# Patient Record
Sex: Male | Born: 1965 | Race: White | Hispanic: No | Marital: Married | Smoking: Current every day smoker
Health system: Southern US, Community
[De-identification: ages and names within clinical notes are randomized; demographics above are authoritative.]

## PROBLEM LIST (undated history)

## (undated) DIAGNOSIS — K0889 Other specified disorders of teeth and supporting structures: Secondary | ICD-10-CM

## (undated) DIAGNOSIS — IMO0001 Reserved for inherently not codable concepts without codable children: Secondary | ICD-10-CM

## (undated) DIAGNOSIS — R519 Headache, unspecified: Secondary | ICD-10-CM

## (undated) DIAGNOSIS — R51 Headache: Secondary | ICD-10-CM

## (undated) DIAGNOSIS — C801 Malignant (primary) neoplasm, unspecified: Secondary | ICD-10-CM

---

## 1999-09-16 HISTORY — PX: OTHER SURGICAL HISTORY: SHX169

## 2012-10-14 ENCOUNTER — Ambulatory Visit: Payer: Self-pay | Admitting: Psychiatry

## 2012-10-16 ENCOUNTER — Ambulatory Visit: Payer: Self-pay | Admitting: Psychiatry

## 2012-10-26 LAB — DRUG SCREEN, URINE
Barbiturates, Ur Screen: NEGATIVE (ref ?–200)
Benzodiazepine, Ur Scrn: NEGATIVE (ref ?–200)
Cocaine Metabolite,Ur ~~LOC~~: NEGATIVE (ref ?–300)
MDMA (Ecstasy)Ur Screen: NEGATIVE (ref ?–500)
Methadone, Ur Screen: NEGATIVE (ref ?–300)
Phencyclidine (PCP) Ur S: NEGATIVE (ref ?–25)

## 2012-11-13 ENCOUNTER — Ambulatory Visit: Payer: Self-pay | Admitting: Psychiatry

## 2012-12-03 LAB — RAPID URINE DRUG SCREEN, HOSP PERFORMED
Amphetamines, Ur Screen: NEGATIVE (ref ?–1000)
Barbiturates, Ur Screen: NEGATIVE (ref ?–200)
Benzodiazepine, Ur Scrn: NEGATIVE (ref ?–200)
Cannabinoid 50 Ng, Ur ~~LOC~~: NEGATIVE (ref ?–50)
Opiate, Ur Screen: NEGATIVE (ref ?–300)

## 2015-04-06 DIAGNOSIS — R109 Unspecified abdominal pain: Secondary | ICD-10-CM | POA: Insufficient documentation

## 2015-04-06 LAB — URINALYSIS COMPLETE WITH MICROSCOPIC (ARMC ONLY)
BACTERIA UA: NONE SEEN
Bilirubin Urine: NEGATIVE
GLUCOSE, UA: NEGATIVE mg/dL
Hgb urine dipstick: NEGATIVE
Ketones, ur: NEGATIVE mg/dL
LEUKOCYTES UA: NEGATIVE
Nitrite: NEGATIVE
PROTEIN: 30 mg/dL — AB
SPECIFIC GRAVITY, URINE: 1.027 (ref 1.005–1.030)
Squamous Epithelial / LPF: NONE SEEN
pH: 6 (ref 5.0–8.0)

## 2015-04-06 LAB — COMPREHENSIVE METABOLIC PANEL
ALBUMIN: 4 g/dL (ref 3.5–5.0)
ALT: 11 U/L — ABNORMAL LOW (ref 17–63)
AST: 15 U/L (ref 15–41)
Alkaline Phosphatase: 134 U/L — ABNORMAL HIGH (ref 38–126)
Anion gap: 9 (ref 5–15)
BUN: 17 mg/dL (ref 6–20)
CHLORIDE: 95 mmol/L — AB (ref 101–111)
CO2: 28 mmol/L (ref 22–32)
CREATININE: 1.23 mg/dL (ref 0.61–1.24)
Calcium: 8.8 mg/dL — ABNORMAL LOW (ref 8.9–10.3)
GFR calc non Af Amer: >60 mL/min (ref >60)
Glucose, Bld: 114 mg/dL — ABNORMAL HIGH (ref 65–99)
POTASSIUM: 4.4 mmol/L (ref 3.5–5.1)
Sodium: 132 mmol/L — ABNORMAL LOW (ref 135–145)
Total Bilirubin: 0.9 mg/dL (ref 0.3–1.2)
Total Protein: 7.8 g/dL (ref 6.5–8.1)

## 2015-04-06 LAB — CBC
HCT: 51.4 % (ref 40.0–52.0)
Hemoglobin: 16.9 g/dL (ref 13.0–18.0)
MCH: 27.6 pg (ref 26.0–34.0)
MCHC: 32.8 g/dL (ref 32.0–36.0)
MCV: 84.1 fL (ref 80.0–100.0)
Platelets: 333 10*3/uL (ref 150–440)
RBC: 6.11 MIL/uL — AB (ref 4.40–5.90)
RDW: 14.8 % — ABNORMAL HIGH (ref 11.5–14.5)
WBC: 13.3 10*3/uL — ABNORMAL HIGH (ref 3.8–10.6)

## 2015-04-06 LAB — LIPASE, BLOOD: Lipase: <10 U/L — ABNORMAL LOW (ref 22–51)

## 2015-04-06 MED ORDER — OXYCODONE-ACETAMINOPHEN 5-325 MG PO TABS
1.0000 | ORAL_TABLET | Freq: Once | ORAL | Status: AC
  Administered 2015-04-06: 1 via ORAL
  Filled 2015-04-06: qty 1

## 2015-04-06 MED ORDER — ONDANSETRON 4 MG PO TBDP
4.0000 mg | ORAL_TABLET | Freq: Once | ORAL | Status: AC | PRN
  Administered 2015-04-06: 4 mg via ORAL
  Filled 2015-04-06: qty 1

## 2015-04-06 NOTE — ED Notes (Signed)
Pt to ED c/o upper abd pain all week. Pt describes pain as burning in sensation.

## 2015-04-07 ENCOUNTER — Emergency Department
Admission: EM | Admit: 2015-04-07 | Discharge: 2015-04-07 | Discharge status: Against Medical Advice | Payer: Self-pay | Attending: Emergency Medicine | Admitting: Emergency Medicine

## 2015-04-09 ENCOUNTER — Telehealth: Payer: Self-pay | Admitting: Emergency Medicine

## 2015-04-09 NOTE — ED Notes (Signed)
Says he is feeling better today after taking laxative and having good bowel movement.  He does not have pcp.  i encouraged him to try to get pcp --he says he has insurance.  i told him to call insurance to see which doctors he can go to.  He agrees.

## 2016-02-06 ENCOUNTER — Emergency Department
Admission: EM | Admit: 2016-02-06 | Discharge: 2016-02-06 | Disposition: A | Discharge status: Home / Self Care | Payer: Self-pay | Attending: Emergency Medicine | Admitting: Emergency Medicine

## 2016-02-06 ENCOUNTER — Encounter: Payer: Self-pay | Admitting: Emergency Medicine

## 2016-02-06 DIAGNOSIS — R1030 Lower abdominal pain, unspecified: Secondary | ICD-10-CM | POA: Insufficient documentation

## 2016-02-06 DIAGNOSIS — Z5321 Procedure and treatment not carried out due to patient leaving prior to being seen by health care provider: Secondary | ICD-10-CM | POA: Insufficient documentation

## 2016-02-06 DIAGNOSIS — F17210 Nicotine dependence, cigarettes, uncomplicated: Secondary | ICD-10-CM | POA: Insufficient documentation

## 2016-02-06 LAB — COMPREHENSIVE METABOLIC PANEL
ALBUMIN: 3.1 g/dL — AB (ref 3.5–5.0)
ALK PHOS: 93 U/L (ref 38–126)
ALT: 14 U/L — AB (ref 17–63)
AST: 17 U/L (ref 15–41)
Anion gap: 7 (ref 5–15)
BUN: 11 mg/dL (ref 6–20)
CALCIUM: 8.7 mg/dL — AB (ref 8.9–10.3)
CO2: 27 mmol/L (ref 22–32)
CREATININE: 1.1 mg/dL (ref 0.61–1.24)
Chloride: 103 mmol/L (ref 101–111)
GFR calc non Af Amer: >60 mL/min (ref >60)
GLUCOSE: 103 mg/dL — AB (ref 65–99)
Potassium: 4.5 mmol/L (ref 3.5–5.1)
Sodium: 137 mmol/L (ref 135–145)
Total Bilirubin: 0.1 mg/dL — ABNORMAL LOW (ref 0.3–1.2)
Total Protein: 6.8 g/dL (ref 6.5–8.1)

## 2016-02-06 LAB — URINALYSIS COMPLETE WITH MICROSCOPIC (ARMC ONLY)
Bacteria, UA: NONE SEEN
Bilirubin Urine: NEGATIVE
Glucose, UA: NEGATIVE mg/dL
Hgb urine dipstick: NEGATIVE
Ketones, ur: NEGATIVE mg/dL
Leukocytes, UA: NEGATIVE
Nitrite: NEGATIVE
PH: 5 (ref 5.0–8.0)
PROTEIN: NEGATIVE mg/dL
RBC / HPF: NONE SEEN RBC/hpf (ref 0–5)
Specific Gravity, Urine: 1.017 (ref 1.005–1.030)

## 2016-02-06 LAB — CBC
HCT: 39.1 % — ABNORMAL LOW (ref 40.0–52.0)
Hemoglobin: 13.1 g/dL (ref 13.0–18.0)
MCH: 27 pg (ref 26.0–34.0)
MCHC: 33.5 g/dL (ref 32.0–36.0)
MCV: 80.5 fL (ref 80.0–100.0)
PLATELETS: 455 10*3/uL — AB (ref 150–440)
RBC: 4.86 MIL/uL (ref 4.40–5.90)
RDW: 14.9 % — ABNORMAL HIGH (ref 11.5–14.5)
WBC: 9.7 10*3/uL (ref 3.8–10.6)

## 2016-02-06 LAB — LIPASE, BLOOD: Lipase: 16 U/L (ref 11–51)

## 2016-02-06 NOTE — ED Notes (Signed)
Patient ambulatory to triage with steady gait, without difficulty or distress noted; pt reports lower abd pain x month with no accomp symptoms; last BM this am

## 2016-02-18 ENCOUNTER — Inpatient Hospital Stay
Admission: EM | Admit: 2016-02-18 | Discharge: 2016-02-20 | DRG: 375 | Disposition: A | Discharge status: Home / Self Care | Payer: Commercial Managed Care - PPO | Attending: Internal Medicine | Admitting: Internal Medicine

## 2016-02-18 ENCOUNTER — Emergency Department: Payer: Commercial Managed Care - PPO

## 2016-02-18 ENCOUNTER — Encounter: Payer: Self-pay | Admitting: Emergency Medicine

## 2016-02-18 DIAGNOSIS — F419 Anxiety disorder, unspecified: Secondary | ICD-10-CM | POA: Diagnosis present

## 2016-02-18 DIAGNOSIS — C801 Malignant (primary) neoplasm, unspecified: Secondary | ICD-10-CM

## 2016-02-18 DIAGNOSIS — R140 Abdominal distension (gaseous): Secondary | ICD-10-CM | POA: Diagnosis present

## 2016-02-18 DIAGNOSIS — Z8249 Family history of ischemic heart disease and other diseases of the circulatory system: Secondary | ICD-10-CM

## 2016-02-18 DIAGNOSIS — Z800 Family history of malignant neoplasm of digestive organs: Secondary | ICD-10-CM

## 2016-02-18 DIAGNOSIS — R451 Restlessness and agitation: Secondary | ICD-10-CM | POA: Diagnosis present

## 2016-02-18 DIAGNOSIS — F17210 Nicotine dependence, cigarettes, uncomplicated: Secondary | ICD-10-CM | POA: Diagnosis present

## 2016-02-18 DIAGNOSIS — Z807 Family history of other malignant neoplasms of lymphoid, hematopoietic and related tissues: Secondary | ICD-10-CM

## 2016-02-18 DIAGNOSIS — R1900 Intra-abdominal and pelvic swelling, mass and lump, unspecified site: Secondary | ICD-10-CM | POA: Diagnosis not present

## 2016-02-18 DIAGNOSIS — R188 Other ascites: Secondary | ICD-10-CM | POA: Diagnosis present

## 2016-02-18 DIAGNOSIS — C786 Secondary malignant neoplasm of retroperitoneum and peritoneum: Secondary | ICD-10-CM | POA: Diagnosis present

## 2016-02-18 DIAGNOSIS — K5900 Constipation, unspecified: Secondary | ICD-10-CM | POA: Diagnosis present

## 2016-02-18 DIAGNOSIS — R109 Unspecified abdominal pain: Secondary | ICD-10-CM | POA: Diagnosis not present

## 2016-02-18 LAB — COMPREHENSIVE METABOLIC PANEL
ALBUMIN: 2.8 g/dL — AB (ref 3.5–5.0)
ALT: 18 U/L (ref 17–63)
ANION GAP: 9 (ref 5–15)
AST: 16 U/L (ref 15–41)
Alkaline Phosphatase: 117 U/L (ref 38–126)
BUN: 11 mg/dL (ref 6–20)
CHLORIDE: 99 mmol/L — AB (ref 101–111)
CO2: 26 mmol/L (ref 22–32)
Calcium: 8.3 mg/dL — ABNORMAL LOW (ref 8.9–10.3)
Creatinine, Ser: 1.14 mg/dL (ref 0.61–1.24)
GFR calc non Af Amer: >60 mL/min (ref >60)
Glucose, Bld: 101 mg/dL — ABNORMAL HIGH (ref 65–99)
POTASSIUM: 4.5 mmol/L (ref 3.5–5.1)
Sodium: 134 mmol/L — ABNORMAL LOW (ref 135–145)
Total Bilirubin: 0.7 mg/dL (ref 0.3–1.2)
Total Protein: 6.5 g/dL (ref 6.5–8.1)

## 2016-02-18 LAB — CBC
HCT: 39.4 % — ABNORMAL LOW (ref 40.0–52.0)
HEMOGLOBIN: 13.1 g/dL (ref 13.0–18.0)
MCH: 26.5 pg (ref 26.0–34.0)
MCHC: 33.2 g/dL (ref 32.0–36.0)
MCV: 79.7 fL — ABNORMAL LOW (ref 80.0–100.0)
PLATELETS: 453 10*3/uL — AB (ref 150–440)
RBC: 4.94 MIL/uL (ref 4.40–5.90)
RDW: 14.9 % — AB (ref 11.5–14.5)
WBC: 8.7 10*3/uL (ref 3.8–10.6)

## 2016-02-18 LAB — PROTIME-INR
INR: 1.35
PROTHROMBIN TIME: 16.8 s — AB (ref 11.4–15.0)

## 2016-02-18 LAB — LIPASE, BLOOD: Lipase: 14 U/L (ref 11–51)

## 2016-02-18 LAB — URINALYSIS COMPLETE WITH MICROSCOPIC (ARMC ONLY)
Bacteria, UA: NONE SEEN
Bilirubin Urine: NEGATIVE
GLUCOSE, UA: NEGATIVE mg/dL
HGB URINE DIPSTICK: NEGATIVE
KETONES UR: NEGATIVE mg/dL
LEUKOCYTES UA: NEGATIVE
NITRITE: NEGATIVE
Protein, ur: NEGATIVE mg/dL
SPECIFIC GRAVITY, URINE: 1.046 — AB (ref 1.005–1.030)
Squamous Epithelial / LPF: NONE SEEN
pH: 5 (ref 5.0–8.0)

## 2016-02-18 LAB — APTT: APTT: 34 s (ref 24–36)

## 2016-02-18 MED ORDER — DIATRIZOATE MEGLUMINE & SODIUM 66-10 % PO SOLN
15.0000 mL | Freq: Once | ORAL | Status: AC
  Administered 2016-02-18: 15 mL via ORAL

## 2016-02-18 MED ORDER — HEPARIN SODIUM (PORCINE) 5000 UNIT/ML IJ SOLN
5000.0000 [IU] | Freq: Three times a day (TID) | INTRAMUSCULAR | Status: DC
  Administered 2016-02-18 – 2016-02-20 (×5): 5000 [IU] via SUBCUTANEOUS
  Filled 2016-02-18 (×5): qty 1

## 2016-02-18 MED ORDER — NICOTINE 21 MG/24HR TD PT24
21.0000 mg | MEDICATED_PATCH | Freq: Every day | TRANSDERMAL | Status: DC
  Administered 2016-02-18 – 2016-02-20 (×3): 21 mg via TRANSDERMAL
  Filled 2016-02-18 (×3): qty 1

## 2016-02-18 MED ORDER — ACETAMINOPHEN 325 MG PO TABS: 650.0000 mg | ORAL_TABLET | Freq: Four times a day (QID) | ORAL | Status: DC | PRN

## 2016-02-18 MED ORDER — OXYCODONE HCL 5 MG PO TABS
5.0000 mg | ORAL_TABLET | ORAL | Status: DC | PRN
  Administered 2016-02-18 – 2016-02-19 (×4): 5 mg via ORAL
  Filled 2016-02-18 (×4): qty 1

## 2016-02-18 MED ORDER — ONDANSETRON HCL 4 MG/2ML IJ SOLN
4.0000 mg | Freq: Four times a day (QID) | INTRAMUSCULAR | Status: DC | PRN
  Administered 2016-02-18: 4 mg via INTRAVENOUS
  Filled 2016-02-18: qty 2

## 2016-02-18 MED ORDER — IOPAMIDOL (ISOVUE-300) INJECTION 61%
100.0000 mL | Freq: Once | INTRAVENOUS | Status: AC | PRN
  Administered 2016-02-18: 100 mL via INTRAVENOUS

## 2016-02-18 MED ORDER — ACETAMINOPHEN 650 MG RE SUPP: 650.0000 mg | Freq: Four times a day (QID) | RECTAL | Status: DC | PRN

## 2016-02-18 MED ORDER — MORPHINE SULFATE (PF) 2 MG/ML IV SOLN
2.0000 mg | INTRAVENOUS | Status: DC | PRN
  Administered 2016-02-18 (×2): 2 mg via INTRAVENOUS
  Filled 2016-02-18 (×2): qty 1

## 2016-02-18 MED ORDER — ONDANSETRON HCL 4 MG/2ML IJ SOLN
4.0000 mg | Freq: Once | INTRAMUSCULAR | Status: AC
  Administered 2016-02-18: 4 mg via INTRAVENOUS
  Filled 2016-02-18: qty 2

## 2016-02-18 MED ORDER — MORPHINE SULFATE (PF) 4 MG/ML IV SOLN
4.0000 mg | Freq: Once | INTRAVENOUS | Status: AC
  Administered 2016-02-18: 4 mg via INTRAVENOUS
  Filled 2016-02-18: qty 1

## 2016-02-18 MED ORDER — ONDANSETRON HCL 4 MG PO TABS: 4.0000 mg | ORAL_TABLET | Freq: Four times a day (QID) | ORAL | Status: DC | PRN

## 2016-02-18 NOTE — ED Provider Notes (Signed)
[facility] Emergency Department Provider Note  ____________________________________________    I have reviewed the triage vital signs and the nursing notes.   HISTORY  Chief Complaint Abdominal Pain    HPI Andrew Ibarra is a 50 y.o. male who presents with abdominal pain for approximately last 2 months. He notes worsening distention of his abdomen feeling of tightness. He denies nausea or vomiting. He is having stools although does note problems with constipation which is unusual for him. Denies fevers or chills. No recent travel. He reports the pain is moderate to severe but better if he is resting.     History reviewed. No pertinent past medical history.  There are no active problems to display for this patient.   History reviewed. No pertinent past surgical history.  No current outpatient prescriptions on file.  Allergies Review of patient's allergies indicates no known allergies.  No family history on file.  Social History Social History  Substance Use Topics  . Smoking status: Current Every Day Smoker -- 0.50 packs/day    Types: Cigarettes  . Smokeless tobacco: None  . Alcohol Use: No    Review of Systems  Constitutional: Negative for fever. Eyes: Negative for redness ENT: Negative for sore throat Cardiovascular: Negative for chest pain Respiratory: Negative for shortness of breath. Gastrointestinal: As above Genitourinary: Negative for dysuria. Musculoskeletal: Negative for back pain. Skin: Negative for rash. Neurological: Negative for focal weakness Psychiatric: Positive for anxiety  ____________________________________________   PHYSICAL EXAM:  VITAL SIGNS: ED Triage Vitals  Enc Vitals Group     BP 02/18/16 1053 135/93 mmHg     Pulse Rate 02/18/16 1053 88     Resp 02/18/16 1053 22     Temp 02/18/16 1053 98.1 F (36.7 C)     Temp Source 02/18/16 1053 Oral     SpO2 02/18/16 1053 98 %     Weight 02/18/16 1053 260  lb (117.935 kg)     Height 02/18/16 1053 5\' 9"  (1.753 m)     Head Cir --      Peak Flow --      Pain Score 02/18/16 1054 7     Pain Loc --      Pain Edu? --      Excl. in [place]? --      Constitutional: Alert and oriented. Well appearing and in no distress.  Eyes: Conjunctivae are normal. No erythema or injection ENT   Head: Normocephalic and atraumatic.   Mouth/Throat: Mucous membranes are moist. Cardiovascular: Normal rate, regular rhythm. Normal and symmetric distal pulses are present in the upper extremities. No murmurs or rubs  Respiratory: Normal respiratory effort without tachypnea nor retractions. Breath sounds are clear and equal bilaterally.  Gastrointestinal: Distended abdomen, positive fluid wave, diffusely tender, non-peritoneal.. There is no CVA tenderness. Genitourinary: deferred Musculoskeletal: Nontender with normal range of motion in all extremities. No lower extremity tenderness nor edema. Neurologic:  Normal speech and language. No gross focal neurologic deficits are appreciated. Skin:  Skin is warm, dry and intact. No rash noted. Psychiatric: Mood and affect are normal. Patient exhibits appropriate insight and judgment.  ____________________________________________    LABS (pertinent positives/negatives)  Labs Reviewed  CBC - Abnormal; Notable for the following:    HCT 39.4 (*)    MCV 79.7 (*)    RDW 14.9 (*)    Platelets 453 (*)    All other components within normal limits  COMPREHENSIVE METABOLIC PANEL - Abnormal; Notable for the following:  Sodium 134 (*)    Chloride 99 (*)    Glucose, Bld 101 (*)    Calcium 8.3 (*)    Albumin 2.8 (*)    All other components within normal limits  LIPASE, BLOOD  URINALYSIS COMPLETEWITH MICROSCOPIC ([facility] ONLY)    ____________________________________________   EKG  ED ECG REPORT I, Lavonia Drafts, the attending physician, personally viewed and interpreted this ECG.  Date: 02/18/2016 EKG Time: 11:43  AM Rate: 92 Rhythm: normal sinus rhythm QRS Axis: normal Intervals: Slightly prolonged QTC ST/T Wave abnormalities: normal Conduction Disturbances: none    ____________________________________________    RADIOLOGY  Called by radiologist, concern for peritoneal carcinomatosis on CT scan  ____________________________________________   PROCEDURES  Procedure(s) performed: none  Critical Care performed: none  ____________________________________________   INITIAL IMPRESSION / ASSESSMENT AND PLAN / ED COURSE  Pertinent labs & imaging results that were available during my care of the patient were reviewed by me and considered in my medical decision making (see chart for details).  Patient with recent abdomen, diffusely tender. X-rays are unremarkable. We will give morphine and Zofran IV as he continues to have discomfort. We will obtain CT abdomen and pelvis  ----------------------------------------- 2:02 PM on 02/18/2016 -----------------------------------------  Discussed CT findings with patient and his family, they are aware this is very likely cancer. Discussed with Dr. Grayland Ormond of oncology who will see the patient the hospital.  ____________________________________________   FINAL CLINICAL IMPRESSION(S) / ED DIAGNOSES  Final diagnoses:  Abdominal bloating  Peritoneal carcinomatosis ([place])  Ascites          Lavonia Drafts, MD 02/18/16 1404

## 2016-02-18 NOTE — ED Notes (Signed)
Pt presents with lower abd pain x2 mths.

## 2016-02-18 NOTE — H&P (Signed)
[place] [facility] [place] NAME: Andrew Ibarra    MR#:  [id number]  DATE OF BIRTH:  [birth year]  DATE OF ADMISSION:  02/18/2016  PRIMARY CARE PHYSICIAN: Nathaneil Canary, PA-C   REQUESTING/REFERRING PHYSICIAN: Dr. Lavonia Drafts  CHIEF COMPLAINT:   Chief Complaint  Patient presents with  . Abdominal Pain    HISTORY OF PRESENT ILLNESS:  Andrew Ibarra  is a 50 y.o. male with no significant past medical history comes to emergency room secondary to worsening abdominal distention and also abdominal pain. Abdominal distention has been going on for almost a month now. Gradually increasing in size. He feels like gas is trapped in his lower quadrants. Has had irregular bowel movements for almost 2 years now. Mostly constipated. Denies any rectal bleeding or dark stools or blood in the stools. No nausea or vomiting. Worsening tightness in his belly and pain made him come to the emergency room today. Labs are within normal limits. CT of the abdomen showing large volume ascites with extensive peritoneal and omental nodularity concerning for metastases and thickening in ascending colon.   PAST MEDICAL HISTORY:  History reviewed. No pertinent past medical history.  PAST SURGICAL HISTORY:   Past Surgical History  Procedure Laterality Date  . Right foot surgery      SOCIAL HISTORY:   Social History  Substance Use Topics  . Smoking status: Current Every Day Smoker -- 0.50 packs/day    Types: Cigarettes  . Smokeless tobacco: Not on file     Comment: has more than 30 pack years history  . Alcohol Use: No    FAMILY HISTORY:   Family History  Problem Relation Age of Onset  . Pancreatic cancer Maternal Grandfather   . Lymphoma Maternal Grandmother   . CAD Father     DRUG ALLERGIES:  No Known Allergies  REVIEW OF SYSTEMS:   Review of Systems  Constitutional: Positive for malaise/fatigue. Negative for fever, chills and weight loss.   HENT: Negative for ear discharge, ear pain, hearing loss and nosebleeds.   Eyes: Negative for blurred vision, double vision and photophobia.  Respiratory: Positive for shortness of breath. Negative for cough, hemoptysis and wheezing.   Cardiovascular: Negative for chest pain, palpitations, orthopnea and leg swelling.  Gastrointestinal: Positive for abdominal pain and constipation. Negative for heartburn, nausea, vomiting, diarrhea and melena.  Genitourinary: Negative for dysuria, urgency, frequency and hematuria.  Musculoskeletal: Negative for myalgias and neck pain.  Skin: Negative for rash.  Neurological: Negative for dizziness, tingling, sensory change, speech change, focal weakness and headaches.  Endo/Heme/Allergies: Does not bruise/bleed easily.  Psychiatric/Behavioral: Negative for depression.    MEDICATIONS AT HOME:   Prior to Admission medications   Not on File      VITAL SIGNS:  Blood pressure 154/86, pulse 89, temperature 98.1 F (36.7 C), temperature source Oral, resp. rate 13, height 5\' 9"  (1.753 m), weight 117.935 kg (260 lb), SpO2 98 %.  PHYSICAL EXAMINATION:   Physical Exam  GENERAL:  50 y.o.-year-old obese patient lying in the bed with no acute distress.  EYES: Pupils equal, round, reactive to light and accommodation. No scleral icterus. Extraocular muscles intact.  HEENT: Head atraumatic, normocephalic. Oropharynx and nasopharynx clear.  NECK:  Supple, no jugular venous distention. No thyroid enlargement, no tenderness.  LUNGS: Normal breath sounds bilaterally, no wheezing, rales,rhonchi or crepitation. No use of accessory muscles of respiration. Decreased bibasilar breath sounds. CARDIOVASCULAR: S1, S2 normal. No murmurs, rubs, or gallops.  ABDOMEN: Very distended abdomen, unable to palpate any organs, tight with discomfort on palpation. Bowel sounds present.  EXTREMITIES: No pedal edema, cyanosis, or clubbing.  NEUROLOGIC: Cranial nerves II through XII are  intact. Muscle strength 5/5 in all extremities. Sensation intact. Gait not checked.  PSYCHIATRIC: The patient is alert and oriented x 3.  SKIN: No obvious rash, lesion, or ulcer.   LABORATORY PANEL:   CBC  Recent Labs Lab 02/18/16 1100  WBC 8.7  HGB 13.1  HCT 39.4*  PLT 453*   ------------------------------------------------------------------------------------------------------------------  Chemistries   Recent Labs Lab 02/18/16 1100  NA 134*  K 4.5  CL 99*  CO2 26  GLUCOSE 101*  BUN 11  CREATININE 1.14  CALCIUM 8.3*  AST 16  ALT 18  ALKPHOS 117  BILITOT 0.7   ------------------------------------------------------------------------------------------------------------------  Cardiac Enzymes No results for input(s): TROPONINI in the last 168 hours. ------------------------------------------------------------------------------------------------------------------  RADIOLOGY:  Ct Abdomen Pelvis W Contrast  02/18/2016  CLINICAL DATA:  Shortness of breath, abdominal distention for 1 month. History of ventral hernia repair EXAM: CT ABDOMEN AND PELVIS WITH CONTRAST TECHNIQUE: Multidetector CT imaging of the abdomen and pelvis was performed using the standard protocol following bolus administration of intravenous contrast. CONTRAST:  138mL ISOVUE-300 IOPAMIDOL (ISOVUE-300) INJECTION 61% COMPARISON:  Plain films 02/18/2016 FINDINGS: Lower chest: Linear atelectasis in the lung bases. Heart is normal size. No effusions. Hepatobiliary: No focal hepatic abnormality. Gallbladder unremarkable. Pancreas: No focal abnormality or ductal dilatation. Spleen: No focal abnormality.  Normal size. Adrenals/Urinary Tract: No adrenal abnormality. No focal renal abnormality. No stones or hydronephrosis. Urinary bladder is unremarkable. Stomach/Bowel: There is a questionable area of abnormal wall thickening within the ascending colon, best seen on axial image 48 and coronal image 119. Given the  peritoneal findings described below, cannot exclude colonic malignancy. Vascular/Lymphatic: Aorta and iliac vessels are normal caliber. Shotty retroperitoneal lymph nodes. Reproductive: No visible focal abnormality. Other: Large volume ascites in the abdomen and pelvis. There is extensive peritoneal and omental nodularity throughout the abdomen and pelvis. 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 Musculoskeletal: No acute bony abnormality or focal bone lesion. IMPRESSION: Large volume ascites with extensive peritoneal and omental nodularity concerning for peritoneal carcinomatosis. There is an area of apparent abnormal wall thickening within the ascending colon which could reflect colon cancer and source of peritoneal disease. These results were called by telephone at the time of interpretation on 02/18/2016 at 1:46 pm to Dr. Lavonia Drafts , who verbally acknowledged these results. Electronically Signed   By: Rolm Baptise M.D.   On: 02/18/2016 13:47   Dg Abd 2 Views  02/18/2016  CLINICAL DATA:  Lower abdominal pain for 1 month. Abdominal distention increased pain over the past 3 days. EXAM: ABDOMEN - 2 VIEW COMPARISON:  None. FINDINGS: The bowel gas pattern is normal. There is no evidence of free air. No radio-opaque calculi or other significant radiographic abnormality is seen. IMPRESSION: No acute abnormality. Electronically Signed   By: Inge Rise M.D.   On: 02/18/2016 10:49    EKG:   Orders placed or performed during the hospital encounter of 02/18/16  . EKG 12-Lead  . EKG 12-Lead  . ED EKG  . ED EKG    IMPRESSION AND PLAN:   Andrew Ibarra  is a 50 y.o. male with no significant past medical history comes to emergency room secondary to worsening abdominal distention and also abdominal pain.  #1 Abdominal distention- large volume ascites- could be malignant ascites - peritoneal carcinomatosis and ascending colon  thickening- no previous colonoscopy, no family h/o colon cancer - US  guided paracentesis requested- gram stain, cultures and Cytology - CEA and CA 19-9 ordered - GI and oncology consults - pain and nausea meds  #2 Tobacco use disorder- nicotine patch  #3 DVT Prophylaxis- on SQ heparin, if doesn't need any procedures- change to lovenox   All the records are reviewed and case discussed with ED provider. Management plans discussed with the patient, family and they are in agreement.  CODE STATUS: Full Code  TOTAL TIME TAKING CARE OF THIS PATIENT: 50 minutes.    Makalya Nave M.D on 02/18/2016 at 3:06 PM  Between 7am to 6pm - Pager - [phone]  After 6pm go to [email or web address] - password [email or web address] [facility]  Office  [phone]  CC: Primary care physician; Nathaneil Canary, PA-C

## 2016-02-19 ENCOUNTER — Inpatient Hospital Stay: Payer: Commercial Managed Care - PPO

## 2016-02-19 DIAGNOSIS — Z79899 Other long term (current) drug therapy: Secondary | ICD-10-CM

## 2016-02-19 DIAGNOSIS — Z800 Family history of malignant neoplasm of digestive organs: Secondary | ICD-10-CM

## 2016-02-19 DIAGNOSIS — R109 Unspecified abdominal pain: Secondary | ICD-10-CM

## 2016-02-19 DIAGNOSIS — Z87891 Personal history of nicotine dependence: Secondary | ICD-10-CM

## 2016-02-19 DIAGNOSIS — Z807 Family history of other malignant neoplasms of lymphoid, hematopoietic and related tissues: Secondary | ICD-10-CM

## 2016-02-19 DIAGNOSIS — R419 Unspecified symptoms and signs involving cognitive functions and awareness: Secondary | ICD-10-CM

## 2016-02-19 DIAGNOSIS — R140 Abdominal distension (gaseous): Secondary | ICD-10-CM

## 2016-02-19 DIAGNOSIS — R188 Other ascites: Secondary | ICD-10-CM

## 2016-02-19 DIAGNOSIS — R1900 Intra-abdominal and pelvic swelling, mass and lump, unspecified site: Secondary | ICD-10-CM

## 2016-02-19 LAB — BASIC METABOLIC PANEL
ANION GAP: 7 (ref 5–15)
BUN: 10 mg/dL (ref 6–20)
CHLORIDE: 101 mmol/L (ref 101–111)
CO2: 24 mmol/L (ref 22–32)
CREATININE: 0.95 mg/dL (ref 0.61–1.24)
Calcium: 8 mg/dL — ABNORMAL LOW (ref 8.9–10.3)
GFR calc non Af Amer: >60 mL/min (ref >60)
Glucose, Bld: 100 mg/dL — ABNORMAL HIGH (ref 65–99)
POTASSIUM: 4.3 mmol/L (ref 3.5–5.1)
SODIUM: 132 mmol/L — AB (ref 135–145)

## 2016-02-19 LAB — AMYLASE, BODY FLUID: AMYLASE FL: 8 U/L

## 2016-02-19 LAB — PROTEIN, BODY FLUID: TOTAL PROTEIN, FLUID: 4 g/dL

## 2016-02-19 LAB — CEA: CEA: 3.1 ng/mL (ref 0.0–4.7)

## 2016-02-19 LAB — BODY FLUID CELL COUNT WITH DIFFERENTIAL
Lymphs, Fluid: 35 %
MONOCYTE-MACROPHAGE-SEROUS FLUID: 59 %
Neutrophil Count, Fluid: 6 %
WBC FLUID: 979 uL

## 2016-02-19 LAB — CBC
HCT: 34.9 % — ABNORMAL LOW (ref 40.0–52.0)
HEMOGLOBIN: 11.8 g/dL — AB (ref 13.0–18.0)
MCH: 27.2 pg (ref 26.0–34.0)
MCHC: 33.7 g/dL (ref 32.0–36.0)
MCV: 80.9 fL (ref 80.0–100.0)
PLATELETS: 430 10*3/uL (ref 150–440)
RBC: 4.32 MIL/uL — AB (ref 4.40–5.90)
RDW: 14.5 % (ref 11.5–14.5)
WBC: 7.3 10*3/uL (ref 3.8–10.6)

## 2016-02-19 LAB — LACTATE DEHYDROGENASE, PLEURAL OR PERITONEAL FLUID: LD, Fluid: 178 U/L — ABNORMAL HIGH (ref 3–23)

## 2016-02-19 LAB — GLUCOSE, PERITONEAL FLUID: Glucose, Peritoneal Fluid: 95 mg/dL

## 2016-02-19 LAB — CA 19-9 (SERIAL): CA 19-9: 13 U/mL (ref 0–35)

## 2016-02-19 MED ORDER — ALBUMIN HUMAN 25 % IV SOLN
12.5000 g | Freq: Once | INTRAVENOUS | Status: AC | PRN
  Administered 2016-02-19: 12.5 g via INTRAVENOUS
  Filled 2016-02-19: qty 50

## 2016-02-19 MED ORDER — LORAZEPAM 0.5 MG PO TABS
0.5000 mg | ORAL_TABLET | ORAL | Status: AC
Start: 2016-02-19 — End: 2016-02-19
  Administered 2016-02-19: 0.5 mg via ORAL
  Filled 2016-02-19: qty 1

## 2016-02-19 MED ORDER — OXYCODONE HCL 5 MG PO TABS
10.0000 mg | ORAL_TABLET | Freq: Once | ORAL | Status: AC
  Administered 2016-02-19: 10 mg via ORAL
  Filled 2016-02-19: qty 2

## 2016-02-19 MED ORDER — LORAZEPAM 0.5 MG PO TABS
0.5000 mg | ORAL_TABLET | Freq: Once | ORAL | Status: AC
  Administered 2016-02-19: 0.5 mg via ORAL
  Filled 2016-02-19: qty 1

## 2016-02-19 NOTE — Progress Notes (Signed)
[place] [facility] [place] NAME: Andrew Ibarra    MR#:  [id number]  DATE OF BIRTH:  19-Oct-1965  SUBJECTIVE:  CHIEF COMPLAINT:   Chief Complaint  Patient presents with  . Abdominal Pain   -Very restless and anxious about his paracentesis. -Family worried about the diagnosis. Scheduled for paracentesis today. Complaints of abdominal pain  REVIEW OF SYSTEMS:  Review of Systems  Constitutional: Negative for fever and chills.  HENT: Negative for ear discharge, ear pain and tinnitus.   Respiratory: Negative for cough, shortness of breath and wheezing.   Cardiovascular: Negative for chest pain and palpitations.  Gastrointestinal: Positive for abdominal pain. Negative for nausea, vomiting, diarrhea and constipation.  Genitourinary: Negative for dysuria, urgency and frequency.  Musculoskeletal: Negative for myalgias.  Neurological: Negative for dizziness, speech change, focal weakness, seizures and headaches.  Psychiatric/Behavioral: Negative for depression. The patient is nervous/anxious.     DRUG ALLERGIES:  No Known Allergies  VITALS:  Blood pressure 156/89, pulse 93, temperature 97.7 F (36.5 C), temperature source Oral, resp. rate 22, height 5\' 9"  (1.753 m), weight 117.935 kg (260 lb), SpO2 94 %.  PHYSICAL EXAMINATION:  Physical Exam  GENERAL: 50 y.o.-year-old obese patient lying in the bed with no acute distress.  EYES: Pupils equal, round, reactive to light and accommodation. No scleral icterus. Extraocular muscles intact.  HEENT: Head atraumatic, normocephalic. Oropharynx and nasopharynx clear.  NECK: Supple, no jugular venous distention. No thyroid enlargement, no tenderness.  LUNGS: Normal breath sounds bilaterally, no wheezing, rales,rhonchi or crepitation. No use of accessory muscles of respiration. Decreased bibasilar breath sounds. CARDIOVASCULAR: S1, S2 normal. No murmurs, rubs, or gallops.  ABDOMEN: Very distended  abdomen, unable to palpate any organs, tight with discomfort on palpation. Bowel sounds present.  EXTREMITIES: No pedal edema, cyanosis, or clubbing.  NEUROLOGIC: Cranial nerves II through XII are intact. Muscle strength 5/5 in all extremities. Sensation intact. Gait not checked.  PSYCHIATRIC: The patient is alert and oriented x 3.  SKIN: No obvious rash, lesion, or ulcer.    LABORATORY PANEL:   CBC  Recent Labs Lab 02/19/16 0455  WBC 7.3  HGB 11.8*  HCT 34.9*  PLT 430   ------------------------------------------------------------------------------------------------------------------  Chemistries   Recent Labs Lab 02/18/16 1100 02/19/16 0455  NA 134* 132*  K 4.5 4.3  CL 99* 101  CO2 26 24  GLUCOSE 101* 100*  BUN 11 10  CREATININE 1.14 0.95  CALCIUM 8.3* 8.0*  AST 16  --   ALT 18  --   ALKPHOS 117  --   BILITOT 0.7  --    ------------------------------------------------------------------------------------------------------------------  Cardiac Enzymes No results for input(s): TROPONINI in the last 168 hours. ------------------------------------------------------------------------------------------------------------------  RADIOLOGY:  Ct Abdomen Pelvis W Contrast  02/18/2016  CLINICAL DATA:  Shortness of breath, abdominal distention for 1 month. History of ventral hernia repair EXAM: CT ABDOMEN AND PELVIS WITH CONTRAST TECHNIQUE: Multidetector CT imaging of the abdomen and pelvis was performed using the standard protocol following bolus administration of intravenous contrast. CONTRAST:  159mL ISOVUE-300 IOPAMIDOL (ISOVUE-300) INJECTION 61% COMPARISON:  Plain films 02/18/2016 FINDINGS: Lower chest: Linear atelectasis in the lung bases. Heart is normal size. No effusions. Hepatobiliary: No focal hepatic abnormality. Gallbladder unremarkable. Pancreas: No focal abnormality or ductal dilatation. Spleen: No focal abnormality.  Normal size. Adrenals/Urinary Tract: No  adrenal abnormality. No focal renal abnormality. No stones or hydronephrosis. Urinary bladder is unremarkable. Stomach/Bowel: There is a questionable area of abnormal wall thickening within the  ascending colon, best seen on axial image 48 and coronal image 119. Given the peritoneal findings described below, cannot exclude colonic malignancy. Vascular/Lymphatic: Aorta and iliac vessels are normal caliber. Shotty retroperitoneal lymph nodes. Reproductive: No visible focal abnormality. Other: Large volume ascites in the abdomen and pelvis. There is extensive peritoneal and omental nodularity throughout the abdomen and pelvis. 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 Musculoskeletal: No acute bony abnormality or focal bone lesion. IMPRESSION: Large volume ascites with extensive peritoneal and omental nodularity concerning for peritoneal carcinomatosis. There is an area of apparent abnormal wall thickening within the ascending colon which could reflect colon cancer and source of peritoneal disease. These results were called by telephone at the time of interpretation on 02/18/2016 at 1:46 pm to Dr. Lavonia Drafts , who verbally acknowledged these results. Electronically Signed   By: Rolm Baptise M.D.   On: 02/18/2016 13:47   Dg Abd 2 Views  02/18/2016  CLINICAL DATA:  Lower abdominal pain for 1 month. Abdominal distention increased pain over the past 3 days. EXAM: ABDOMEN - 2 VIEW COMPARISON:  None. FINDINGS: The bowel gas pattern is normal. There is no evidence of free air. No radio-opaque calculi or other significant radiographic abnormality is seen. IMPRESSION: No acute abnormality. Electronically Signed   By: Inge Rise M.D.   On: 02/18/2016 10:49    EKG:   Orders placed or performed during the hospital encounter of 02/18/16  . EKG 12-Lead  . EKG 12-Lead  . ED EKG  . ED EKG    ASSESSMENT AND PLAN:   Halvor Rilley is a 50 y.o. male with no significant past medical history comes to  emergency room secondary to worsening abdominal distention and also abdominal pain.  #1 Abdominal distention- large volume ascites- could be malignant ascites - peritoneal carcinomatosis and ascending colon thickening- no previous colonoscopy, no family h/o colon cancer - US guided paracentesis requested- gram stain, cultures and Cytology - CEA and CA 19-9 ordered - GI and oncology consults - pain and nausea meds  #2 Tobacco use disorder- nicotine patch  #3 DVT Prophylaxis- on SQ heparin, if doesn't need any more procedures- change to lovenox  #4 anxiety-one dose of Ativan now   Family present at bedside and updated.  All the records are reviewed and case discussed with Care Management/Social Workerr. Management plans discussed with the patient, family and they are in agreement.  CODE STATUS: Full Code  TOTAL TIME TAKING CARE OF THIS PATIENT: 38 minutes.   POSSIBLE D/C IN 1-2 DAYS, DEPENDING ON CLINICAL CONDITION.   Gladstone Lighter M.D on 02/19/2016 at 11:31 AM  Between 7am to 6pm - Pager - [phone]  After 6pm go to [email or web address] - password [email or web address] [facility]  Office  [phone]  CC: Primary care physician; Nathaneil Canary, PA-C

## 2016-02-19 NOTE — Plan of Care (Signed)
Problem: Education: Goal: Knowledge of [facility] Education information/materials will improve Outcome: Progressing Pt oriented to room and equipment, call bell within reach. Family at bedside, npo past mn for paracentesis in AM. GI and Onc consult in am.  Problem: Pain Managment: Goal: General experience of comfort will improve Outcome: Not Progressing Pt with c/o pain 6-8/10 to lower abd, prn pain meds given per md order.   Problem: Activity: Goal: Risk for activity intolerance will decrease Outcome: Progressing Pt ambulated to bathroom with steady gait, low risk for falls.   Problem: Nutrition: Goal: Adequate nutrition will be maintained Outcome: Progressing Pt npo past mn for procedure in am.  Problem: Bowel/Gastric: Goal: Will not experience complications related to bowel motility Outcome: Not Progressing Pt with abd distention, onc and GI to consult in am. Pt denies N/V, see chart for last BM.

## 2016-02-19 NOTE — Plan of Care (Signed)
Problem: Pain Managment: Goal: General experience of comfort will improve Outcome: Progressing Pt feeling significantly better and almost pain free after ultrasound paracentesis.  Dola Argyle, RN

## 2016-02-19 NOTE — Consult Note (Addendum)
[place]  [phone] [phone]  ID: Andrew Ibarra OB: November 06, 1965  MR#: [id number]  [id number]  Patient Care Team: Gunnar Bulla as PCP - General (Physician Assistant) Clent Jacks, RN as Registered Nurse  CHIEF COMPLAINT:  Chief Complaint  Patient presents with  . Abdominal Pain    INTERVAL HISTORY: Patient is a 50 year old male who was initially usual state of health until approximately one month ago when he noticed increased abdominal pain and swelling. His pain and distention significantly worse and upon evaluation in the emergency room with CT scan was found to have significant amount of ascites as well as omental caking consistent with underlying malignancy. Earlier today patient underwent a greater than 10 L paracentesis with significant improvement of his symptoms. He has no neurologic complaints. He denies any fevers or illnesses. He denies any chest pain or shortness of breath. He has a fair appetite, but denies weight loss. He denies any nausea, vomiting, constipation, or diarrhea. He has no urinary complaints. Patient otherwise feels well and offers no further specific complaints.  REVIEW OF SYSTEMS:   Review of Systems  Constitutional: Negative.  Negative for fever, weight loss and malaise/fatigue.  Respiratory: Negative.  Negative for cough and shortness of breath.   Cardiovascular: Negative.  Negative for chest pain.  Gastrointestinal: Positive for abdominal pain. Negative for nausea, vomiting, diarrhea, constipation, blood in stool and melena.  Genitourinary: Negative.   Musculoskeletal: Negative.   Neurological: Negative.  Negative for weakness.  Psychiatric/Behavioral: The patient is nervous/anxious.     As per HPI. Otherwise, a complete review of systems is negatve.  PAST MEDICAL HISTORY: History reviewed. No pertinent past medical history.  PAST SURGICAL HISTORY: Past Surgical History  Procedure  Laterality Date  . Right foot surgery      FAMILY HISTORY Family History  Problem Relation Age of Onset  . Pancreatic cancer Maternal Grandfather   . Lymphoma Maternal Grandmother   . CAD Father        ADVANCED DIRECTIVES:    HEALTH MAINTENANCE: Social History  Substance Use Topics  . Smoking status: Current Every Day Smoker -- 0.50 packs/day    Types: Cigarettes  . Smokeless tobacco: None     Comment: has more than 30 pack years history  . Alcohol Use: No     Colonoscopy:  PAP:  Bone density:  Lipid panel:  No Known Allergies  Current Facility-Administered Medications  Medication Dose Route Frequency Provider Last Rate Last Dose  . acetaminophen (TYLENOL) tablet 650 mg  650 mg Oral Q6H PRN Gladstone Lighter, MD       Or  . acetaminophen (TYLENOL) suppository 650 mg  650 mg Rectal Q6H PRN Gladstone Lighter, MD      . heparin injection 5,000 Units  5,000 Units Subcutaneous Q8H Gladstone Lighter, MD   5,000 Units at 02/19/16 2045  . morphine 2 MG/ML injection 2 mg  2 mg Intravenous Q4H PRN Gladstone Lighter, MD   2 mg at 02/18/16 2035  . nicotine (NICODERM CQ - dosed in mg/24 hours) patch 21 mg  21 mg Transdermal Daily Gladstone Lighter, MD   21 mg at 02/19/16 1108  . ondansetron (ZOFRAN) tablet 4 mg  4 mg Oral Q6H PRN Gladstone Lighter, MD       Or  . ondansetron (ZOFRAN) injection 4 mg  4 mg Intravenous Q6H PRN Gladstone Lighter, MD   4 mg at 02/18/16 1603  . oxyCODONE (Oxy IR/ROXICODONE) immediate release tablet 5  mg  5 mg Oral Q4H PRN Gladstone Lighter, MD   5 mg at 02/19/16 0746    OBJECTIVE: Filed Vitals:   02/19/16 1342 02/19/16 2118  BP: 119/74 133/89  Pulse: 90 102  Temp: 97.8 F (36.6 C) 98.7 F (37.1 C)  Resp: 14 20     Body mass index is 38.38 kg/(m^2).    ECOG FS:1 - Symptomatic but completely ambulatory  General: Well-developed, well-nourished, no acute distress. Eyes: Pink conjunctiva, anicteric sclera. HEENT: Normocephalic, moist mucous  membranes, clear oropharnyx. Lungs: Clear to auscultation bilaterally. Heart: Regular rate and rhythm. No rubs, murmurs, or gallops. Abdomen: Mildly distended, nontender. Musculoskeletal: No edema, cyanosis, or clubbing. Neuro: Alert, answering all questions appropriately. Cranial nerves grossly intact. Skin: No rashes or petechiae noted. Psych: Normal affect. Lymphatics: No cervical, calvicular, axillary or inguinal LAD.   LAB RESULTS:  Lab Results  Component Value Date   NA 132* 02/19/2016   K 4.3 02/19/2016   CL 101 02/19/2016   CO2 24 02/19/2016   GLUCOSE 100* 02/19/2016   BUN 10 02/19/2016   CREATININE 0.95 02/19/2016   CALCIUM 8.0* 02/19/2016   PROT 6.5 02/18/2016   ALBUMIN 2.8* 02/18/2016   AST 16 02/18/2016   ALT 18 02/18/2016   ALKPHOS 117 02/18/2016   BILITOT 0.7 02/18/2016   GFRNONAA >60 02/19/2016   GFRAA >60 02/19/2016    Lab Results  Component Value Date   WBC 7.3 02/19/2016   HGB 11.8* 02/19/2016   HCT 34.9* 02/19/2016   MCV 80.9 02/19/2016   PLT 430 02/19/2016     STUDIES: Ct Abdomen Pelvis W Contrast  02/18/2016  CLINICAL DATA:  Shortness of breath, abdominal distention for 1 month. History of ventral hernia repair EXAM: CT ABDOMEN AND PELVIS WITH CONTRAST TECHNIQUE: Multidetector CT imaging of the abdomen and pelvis was performed using the standard protocol following bolus administration of intravenous contrast. CONTRAST:  190mL ISOVUE-300 IOPAMIDOL (ISOVUE-300) INJECTION 61% COMPARISON:  Plain films 02/18/2016 FINDINGS: Lower chest: Linear atelectasis in the lung bases. Heart is normal size. No effusions. Hepatobiliary: No focal hepatic abnormality. Gallbladder unremarkable. Pancreas: No focal abnormality or ductal dilatation. Spleen: No focal abnormality.  Normal size. Adrenals/Urinary Tract: No adrenal abnormality. No focal renal abnormality. No stones or hydronephrosis. Urinary bladder is unremarkable. Stomach/Bowel: There is a questionable area of  abnormal wall thickening within the ascending colon, best seen on axial image 48 and coronal image 119. Given the peritoneal findings described below, cannot exclude colonic malignancy. Vascular/Lymphatic: Aorta and iliac vessels are normal caliber. Shotty retroperitoneal lymph nodes. Reproductive: No visible focal abnormality. Other: Large volume ascites in the abdomen and pelvis. There is extensive peritoneal and omental nodularity throughout the abdomen and pelvis. 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 Musculoskeletal: No acute bony abnormality or focal bone lesion. IMPRESSION: Large volume ascites with extensive peritoneal and omental nodularity concerning for peritoneal carcinomatosis. There is an area of apparent abnormal wall thickening within the ascending colon which could reflect colon cancer and source of peritoneal disease. These results were called by telephone at the time of interpretation on 02/18/2016 at 1:46 pm to Dr. Lavonia Drafts , who verbally acknowledged these results. Electronically Signed   By: Rolm Baptise M.D.   On: 02/18/2016 13:47   US Paracentesis  02/19/2016  INDICATION: Concern for peritoneal carcinomatosis with malignant ascites. Please perform ultrasound-guided paracentesis for diagnostic and therapeutic purposes. EXAM: ULTRASOUND-GUIDED PARACENTESIS COMPARISON:  CT abdomen and pelvis - 04/19/2016 MEDICATIONS: None. COMPLICATIONS: None immediate. TECHNIQUE: Informed written consent was  obtained from the patient after a discussion of the risks, benefits and alternatives to treatment. A timeout was performed prior to the initiation of the procedure. Initial ultrasound scanning demonstrates a large amount of ascites within the right lower abdominal quadrant. The right lower abdomen was prepped and draped in the usual sterile fashion. 1% lidocaine with epinephrine was used for local anesthesia. An ultrasound image was saved for documentation purposed. An 8 Fr Safe-T-Centesis  catheter was introduced. The paracentesis was performed. The catheter was removed and a dressing was applied. The patient tolerated the procedure well without immediate post procedural complication. FINDINGS: A total of approximately 10 liters of serous fluid was removed. Samples were sent to the laboratory as requested by the clinical team. IMPRESSION: Successful ultrasound-guided paracentesis yielding 10 liters of peritoneal fluid. Electronically Signed   By: Sandi Mariscal M.D.   On: 02/19/2016 14:06   Dg Abd 2 Views  02/18/2016  CLINICAL DATA:  Lower abdominal pain for 1 month. Abdominal distention increased pain over the past 3 days. EXAM: ABDOMEN - 2 VIEW COMPARISON:  None. FINDINGS: The bowel gas pattern is normal. There is no evidence of free air. No radio-opaque calculi or other significant radiographic abnormality is seen. IMPRESSION: No acute abnormality. Electronically Signed   By: Inge Rise M.D.   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  PLAN:    1. Omental caking/peritoneal carcinomatosis: CT scan results reviewed independently and reported as above with concern for underlying malignancy. CEA and CA-19-9 are within normal limits. Patient had a 10 L paracentesis earlier today and results have been sent for cytology which are also pending. No further intervention needed at this time. Patient can be discharged from an oncology standpoint pending reaccumulation of his fluid. Patient will require a PET scan as an outpatient to complete staging workup once the diagnosis is made. Will arrange follow-up in the [facility] in Monday or Tuesday to discuss the cytology results, further diagnostic planning, and treatment planning if necessary.  Appreciate consult, will follow.   Lloyd Huger, MD   02/19/2016 11:22 PM

## 2016-02-19 NOTE — Procedures (Signed)
Successful US guided paracentesis yielding 10 L of serous ascitic fluid. Sample sent to laboratory as requested. EBL: None No immediate post procedural complications.   Ronny Bacon, MD Pager #: [phone]

## 2016-02-20 LAB — BASIC METABOLIC PANEL
ANION GAP: 7 (ref 5–15)
BUN: 11 mg/dL (ref 6–20)
CO2: 26 mmol/L (ref 22–32)
CREATININE: 0.95 mg/dL (ref 0.61–1.24)
Calcium: 7.6 mg/dL — ABNORMAL LOW (ref 8.9–10.3)
Chloride: 99 mmol/L — ABNORMAL LOW (ref 101–111)
GFR calc Af Amer: >60 mL/min (ref >60)
GLUCOSE: 102 mg/dL — AB (ref 65–99)
POTASSIUM: 4.2 mmol/L (ref 3.5–5.1)
SODIUM: 132 mmol/L — AB (ref 135–145)

## 2016-02-20 MED ORDER — FUROSEMIDE 20 MG PO TABS
20.0000 mg | ORAL_TABLET | Freq: Every day | ORAL | Status: DC | PRN
Start: 2016-02-20 — End: 2016-07-13

## 2016-02-20 MED ORDER — OXYCODONE HCL 5 MG PO TABS: 5.0000 mg | ORAL_TABLET | Freq: Three times a day (TID) | ORAL | Status: DC | PRN

## 2016-02-20 NOTE — Discharge Summary (Signed)
[facility] [place] NAME: Andrew Ibarra    MR#:  [id number]  DATE OF BIRTH:  1965-09-16  DATE OF ADMISSION:  02/18/2016 ADMITTING PHYSICIAN: Gladstone Lighter, MD  DATE OF DISCHARGE: 02/20/2016  1:00 PM  PRIMARY CARE PHYSICIAN: Nathaneil Canary, PA-C    ADMISSION DIAGNOSIS:  Abdominal bloating [R14.0] Peritoneal carcinomatosis (HCC) [C78.6, C80.1] Ascites [R18.8]  DISCHARGE DIAGNOSIS:  Active Problems:   Peritoneal carcinomatosis ([place])   SECONDARY DIAGNOSIS:  History reviewed. No pertinent past medical history.  HOSPITAL COURSE:   Andrew Ibarra is a 50 y.o. male with no significant past medical history comes to emergency room secondary to worsening abdominal distention and also abdominal pain.  #1 Abdominal distention- large volume ascites- could be malignant ascites with omental/peritoneal carcinomatosis as noted on CT abdomen. - Status post US guided paracentesis and almost 10 L taken out yesterday. Sent for cytology. Gram stain and cultures are negative. - CEA and CA 19-9 are within normal limits - Appreciate oncology consult. Patient will follow up at the cancer center in 4 days to discuss the cytology results. Once that is confirmed, further diagnostic planning including a PET scan and treatment planning can be done as outpatient  #2 Tobacco use disorder- strongly counseled against smoking   Stable and is being discharged today   DISCHARGE CONDITIONS:   Stable  CONSULTS OBTAINED:  Treatment Team:  Lloyd Huger, MD Lucilla Lame, MD  DRUG ALLERGIES:  No Known Allergies  DISCHARGE MEDICATIONS:   Discharge Medication List as of 02/20/2016  1:38 PM    START taking these medications   Details  furosemide (LASIX) 20 MG tablet Take 1 tablet (20 mg total) by mouth daily as needed for fluid or edema., Starting 02/20/2016, Until Discontinued, Normal    oxyCODONE (OXY IR/ROXICODONE) 5 MG immediate release tablet  Take 1-2 tablets (5-10 mg total) by mouth every 8 (eight) hours as needed for moderate pain or severe pain., Starting 02/20/2016, Until Discontinued, Print         DISCHARGE INSTRUCTIONS:    1. Oncology follow-up in 5 days 2. PCP follow-up in 1-2 weeks  If you experience worsening of your admission symptoms, develop shortness of breath, life threatening emergency, suicidal or homicidal thoughts you must seek medical attention immediately by calling 911 or calling your MD immediately  if symptoms less severe.  You Must read complete instructions/literature along with all the possible adverse reactions/side effects for all the Medicines you take and that have been prescribed to you. Take any new Medicines after you have completely understood and accept all the possible adverse reactions/side effects.   Please note  You were cared for by a hospitalist during your hospital stay. If you have any questions about your discharge medications or the care you received while you were in the hospital after you are discharged, you can call the unit and asked to speak with the hospitalist on call if the hospitalist that took care of you is not available. Once you are discharged, your primary care physician will handle any further medical issues. Please note that NO REFILLS for any discharge medications will be authorized once you are discharged, as it is imperative that you return to your primary care physician (or establish a relationship with a primary care physician if you do not have one) for your aftercare needs so that they can reassess your need for medications and monitor your lab values.    Today   CHIEF  COMPLAINT:   Chief Complaint  Patient presents with  . Abdominal Pain    VITAL SIGNS:  Blood pressure 113/82, pulse 84, temperature 98 F (36.7 C), temperature source Oral, resp. rate 19, height 5\' 9"  (1.753 m), weight 117.935 kg (260 lb), SpO2 97 %.  I/O:   Intake/Output Summary (Last  24 hours) at 02/20/16 1520 Last data filed at 02/20/16 0900  Gross per 24 hour  Intake    240 ml  Output      0 ml  Net    240 ml    PHYSICAL EXAMINATION:   Physical Exam  GENERAL: 50 y.o.-year-old obese patient lying in the bed with no acute distress.  EYES: Pupils equal, round, reactive to light and accommodation. No scleral icterus. Extraocular muscles intact.  HEENT: Head atraumatic, normocephalic. Oropharynx and nasopharynx clear.  NECK: Supple, no jugular venous distention. No thyroid enlargement, no tenderness.  LUNGS: Normal breath sounds bilaterally, no wheezing, rales,rhonchi or crepitation. No use of accessory muscles of respiration. Decreased bibasilar breath sounds. CARDIOVASCULAR: S1, S2 normal. No murmurs, rubs, or gallops.  ABDOMEN: Much improved abdominal distention, no hepatosplenomegaly. Nontender,  Bowel sounds present.  EXTREMITIES: No pedal edema, cyanosis, or clubbing.  NEUROLOGIC: Cranial nerves II through XII are intact. Muscle strength 5/5 in all extremities. Sensation intact. Gait not checked.  PSYCHIATRIC: The patient is alert and oriented x 3.  SKIN: No obvious rash, lesion, or ulcer.   DATA REVIEW:   CBC  Recent Labs Lab 02/19/16 0455  WBC 7.3  HGB 11.8*  HCT 34.9*  PLT 430    Chemistries   Recent Labs Lab 02/18/16 1100  02/20/16 0257  NA 134*  < > 132*  K 4.5  < > 4.2  CL 99*  < > 99*  CO2 26  < > 26  GLUCOSE 101*  < > 102*  BUN 11  < > 11  CREATININE 1.14  < > 0.95  CALCIUM 8.3*  < > 7.6*  AST 16  --   --   ALT 18  --   --   ALKPHOS 117  --   --   BILITOT 0.7  --   --   < > = values in this interval not displayed.  Cardiac Enzymes No results for input(s): TROPONINI in the last 168 hours.  Microbiology Results  Results for orders placed or performed during the hospital encounter of 02/18/16  Body fluid culture     Status: None (Preliminary result)   Collection Time: 02/19/16 11:55 AM  Result Value Ref Range  Status   Specimen Description PERITONEAL  Final   Special Requests NONE  Final   Gram Stain NO WBC SEEN NO ORGANISMS SEEN   Final   Culture   Final    NO GROWTH < 24 HOURS Performed at [facility]    Report Status PENDING  Incomplete    RADIOLOGY:  US Paracentesis  02/19/2016  INDICATION: Concern for peritoneal carcinomatosis with malignant ascites. Please perform ultrasound-guided paracentesis for diagnostic and therapeutic purposes. EXAM: ULTRASOUND-GUIDED PARACENTESIS COMPARISON:  CT abdomen and pelvis - 04/19/2016 MEDICATIONS: None. COMPLICATIONS: None immediate. TECHNIQUE: Informed written consent was obtained from the patient after a discussion of the risks, benefits and alternatives to treatment. A timeout was performed prior to the initiation of the procedure. Initial ultrasound scanning demonstrates a large amount of ascites within the right lower abdominal quadrant. The right lower abdomen was prepped and draped in the usual sterile fashion. 1% lidocaine with  epinephrine was used for local anesthesia. An ultrasound image was saved for documentation purposed. An 8 Fr Safe-T-Centesis catheter was introduced. The paracentesis was performed. The catheter was removed and a dressing was applied. The patient tolerated the procedure well without immediate post procedural complication. FINDINGS: A total of approximately 10 liters of serous fluid was removed. Samples were sent to the laboratory as requested by the clinical team. IMPRESSION: Successful ultrasound-guided paracentesis yielding 10 liters of peritoneal fluid. Electronically Signed   By: Sandi Mariscal M.D.   On: 02/19/2016 14:06    EKG:   Orders placed or performed during the hospital encounter of 02/18/16  . EKG 12-Lead  . EKG 12-Lead  . ED EKG  . ED EKG      Management plans discussed with the patient, family and they are in agreement.  CODE STATUS:     Code Status Orders        Start     Ordered   02/18/16  1717  Full code   Continuous     02/18/16 1716    Code Status History    Date Active Date Inactive Code Status Order ID Comments User Context   This patient has a current code status but no historical code status.      TOTAL TIME TAKING CARE OF THIS PATIENT:37 minutes.    Gladstone Lighter M.D on 02/20/2016 at 3:20 PM  Between 7am to 6pm - Pager - [phone]  After 6pm go to [email or web address] - password [email or web address] [facility]  Office  [phone]  CC: Primary care physician; Nathaneil Canary, PA-C

## 2016-02-20 NOTE — Progress Notes (Signed)
02/20/2016  1:55 PM  [name] to be D/C'd Home per MD order.  Discussed prescriptions and follow up appointments with the patient. Prescriptions given to patient, medication list explained in detail. Pt verbalized understanding.    Medication List    TAKE these medications        furosemide 20 MG tablet  Commonly known as:  LASIX  Take 1 tablet (20 mg total) by mouth daily as needed for fluid or edema.     oxyCODONE 5 MG immediate release tablet  Commonly known as:  Oxy IR/ROXICODONE  Take 1-2 tablets (5-10 mg total) by mouth every 8 (eight) hours as needed for moderate pain or severe pain.        Filed Vitals:   02/19/16 2118 02/20/16 0529  BP: 133/89 113/82  Pulse: 102 84  Temp: 98.7 F (37.1 C) 98 F (36.7 C)  Resp: 20 19    Skin clean, dry and intact without evidence of skin break down, no evidence of skin tears noted. IV catheter discontinued intact. Site without signs and symptoms of complications. Dressing and pressure applied. Pt denies pain at this time. No complaints noted.  An After Visit Summary was printed and given to the patient. Patient escorted via [place], and D/C home via private auto.  Dola Argyle

## 2016-02-22 LAB — BODY FLUID CULTURE
CULTURE: NO GROWTH
GRAM STAIN: NONE SEEN

## 2016-02-22 LAB — CYTOLOGY - NON PAP

## 2016-02-26 ENCOUNTER — Other Ambulatory Visit: Payer: Self-pay

## 2016-02-26 ENCOUNTER — Telehealth: Payer: Self-pay

## 2016-02-26 ENCOUNTER — Inpatient Hospital Stay: Payer: Commercial Managed Care - PPO | Attending: Oncology | Admitting: Oncology

## 2016-02-26 ENCOUNTER — Encounter: Payer: Self-pay | Admitting: Oncology

## 2016-02-26 ENCOUNTER — Telehealth: Payer: Self-pay | Admitting: *Deleted

## 2016-02-26 VITALS — BP 117/82 | HR 90 | Temp 98.3°F | Wt 246.0 lb

## 2016-02-26 DIAGNOSIS — R109 Unspecified abdominal pain: Secondary | ICD-10-CM | POA: Diagnosis not present

## 2016-02-26 DIAGNOSIS — R188 Other ascites: Secondary | ICD-10-CM | POA: Diagnosis not present

## 2016-02-26 DIAGNOSIS — R1900 Intra-abdominal and pelvic swelling, mass and lump, unspecified site: Secondary | ICD-10-CM | POA: Diagnosis not present

## 2016-02-26 DIAGNOSIS — R0602 Shortness of breath: Secondary | ICD-10-CM | POA: Diagnosis not present

## 2016-02-26 DIAGNOSIS — Z800 Family history of malignant neoplasm of digestive organs: Secondary | ICD-10-CM | POA: Diagnosis not present

## 2016-02-26 DIAGNOSIS — R140 Abdominal distension (gaseous): Secondary | ICD-10-CM | POA: Insufficient documentation

## 2016-02-26 DIAGNOSIS — F17210 Nicotine dependence, cigarettes, uncomplicated: Secondary | ICD-10-CM | POA: Diagnosis not present

## 2016-02-26 DIAGNOSIS — C801 Malignant (primary) neoplasm, unspecified: Secondary | ICD-10-CM

## 2016-02-26 DIAGNOSIS — C786 Secondary malignant neoplasm of retroperitoneum and peritoneum: Secondary | ICD-10-CM | POA: Insufficient documentation

## 2016-02-26 DIAGNOSIS — C189 Malignant neoplasm of colon, unspecified: Secondary | ICD-10-CM | POA: Insufficient documentation

## 2016-02-26 DIAGNOSIS — Z79899 Other long term (current) drug therapy: Secondary | ICD-10-CM | POA: Insufficient documentation

## 2016-02-26 DIAGNOSIS — K6389 Other specified diseases of intestine: Secondary | ICD-10-CM

## 2016-02-26 MED ORDER — NA SULFATE-K SULFATE-MG SULF 17.5-3.13-1.6 GM/177ML PO SOLN: 1.0000 | ORAL | Status: DC

## 2016-02-26 NOTE — Telephone Encounter (Signed)
I spoke with Museum/gallery conservator at [facility] regarding need for patient to be seen by Dr. Allen Norris and have colonoscopy performed. Amber will get patient scheduled and contact patient regarding appointment details.

## 2016-02-26 NOTE — Telephone Encounter (Signed)
Spoke with Andrew Ibarra from Dr. Gary Fleet Office. Patient was seen in their office this morning and is in need of a Colonoscopy from Dr. Allen Norris for a new colon mass. Dr. Grayland Ormond is ok with scheduling colonoscopy prior to seeing patient in office.   Spoke with Dr. Dorothey Baseman nurse Andrew Ibarra at this time and she has given a date of 03/06/16 for this Colonoscopy at [facility] and does not need triage over the phone.   Call made to patient and this information was given. Orders have been placed. Information sent to patient's home. Suprep sent to preferred pharmacy.

## 2016-02-27 ENCOUNTER — Ambulatory Visit
Admission: RE | Admit: 2016-02-27 | Discharge: 2016-02-27 | Disposition: A | Discharge status: Home / Self Care | Payer: Commercial Managed Care - PPO | Source: Ambulatory Visit | Attending: Oncology | Admitting: Oncology

## 2016-02-27 DIAGNOSIS — C801 Malignant (primary) neoplasm, unspecified: Secondary | ICD-10-CM | POA: Insufficient documentation

## 2016-02-27 DIAGNOSIS — R188 Other ascites: Secondary | ICD-10-CM | POA: Insufficient documentation

## 2016-02-27 DIAGNOSIS — C786 Secondary malignant neoplasm of retroperitoneum and peritoneum: Secondary | ICD-10-CM | POA: Insufficient documentation

## 2016-02-27 LAB — GRAM STAIN

## 2016-02-27 LAB — BODY FLUID CELL COUNT WITH DIFFERENTIAL
EOS FL: 0 %
LYMPHS FL: 75 %
Monocyte-Macrophage-Serous Fluid: 21 %
NEUTROPHIL FLUID: 4 %
OTHER CELLS FL: 0 %
Total Nucleated Cell Count, Fluid: 761 cu mm

## 2016-02-27 LAB — PROTEIN, BODY FLUID: Total protein, fluid: 3.8 g/dL

## 2016-02-27 LAB — GLUCOSE, SEROUS FLUID: Glucose, Fluid: 90 mg/dL

## 2016-02-27 NOTE — Procedures (Signed)
US paracentesis without difficulty  Complications:  None  Blood Loss: none  See dictation in canopy pacs  

## 2016-02-29 LAB — CYTOLOGY - NON PAP

## 2016-03-03 ENCOUNTER — Telehealth: Payer: Self-pay | Admitting: *Deleted

## 2016-03-03 ENCOUNTER — Other Ambulatory Visit: Payer: Self-pay | Admitting: *Deleted

## 2016-03-03 DIAGNOSIS — C801 Malignant (primary) neoplasm, unspecified: Principal | ICD-10-CM

## 2016-03-03 DIAGNOSIS — C786 Secondary malignant neoplasm of retroperitoneum and peritoneum: Secondary | ICD-10-CM

## 2016-03-03 LAB — CULTURE, BODY FLUID-BOTTLE: CULTURE: NO GROWTH

## 2016-03-03 LAB — CULTURE, BODY FLUID W GRAM STAIN -BOTTLE

## 2016-03-03 NOTE — Progress Notes (Signed)
[name]  [phone] [phone]  ID: Andrew Ibarra OB: 1966-03-02  MR#: [id number]  GXQ#:[id number]  Patient Care Team: Gunnar Bulla as PCP - General (Physician Assistant) Clent Jacks, RN as Registered Nurse  CHIEF COMPLAINT:  Chief Complaint  Patient presents with  . Hospitalization Follow-up    INTERVAL HISTORY: Patient returns to clinic today for further evaluation, hospital follow-up, and additional diagnostic planning.  He initially presented to the hospital approximately one month of increased abdominal pain and swelling. CT scan was found to have significant amount of ascites as well as omental caking consistent with underlying malignancy. Patient underwent a greater than 10 L paracentesis as inpatient with significant improvement of his symptoms. Since that time, he has noted reaccumulation of this fluid, but otherwise has felt well. He denies any fevers or illnesses. He denies any chest pain or shortness of breath. He has a fair appetite, but denies weight loss. He denies any nausea, vomiting, or diarrhea. He has occasional constipation. He has no urinary complaints. Patient otherwise feels well and offers no further specific complaints.  REVIEW OF SYSTEMS:   ROS  As per HPI. Otherwise, a complete review of systems is negatve.  PAST MEDICAL HISTORY: Past Medical History  Diagnosis Date  . Shortness of breath dyspnea     on exertion  . Loosening of tooth     top front  . Cancer [facility])     suspected colon cancer    PAST SURGICAL HISTORY: Past Surgical History  Procedure Laterality Date  . Right foot surgery  2001    FAMILY HISTORY Family History  Problem Relation Age of Onset  . Pancreatic cancer Maternal Grandfather   . Lymphoma Maternal Grandmother   . CAD Father        ADVANCED DIRECTIVES:    HEALTH MAINTENANCE: Social History  Substance Use Topics  . Smoking status: Current Every Day Smoker  -- 0.50 packs/day for 35 years    Types: Cigarettes  . Smokeless tobacco: Never Used     Comment: has more than 30 pack years history  . Alcohol Use: No     Colonoscopy:  PAP:  Bone density:  Lipid panel:  No Known Allergies  Current Outpatient Prescriptions  Medication Sig Dispense Refill  . acetaminophen-codeine (TYLENOL #3) 300-30 MG tablet Take by mouth.    . furosemide (LASIX) 20 MG tablet Take 1 tablet (20 mg total) by mouth daily as needed for fluid or edema. (Patient taking differently: Take 20 mg by mouth daily as needed for fluid or edema. am) 30 tablet 0  . Na Sulfate-K Sulfate-Mg Sulf (SUPREP BOWEL PREP KIT) 17.5-3.13-1.6 GM/180ML SOLN Take 1 kit by mouth as directed. 1 Bottle 0  . Probiotic Product ([employer] ADV PROBIOTIC GUMMIES PO) Take by mouth daily.     No current facility-administered medications for this visit.    OBJECTIVE: Filed Vitals:   02/26/16 1043  BP: 117/82  Pulse: 90  Temp: 98.3 F (36.8 C)     Body mass index is 36.32 kg/(m^2).    ECOG FS:0 - Asymptomatic  General: Well-developed, well-nourished, no acute distress. Eyes: Pink conjunctiva, anicteric sclera. HEENT: Normocephalic, moist mucous membranes, clear oropharnyx. Lungs: Clear to auscultation bilaterally. Heart: Regular rate and rhythm. No rubs, murmurs, or gallops. Abdomen: Mildly distended, nontender. Musculoskeletal: No edema, cyanosis, or clubbing. Neuro: Alert, answering all questions appropriately. Cranial nerves grossly intact. Skin: No rashes or petechiae noted. Psych: Normal affect.    LAB  RESULTS:  Lab Results  Component Value Date   NA 132* 02/20/2016   K 4.2 02/20/2016   CL 99* 02/20/2016   CO2 26 02/20/2016   GLUCOSE 102* 02/20/2016   BUN 11 02/20/2016   CREATININE 0.95 02/20/2016   CALCIUM 7.6* 02/20/2016   PROT 6.5 02/18/2016   ALBUMIN 2.8* 02/18/2016   AST 16 02/18/2016   ALT 18 02/18/2016   ALKPHOS 117 02/18/2016   BILITOT 0.7 02/18/2016   GFRNONAA >60  02/20/2016   GFRAA >60 02/20/2016    Lab Results  Component Value Date   WBC 7.3 02/19/2016   HGB 11.8* 02/19/2016   HCT 34.9* 02/19/2016   MCV 80.9 02/19/2016   PLT 430 02/19/2016   Lab Results  Component Value Date   CEA 3.1 02/18/2016     STUDIES: Ct Abdomen Pelvis W Contrast  02/18/2016  CLINICAL DATA:  Shortness of breath, abdominal distention for 1 month. History of ventral hernia repair EXAM: CT ABDOMEN AND PELVIS WITH CONTRAST TECHNIQUE: Multidetector CT imaging of the abdomen and pelvis was performed using the standard protocol following bolus administration of intravenous contrast. CONTRAST:  152m ISOVUE-300 IOPAMIDOL (ISOVUE-300) INJECTION 61% COMPARISON:  Plain films 02/18/2016 FINDINGS: Lower chest: Linear atelectasis in the lung bases. Heart is normal size. No effusions. Hepatobiliary: No focal hepatic abnormality. Gallbladder unremarkable. Pancreas: No focal abnormality or ductal dilatation. Spleen: No focal abnormality.  Normal size. Adrenals/Urinary Tract: No adrenal abnormality. No focal renal abnormality. No stones or hydronephrosis. Urinary bladder is unremarkable. Stomach/Bowel: There is a questionable area of abnormal wall thickening within the ascending colon, best seen on axial image 48 and coronal image 119. Given the peritoneal findings described below, cannot exclude colonic malignancy. Vascular/Lymphatic: Aorta and iliac vessels are normal caliber. Shotty retroperitoneal lymph nodes. Reproductive: No visible focal abnormality. Other: Large volume ascites in the abdomen and pelvis. There is extensive peritoneal and omental nodularity throughout the abdomen and pelvis. 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 Musculoskeletal: No acute bony abnormality or focal bone lesion. IMPRESSION: Large volume ascites with extensive peritoneal and omental nodularity concerning for peritoneal carcinomatosis. There is an area of apparent abnormal wall thickening within the  ascending colon which could reflect colon cancer and source of peritoneal disease. These results were called by telephone at the time of interpretation on 02/18/2016 at 1:46 pm to Dr. RLavonia Drafts, who verbally acknowledged these results. Electronically Signed   By: KRolm BaptiseM.D.   On: 02/18/2016 13:47   UKoreaParacentesis  02/27/2016  INDICATION: Ascites and peritoneal carcinomatosis EXAM: ULTRASOUND GUIDED PARACENTESIS MEDICATIONS: None. ANESTHESIA/SEDATION: None COMPLICATIONS: None immediate. PROCEDURE: Informed written consent was obtained from the patient after a discussion of the risks, benefits and alternatives to treatment. A timeout was performed prior to the initiation of the procedure. Initial ultrasound scanning demonstrates a large amount of ascites within the right lower abdominal quadrant. The right lower abdomen was prepped and draped in the usual sterile fashion. 1% lidocaine with epinephrine was used for local anesthesia. Following this, a 6 French Safe-T-Centesis catheter was introduced. An ultrasound image was saved for documentation purposes. The paracentesis was performed. The catheter was removed and a dressing was applied. The patient tolerated the procedure well without immediate post procedural complication. FINDINGS: A total of approximately 5.5 L of clear yellow fluid was removed. Samples were sent to the laboratory as requested by the clinical team. IMPRESSION: Successful ultrasound-guided paracentesis yielding 5.5 liters of peritoneal fluid. Electronically Signed   By: MLinus MakoD.  On: 02/27/2016 14:26   US Paracentesis  02/19/2016  INDICATION: Concern for peritoneal carcinomatosis with malignant ascites. Please perform ultrasound-guided paracentesis for diagnostic and therapeutic purposes. EXAM: ULTRASOUND-GUIDED PARACENTESIS COMPARISON:  CT abdomen and pelvis - 04/19/2016 MEDICATIONS: None. COMPLICATIONS: None immediate. TECHNIQUE: Informed written consent was obtained  from the patient after a discussion of the risks, benefits and alternatives to treatment. A timeout was performed prior to the initiation of the procedure. Initial ultrasound scanning demonstrates a large amount of ascites within the right lower abdominal quadrant. The right lower abdomen was prepped and draped in the usual sterile fashion. 1% lidocaine with epinephrine was used for local anesthesia. An ultrasound image was saved for documentation purposed. An 8 Fr Safe-T-Centesis catheter was introduced. The paracentesis was performed. The catheter was removed and a dressing was applied. The patient tolerated the procedure well without immediate post procedural complication. FINDINGS: A total of approximately 10 liters of serous fluid was removed. Samples were sent to the laboratory as requested by the clinical team. IMPRESSION: Successful ultrasound-guided paracentesis yielding 10 liters of peritoneal fluid. Electronically Signed   By: Sandi Mariscal M.D.   On: 02/19/2016 14:06   Dg Abd 2 Views  02/18/2016  CLINICAL DATA:  Lower abdominal pain for 1 month. Abdominal distention increased pain over the past 3 days. EXAM: ABDOMEN - 2 VIEW COMPARISON:  None. FINDINGS: The bowel gas pattern is normal. There is no evidence of free air. No radio-opaque calculi or other significant radiographic abnormality is seen. IMPRESSION: No acute abnormality. Electronically Signed   By: Inge Rise M.D.   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  PLAN:    1. Omental caking/peritoneal carcinomatosis: CT scan results reviewed independently and reported as above with concern for underlying malignancy. CEA and CA-19-9 are within normal limits. Patient had a 10 L paracentesis in which cytology was reported as suspicious, but not diagnostic for malignancy. Will reorder a second paracentesis and send for cytology again. We will  schedule PET scan for further evaluation and staging purposes. Finally, patient has colonoscopy scheduled for March 06, 2016 to assess for an underlying primary. Patient will return to clinic in 3-4 days after his colonoscopy to discuss the results and any further diagnostic planning necessary.   Approximately 30 minutes was spent in discussion of which greater than 50% was consultation.  Patient expressed understanding and was in agreement with this plan. He also understands that He can call clinic at any time with any questions, concerns, or complaints.   Lloyd Huger, MD   03/03/2016 10:19 PM

## 2016-03-03 NOTE — Telephone Encounter (Signed)
Andrew Ibarra in speciality scheduling contacted cancer center in attempt to coordinate paracentesis.  Unable to schedule paracentesis on same day as pet scan. Could accomodate pt on 6/31/17 at an arrival of 1230pm for a 1pm procedure. I contacted the patient with this appointment; He declined taking this apt. "I feel much better. I had a large bowel movement and I do not feel as bloated. Please cancel my paracentesis." Dr. Grayland Ormond made aware of patient's desire to decline paracentesis.

## 2016-03-03 NOTE — Telephone Encounter (Signed)
Patient called in this morning to report he feels like his abdomen is swelling again and he states he might need to have fluid drained again, last paracentesis 6/14. Patient reports he has not had a bowel movement since last Wednesday, has tried stool softeners and Miralax. Patient reports abdominal pain and increased shortness of breath. Please advise.

## 2016-03-04 ENCOUNTER — Ambulatory Visit
Admission: RE | Admit: 2016-03-04 | Discharge: 2016-03-04 | Disposition: A | Discharge status: Home / Self Care | Payer: Commercial Managed Care - PPO | Source: Ambulatory Visit | Attending: Oncology | Admitting: Oncology

## 2016-03-04 DIAGNOSIS — K621 Rectal polyp: Secondary | ICD-10-CM | POA: Diagnosis not present

## 2016-03-04 DIAGNOSIS — C786 Secondary malignant neoplasm of retroperitoneum and peritoneum: Secondary | ICD-10-CM

## 2016-03-04 DIAGNOSIS — C801 Malignant (primary) neoplasm, unspecified: Principal | ICD-10-CM

## 2016-03-04 DIAGNOSIS — Z807 Family history of other malignant neoplasms of lymphoid, hematopoietic and related tissues: Secondary | ICD-10-CM | POA: Diagnosis not present

## 2016-03-04 DIAGNOSIS — C180 Malignant neoplasm of cecum: Secondary | ICD-10-CM | POA: Diagnosis not present

## 2016-03-04 DIAGNOSIS — F17210 Nicotine dependence, cigarettes, uncomplicated: Secondary | ICD-10-CM | POA: Diagnosis not present

## 2016-03-04 DIAGNOSIS — K0889 Other specified disorders of teeth and supporting structures: Secondary | ICD-10-CM | POA: Diagnosis not present

## 2016-03-04 DIAGNOSIS — R0600 Dyspnea, unspecified: Secondary | ICD-10-CM | POA: Diagnosis not present

## 2016-03-04 DIAGNOSIS — Z800 Family history of malignant neoplasm of digestive organs: Secondary | ICD-10-CM | POA: Diagnosis not present

## 2016-03-04 DIAGNOSIS — R933 Abnormal findings on diagnostic imaging of other parts of digestive tract: Secondary | ICD-10-CM | POA: Diagnosis present

## 2016-03-04 DIAGNOSIS — Z8249 Family history of ischemic heart disease and other diseases of the circulatory system: Secondary | ICD-10-CM | POA: Diagnosis not present

## 2016-03-04 DIAGNOSIS — Z79899 Other long term (current) drug therapy: Secondary | ICD-10-CM | POA: Diagnosis not present

## 2016-03-04 LAB — GLUCOSE, CAPILLARY: Glucose-Capillary: 96 mg/dL (ref 65–99)

## 2016-03-04 MED ORDER — FLUDEOXYGLUCOSE F - 18 (FDG) INJECTION
13.0400 | Freq: Once | INTRAVENOUS | Status: AC | PRN
  Administered 2016-03-04: 13.04 via INTRAVENOUS

## 2016-03-05 ENCOUNTER — Ambulatory Visit: Payer: Commercial Managed Care - PPO

## 2016-03-05 ENCOUNTER — Inpatient Hospital Stay: Payer: Commercial Managed Care - PPO | Admitting: Oncology

## 2016-03-05 NOTE — Discharge Instructions (Signed)

## 2016-03-06 ENCOUNTER — Ambulatory Visit: Payer: Commercial Managed Care - PPO | Admitting: Anesthesiology

## 2016-03-06 ENCOUNTER — Ambulatory Visit
Admission: RE | Admit: 2016-03-06 | Discharge: 2016-03-06 | Disposition: A | Discharge status: Home / Self Care | Payer: Commercial Managed Care - PPO | Source: Ambulatory Visit | Attending: Gastroenterology | Admitting: Gastroenterology

## 2016-03-06 ENCOUNTER — Encounter
Admission: RE | Disposition: A | Discharge status: Home / Self Care | Payer: Self-pay | Source: Ambulatory Visit | Attending: Gastroenterology

## 2016-03-06 DIAGNOSIS — D490 Neoplasm of unspecified behavior of digestive system: Secondary | ICD-10-CM

## 2016-03-06 DIAGNOSIS — R198 Other specified symptoms and signs involving the digestive system and abdomen: Secondary | ICD-10-CM | POA: Insufficient documentation

## 2016-03-06 DIAGNOSIS — C180 Malignant neoplasm of cecum: Secondary | ICD-10-CM | POA: Diagnosis not present

## 2016-03-06 DIAGNOSIS — K0889 Other specified disorders of teeth and supporting structures: Secondary | ICD-10-CM | POA: Insufficient documentation

## 2016-03-06 DIAGNOSIS — K621 Rectal polyp: Secondary | ICD-10-CM | POA: Diagnosis not present

## 2016-03-06 DIAGNOSIS — Z800 Family history of malignant neoplasm of digestive organs: Secondary | ICD-10-CM | POA: Insufficient documentation

## 2016-03-06 DIAGNOSIS — Z79899 Other long term (current) drug therapy: Secondary | ICD-10-CM | POA: Insufficient documentation

## 2016-03-06 DIAGNOSIS — R933 Abnormal findings on diagnostic imaging of other parts of digestive tract: Secondary | ICD-10-CM

## 2016-03-06 DIAGNOSIS — Z807 Family history of other malignant neoplasms of lymphoid, hematopoietic and related tissues: Secondary | ICD-10-CM | POA: Insufficient documentation

## 2016-03-06 DIAGNOSIS — Z8249 Family history of ischemic heart disease and other diseases of the circulatory system: Secondary | ICD-10-CM | POA: Insufficient documentation

## 2016-03-06 DIAGNOSIS — R0600 Dyspnea, unspecified: Secondary | ICD-10-CM | POA: Insufficient documentation

## 2016-03-06 DIAGNOSIS — F17210 Nicotine dependence, cigarettes, uncomplicated: Secondary | ICD-10-CM | POA: Insufficient documentation

## 2016-03-06 HISTORY — DX: Reserved for inherently not codable concepts without codable children: IMO0001

## 2016-03-06 HISTORY — PX: POLYPECTOMY: SHX5525

## 2016-03-06 HISTORY — PX: COLONOSCOPY WITH PROPOFOL: SHX5780

## 2016-03-06 HISTORY — DX: Malignant (primary) neoplasm, unspecified: C80.1

## 2016-03-06 HISTORY — DX: Other specified disorders of teeth and supporting structures: K08.89

## 2016-03-06 SURGERY — COLONOSCOPY WITH PROPOFOL
Anesthesia: Monitor Anesthesia Care | Wound class: Contaminated

## 2016-03-06 MED ORDER — OXYCODONE HCL 5 MG/5ML PO SOLN: 5.0000 mg | Freq: Once | ORAL | Status: DC | PRN

## 2016-03-06 MED ORDER — LIDOCAINE HCL (CARDIAC) 20 MG/ML IV SOLN
INTRAVENOUS | Status: DC | PRN
  Administered 2016-03-06: 50 mg via INTRAVENOUS

## 2016-03-06 MED ORDER — STERILE WATER FOR IRRIGATION IR SOLN
Status: DC | PRN
  Administered 2016-03-06: 12:00:00

## 2016-03-06 MED ORDER — OXYCODONE HCL 5 MG PO TABS: 5.0000 mg | ORAL_TABLET | Freq: Once | ORAL | Status: DC | PRN

## 2016-03-06 MED ORDER — PROPOFOL 10 MG/ML IV BOLUS
INTRAVENOUS | Status: DC | PRN
  Administered 2016-03-06: 30 mg via INTRAVENOUS
  Administered 2016-03-06: 20 mg via INTRAVENOUS
  Administered 2016-03-06: 30 mg via INTRAVENOUS
  Administered 2016-03-06 (×2): 20 mg via INTRAVENOUS
  Administered 2016-03-06: 30 mg via INTRAVENOUS
  Administered 2016-03-06: 20 mg via INTRAVENOUS
  Administered 2016-03-06: 70 mg via INTRAVENOUS
  Administered 2016-03-06 (×2): 30 mg via INTRAVENOUS
  Administered 2016-03-06: 20 mg via INTRAVENOUS

## 2016-03-06 MED ORDER — LACTATED RINGERS IV SOLN
INTRAVENOUS | Status: DC
  Administered 2016-03-06: 11:00:00 via INTRAVENOUS

## 2016-03-06 SURGICAL SUPPLY — 22 items
CANISTER SUCT 1200ML W/VALVE (MISCELLANEOUS) ×4 IMPLANT
CLIP HMST 235XBRD CATH ROT (MISCELLANEOUS) IMPLANT
CLIP RESOLUTION 360 11X235 (MISCELLANEOUS)
FCP ESCP3.2XJMB 240X2.8X (MISCELLANEOUS)
FORCEPS BIOP RAD 4 LRG CAP 4 (CUTTING FORCEPS) ×4 IMPLANT
FORCEPS BIOP RJ4 240 W/NDL (MISCELLANEOUS)
FORCEPS ESCP3.2XJMB 240X2.8X (MISCELLANEOUS) IMPLANT
GOWN CVR UNV OPN BCK APRN NK (MISCELLANEOUS) ×4 IMPLANT
GOWN ISOL THUMB LOOP REG UNIV (MISCELLANEOUS) ×4
INJECTOR VARIJECT VIN23 (MISCELLANEOUS) IMPLANT
KIT DEFENDO VALVE AND CONN (KITS) IMPLANT
KIT ENDO PROCEDURE OLY (KITS) ×4 IMPLANT
MARKER SPOT ENDO TATTOO 5ML (MISCELLANEOUS) IMPLANT
PAD GROUND ADULT SPLIT (MISCELLANEOUS) IMPLANT
PROBE APC STR FIRE (PROBE) IMPLANT
SNARE SHORT THROW 13M SML OVAL (MISCELLANEOUS) ×4 IMPLANT
SNARE SHORT THROW 30M LRG OVAL (MISCELLANEOUS) IMPLANT
SNARE SNG USE RND 15MM (INSTRUMENTS) IMPLANT
SPOT EX ENDOSCOPIC TATTOO (MISCELLANEOUS)
TRAP ETRAP POLY (MISCELLANEOUS) ×4 IMPLANT
VARIJECT INJECTOR VIN23 (MISCELLANEOUS)
WATER STERILE IRR 250ML POUR (IV SOLUTION) ×4 IMPLANT

## 2016-03-06 NOTE — Op Note (Addendum)
[facility] Gastroenterology Patient Name: Andrew Ibarra Procedure Date: 03/06/2016 11:32 AM MRN: [id number] Account #: [id number] Date of Birth: 04/20/1966 Admit Type: Outpatient Age: 50 Room: [facility] OR ROOM 01 Gender: Male Note Status: Supervisor Override Procedure:            Colonoscopy Indications:          Abnormal CT of the GI tract Providers:            Lucilla Lame, MD Referring MD:         Kathlene November. Grayland Ormond, MD (Referring MD) Medicines:            Propofol per Anesthesia Complications:        No immediate complications. Procedure:            Pre-Anesthesia Assessment:                       - Prior to the procedure, a History and Physical was                        performed, and patient medications and allergies were                        reviewed. The patient's tolerance of previous                        anesthesia was also reviewed. The risks and benefits of                        the procedure and the sedation options and risks were                        discussed with the patient. All questions were                        answered, and informed consent was obtained. Prior                        Anticoagulants: The patient has taken no previous                        anticoagulant or antiplatelet agents. ASA Grade                        Assessment: II - A patient with mild systemic disease.                        After reviewing the risks and benefits, the patient was                        deemed in satisfactory condition to undergo the                        procedure.                       After obtaining informed consent, the colonoscope was                        passed under direct vision. Throughout the procedure,  the patient's blood pressure, pulse, and oxygen                        saturations were monitored continuously. The Olympus CF                        H180AL colonoscope (S#: [id number]) was introduced through                       the anus and advanced to the the cecum, identified by                        palpation. The colonoscopy was performed without                        difficulty. The patient tolerated the procedure well.                        The quality of the bowel preparation was good. Findings:      The perianal and digital rectal examinations were normal.      Two sessile polyps were found in the rectum. The polyps were 5 to 7 mm       in size. These polyps were removed with a cold snare. Resection and       retrieval were complete. For hemostasis, one hemostatic clip was       successfully placed (MR conditional). There was no bleeding at the end       of the procedure.      An ulcerated partially obstructing large mass was found in the cecum.       The mass was circumferential. No bleeding was present. This was biopsied       with a cold forceps for histology. Impression:           - Two 5 to 7 mm polyps in the rectum, removed with a                        cold snare. Resected and retrieved. Clip (MR                        conditional) was placed.                       - Partially obstructing tumor in the cecum. Biopsied.                       - Malignant-appearing tumor in the colon. Complete                        removal was not accomplished. Recommendation:       - Await pathology results. Procedure Code(s):    --- Professional ---                       [phone], Colonoscopy, flexible; with removal of tumor(s),                        polyp(s), or other lesion(s) by snare technique                       [id number], 59, Colonoscopy, flexible; with biopsy, single  or multiple Diagnosis Code(s):    --- Professional ---                       R93.3, Abnormal findings on diagnostic imaging of other                        parts of digestive tract                       K62.1, Rectal polyp                       D49.0, Neoplasm of unspecified behavior of digestive                         system CPT copyright 2016 American Medical Association. All rights reserved. The codes documented in this report are preliminary and upon coder review may  be revised to meet current compliance requirements. Lucilla Lame MD, MD 03/06/2016 11:57:45 AM This report has been signed electronically. Number of Addenda: 0 Note Initiated On: 03/06/2016 11:32 AM Scope Withdrawal Time: 0 hours 6 minutes 36 seconds  Total Procedure Duration: 0 hours 12 minutes 44 seconds       [facility]

## 2016-03-06 NOTE — Transfer of Care (Signed)
Immediate Anesthesia Transfer of Care Note  Patient: Andrew Ibarra  Procedure(s) Performed: Procedure(s): COLONOSCOPY WITH PROPOFOL (N/A) POLYPECTOMY  Patient Location: PACU  Anesthesia Type: MAC  Level of Consciousness: awake, alert  and patient cooperative  Airway and Oxygen Therapy: Patient Spontanous Breathing and Patient connected to supplemental oxygen  Post-op Assessment: Post-op Vital signs reviewed, Patient's Cardiovascular Status Stable, Respiratory Function Stable, Patent Airway and No signs of Nausea or vomiting  Post-op Vital Signs: Reviewed and stable  Complications: No apparent anesthesia complications

## 2016-03-06 NOTE — Anesthesia Procedure Notes (Signed)
Procedure Name: MAC Performed by: Josefine Fuhr Pre-anesthesia Checklist: Patient identified, Emergency Drugs available, Suction available, Timeout performed and Patient being monitored Patient Re-evaluated:Patient Re-evaluated prior to inductionOxygen Delivery Method: Nasal cannula Placement Confirmation: positive ETCO2       

## 2016-03-06 NOTE — Anesthesia Postprocedure Evaluation (Signed)
Anesthesia Post Note  Patient: Andrew Ibarra  Procedure(s) Performed: Procedure(s) (LRB): COLONOSCOPY WITH PROPOFOL (N/A) POLYPECTOMY  Patient location during evaluation: PACU Anesthesia Type: MAC Level of consciousness: awake and alert Pain management: pain level controlled Vital Signs Assessment: post-procedure vital signs reviewed and stable Respiratory status: spontaneous breathing, nonlabored ventilation, respiratory function stable and patient connected to nasal cannula oxygen Cardiovascular status: stable and blood pressure returned to baseline Anesthetic complications: no    Denise Bramblett

## 2016-03-06 NOTE — Anesthesia Preprocedure Evaluation (Addendum)
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   Endo/Other  negative endocrine ROS  Renal/GU negative Renal ROS  negative genitourinary   Musculoskeletal   Abdominal   Peds  Hematology negative hematology ROS (+)   Anesthesia Other Findings   Reproductive/Obstetrics                           Anesthesia Physical Anesthesia Plan  ASA: III  Anesthesia Plan: MAC   Post-op Pain Management:    Induction:   Airway Management Planned:   Additional Equipment:   Intra-op Plan:   Post-operative Plan:   Informed Consent: I have reviewed the patients History and Physical, chart, labs and discussed the procedure including the risks, benefits and alternatives for the proposed anesthesia with the patient or authorized representative who has indicated his/her understanding and acceptance.     Plan Discussed with: CRNA  Anesthesia Plan Comments:        Anesthesia Quick Evaluation

## 2016-03-06 NOTE — H&P (Signed)
  Lucilla Lame, MD [address] [place], [place] [address] Phone: [phone] Fax : [phone]  Primary Care Physician:  Nathaneil Canary, PA-C Primary Gastroenterologist:  Dr. Allen Norris  Pre-Procedure History & Physical: HPI:  Andrew Ibarra is a 50 y.o. male is here for an colonoscopy.   Past Medical History  Diagnosis Date  . Shortness of breath dyspnea     on exertion  . Loosening of tooth     top front  . Cancer [facility])     suspected colon cancer    Past Surgical History  Procedure Laterality Date  . Right foot surgery  2001    Prior to Admission medications   Medication Sig Start Date End Date Taking? Authorizing Provider  Probiotic Product ([employer] ADV PROBIOTIC GUMMIES PO) Take by mouth daily.   Yes Historical Provider, MD  acetaminophen-codeine (TYLENOL #3) 300-30 MG tablet Take by mouth. 04/14/14   Historical Provider, MD  furosemide (LASIX) 20 MG tablet Take 1 tablet (20 mg total) by mouth daily as needed for fluid or edema. Patient taking differently: Take 20 mg by mouth daily as needed for fluid or edema. am 02/20/16   Gladstone Lighter, MD  Na Sulfate-K Sulfate-Mg Sulf (SUPREP BOWEL PREP KIT) 17.5-3.13-1.6 GM/180ML SOLN Take 1 kit by mouth as directed. 02/26/16   Lucilla Lame, MD    Allergies as of 02/26/2016  . (No Known Allergies)    Family History  Problem Relation Age of Onset  . Pancreatic cancer Maternal Grandfather   . Lymphoma Maternal Grandmother   . CAD Father     Social History   Social History  . Marital Status: Married    Spouse Name: N/A  . Number of Children: N/A  . Years of Education: N/A   Occupational History  . Not on file.   Social History Main Topics  . Smoking status: Current Every Day Smoker -- 0.50 packs/day for 35 years    Types: Cigarettes  . Smokeless tobacco: Never Used     Comment: has more than 30 pack years history  . Alcohol Use: No  . Drug Use: No  . Sexual Activity: Not on file   Other Topics  Concern  . Not on file   Social History Narrative   Independent at baseline. Lives with wife.    Review of Systems: See HPI, otherwise negative ROS  Physical Exam: Ht '5\' 9"'$  (1.753 m)  Wt 230 lb (104.327 kg)  BMI 33.95 kg/m2 General:   Alert,  pleasant and cooperative in NAD Head:  Normocephalic and atraumatic. Neck:  Supple; no masses or thyromegaly. Lungs:  Clear throughout to auscultation.    Heart:  Regular rate and rhythm. Abdomen:  Soft, nontender and nondistended. Normal bowel sounds, without guarding, and without rebound.   Neurologic:  Alert and  oriented x4;  grossly normal neurologically.  Impression/Plan: Andrew Ibarra is here for an colonoscopy to be performed for positive PET scan  Risks, benefits, limitations, and alternatives regarding  colonoscopy have been reviewed with the patient.  Questions have been answered.  All parties agreeable.   Lucilla Lame, MD  03/06/2016, 10:34 AM

## 2016-03-07 ENCOUNTER — Encounter: Payer: Self-pay | Admitting: Gastroenterology

## 2016-03-11 LAB — SURGICAL PATHOLOGY

## 2016-03-12 ENCOUNTER — Inpatient Hospital Stay (HOSPITAL_BASED_OUTPATIENT_CLINIC_OR_DEPARTMENT_OTHER): Payer: Commercial Managed Care - PPO | Admitting: Oncology

## 2016-03-12 VITALS — BP 119/83 | HR 92 | Temp 98.3°F | Resp 18 | Wt 239.6 lb

## 2016-03-12 DIAGNOSIS — R109 Unspecified abdominal pain: Secondary | ICD-10-CM

## 2016-03-12 DIAGNOSIS — R0602 Shortness of breath: Secondary | ICD-10-CM

## 2016-03-12 DIAGNOSIS — F17210 Nicotine dependence, cigarettes, uncomplicated: Secondary | ICD-10-CM

## 2016-03-12 DIAGNOSIS — C786 Secondary malignant neoplasm of retroperitoneum and peritoneum: Secondary | ICD-10-CM | POA: Diagnosis not present

## 2016-03-12 DIAGNOSIS — R140 Abdominal distension (gaseous): Secondary | ICD-10-CM | POA: Diagnosis not present

## 2016-03-12 DIAGNOSIS — C189 Malignant neoplasm of colon, unspecified: Secondary | ICD-10-CM

## 2016-03-12 DIAGNOSIS — Z79899 Other long term (current) drug therapy: Secondary | ICD-10-CM

## 2016-03-12 DIAGNOSIS — R188 Other ascites: Secondary | ICD-10-CM

## 2016-03-12 DIAGNOSIS — Z800 Family history of malignant neoplasm of digestive organs: Secondary | ICD-10-CM

## 2016-03-12 DIAGNOSIS — C182 Malignant neoplasm of ascending colon: Secondary | ICD-10-CM

## 2016-03-12 MED ORDER — ZOLPIDEM TARTRATE 5 MG PO TABS: 5.0000 mg | ORAL_TABLET | Freq: Every evening | ORAL | Status: DC | PRN

## 2016-03-12 MED ORDER — HYDROCODONE-ACETAMINOPHEN 5-325 MG PO TABS: 1.0000 | ORAL_TABLET | ORAL | Status: DC | PRN

## 2016-03-12 NOTE — Progress Notes (Signed)
States has had issues with constipation for the past few weeks. Has about 1 bowel movement per week. Having pain in abdomen and feels like he is "bloated". Has difficulty sleeping at night due to pain. Would like to discuss prescription for pain medication.

## 2016-03-13 NOTE — Patient Instructions (Signed)

## 2016-03-14 ENCOUNTER — Other Ambulatory Visit: Payer: Self-pay | Admitting: *Deleted

## 2016-03-14 DIAGNOSIS — D490 Neoplasm of unspecified behavior of digestive system: Secondary | ICD-10-CM

## 2016-03-16 DIAGNOSIS — C772 Secondary and unspecified malignant neoplasm of intra-abdominal lymph nodes: Secondary | ICD-10-CM | POA: Insufficient documentation

## 2016-03-16 DIAGNOSIS — C182 Malignant neoplasm of ascending colon: Secondary | ICD-10-CM | POA: Insufficient documentation

## 2016-03-16 MED ORDER — PROCHLORPERAZINE MALEATE 10 MG PO TABS: 10.0000 mg | ORAL_TABLET | Freq: Four times a day (QID) | ORAL | Status: DC | PRN

## 2016-03-16 MED ORDER — LIDOCAINE-PRILOCAINE 2.5-2.5 % EX CREA: TOPICAL_CREAM | CUTANEOUS | Status: DC

## 2016-03-16 MED ORDER — ONDANSETRON HCL 8 MG PO TABS: 8.0000 mg | ORAL_TABLET | Freq: Two times a day (BID) | ORAL | Status: DC | PRN

## 2016-03-16 NOTE — Progress Notes (Signed)
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 Chief Complaint  Patient presents with  . peritoneal carcinomatosis    INTERVAL HISTORY: Patient returns to clinic today for further evaluation and treatment planning. He continues to have abdominal pain and bloating. He also complains of constipation. Finally, he is having difficulty sleeping. He has no neurologic complaints. He denies any fevers or illnesses. He denies any chest pain or shortness of breath. He has a poor appetite, but denies weight loss. He denies any nausea, vomiting, or diarrhea. He has no urinary complaints. Patient feels generally terrible, but offers no further specific complaints.  REVIEW OF SYSTEMS:   Review of Systems  Constitutional: Positive for malaise/fatigue. Negative for fever and weight loss.  Respiratory: Negative.  Negative for cough and shortness of breath.   Cardiovascular: Negative.  Negative for chest pain.  Gastrointestinal: Positive for nausea, abdominal pain and constipation. Negative for vomiting, diarrhea, blood in stool and melena.  Genitourinary: Negative.   Musculoskeletal: Negative.   Neurological: Positive for weakness.  Psychiatric/Behavioral: The patient is nervous/anxious and has insomnia.     As per HPI. Otherwise, a complete review of systems is negatve.  PAST MEDICAL HISTORY: Past Medical History  Diagnosis Date  . Shortness of breath dyspnea     on exertion  . Loosening of tooth     top front  . Cancer Andrew Ibarra)     suspected colon cancer    PAST SURGICAL HISTORY: Past Surgical History  Procedure Laterality  Date  . Right foot surgery  2001  . Colonoscopy with propofol N/A 03/06/2016    Procedure: COLONOSCOPY WITH PROPOFOL;  Surgeon: Lucilla Lame, MD;  Location: [place];  Service: Endoscopy;  Laterality: N/A;  . Polypectomy  03/06/2016    Procedure: POLYPECTOMY;  Surgeon: Lucilla Lame, MD;  Location: [place];  Service: Endoscopy;;    FAMILY HISTORY Family History  Problem Relation Age of Onset  . Pancreatic cancer Maternal Grandfather   . Lymphoma Maternal Grandmother   . CAD Father        ADVANCED DIRECTIVES:    HEALTH MAINTENANCE: Social History  Substance Use Topics  . Smoking status: Current Every Day Smoker -- 0.50 packs/day for 35 years    Types: Cigarettes  . Smokeless tobacco: Never Used     Comment: has more than 30 pack years history  . Alcohol Use: No     Colonoscopy:  PAP:  Bone density:  Lipid panel:  No Known Allergies  Current Outpatient Prescriptions  Medication Sig Dispense Refill  . acetaminophen-codeine (TYLENOL #3) 300-30 MG tablet Take by mouth.    . furosemide (LASIX) 20 MG tablet Take 1 tablet (20 mg total) by mouth daily as needed for fluid or edema. (Patient taking differently: Take 20 mg by mouth daily as needed for fluid or edema. am) 30 tablet 0  . Na Sulfate-K Sulfate-Mg Sulf (SUPREP BOWEL PREP KIT) 17.5-3.13-1.6 GM/180ML SOLN Take 1 kit by mouth as directed. 1 Bottle 0  . Probiotic Product ([employer] ADV PROBIOTIC GUMMIES PO) Take by mouth daily.    Marland Kitchen HYDROcodone-acetaminophen (NORCO/VICODIN) 5-325 MG tablet Take 1 tablet by mouth every 4 (four) hours as needed for  moderate pain. 60 tablet 0  . zolpidem (AMBIEN) 5 MG tablet Take 1 tablet (5 mg total) by mouth at bedtime as needed for sleep. 30 tablet 0   No current facility-administered medications for this visit.    OBJECTIVE: Filed Vitals:   03/12/16 1433  BP: 119/83  Pulse: 92  Temp: 98.3 F (36.8 C)  Resp: 18     Body mass index is 35.37 kg/(m^2).    ECOG FS:1 -  Symptomatic but completely ambulatory  General: Well-developed, well-nourished, no acute distress. Eyes: Pink conjunctiva, anicteric sclera. HEENT: Normocephalic, moist mucous membranes, clear oropharnyx. Lungs: Clear to auscultation bilaterally. Heart: Regular rate and rhythm. No rubs, murmurs, or gallops. Abdomen: Mildly distended, nontender. Musculoskeletal: No edema, cyanosis, or clubbing. Neuro: Alert, answering all questions appropriately. Cranial nerves grossly intact. Skin: No rashes or petechiae noted. Psych: Normal affect.    LAB RESULTS:  Lab Results  Component Value Date   NA 132* 02/20/2016   K 4.2 02/20/2016   CL 99* 02/20/2016   CO2 26 02/20/2016   GLUCOSE 102* 02/20/2016   BUN 11 02/20/2016   CREATININE 0.95 02/20/2016   CALCIUM 7.6* 02/20/2016   PROT 6.5 02/18/2016   ALBUMIN 2.8* 02/18/2016   AST 16 02/18/2016   ALT 18 02/18/2016   ALKPHOS 117 02/18/2016   BILITOT 0.7 02/18/2016   GFRNONAA >60 02/20/2016   GFRAA >60 02/20/2016    Lab Results  Component Value Date   WBC 7.3 02/19/2016   HGB 11.8* 02/19/2016   HCT 34.9* 02/19/2016   MCV 80.9 02/19/2016   PLT 430 02/19/2016   Lab Results  Component Value Date   CEA 3.1 02/18/2016     STUDIES: Ct Abdomen Pelvis W Contrast  02/18/2016  CLINICAL DATA:  Shortness of breath, abdominal distention for 1 month. History of ventral hernia repair EXAM: CT ABDOMEN AND PELVIS WITH CONTRAST TECHNIQUE: Multidetector CT imaging of the abdomen and pelvis was performed using the standard protocol following bolus administration of intravenous contrast. CONTRAST:  133m ISOVUE-300 IOPAMIDOL (ISOVUE-300) INJECTION 61% COMPARISON:  Plain films 02/18/2016 FINDINGS: Lower chest: Linear atelectasis in the lung bases. Heart is normal size. No effusions. Hepatobiliary: No focal hepatic abnormality. Gallbladder unremarkable. Pancreas: No focal abnormality or ductal dilatation. Spleen: No focal abnormality.  Normal size.  Adrenals/Urinary Tract: No adrenal abnormality. No focal renal abnormality. No stones or hydronephrosis. Urinary bladder is unremarkable. Stomach/Bowel: There is a questionable area of abnormal wall thickening within the ascending colon, best seen on axial image 48 and coronal image 119. Given the peritoneal findings described below, cannot exclude colonic malignancy. Vascular/Lymphatic: Aorta and iliac vessels are normal caliber. Shotty retroperitoneal lymph nodes. Reproductive: No visible focal abnormality. Other: Large volume ascites in the abdomen and pelvis. There is extensive peritoneal and omental nodularity throughout the abdomen and pelvis. 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 Musculoskeletal: No acute bony abnormality or focal bone lesion. IMPRESSION: Large volume ascites with extensive peritoneal and omental nodularity concerning for peritoneal carcinomatosis. There is an area of apparent abnormal wall thickening within the ascending colon which could reflect colon cancer and source of peritoneal disease. These results were called by telephone at the time of interpretation on 02/18/2016 at 1:46 pm to Dr. RLavonia Drafts, who verbally acknowledged these results. Electronically Signed   By: KRolm BaptiseM.D.   On: 02/18/2016 13:47   Nm Pet Image Initial (pi) Skull Base To Thigh  03/04/2016  CLINICAL DATA:  Initial treatment strategy for peritoneal carcinomatosis. EXAM: NUCLEAR MEDICINE PET  SKULL BASE TO THIGH TECHNIQUE: Thirteen mCi F-18 FDG was injected intravenously. Full-ring PET imaging was performed from the skull base to thigh after the radiotracer. CT data was obtained and used for attenuation correction and anatomic localization. FASTING BLOOD GLUCOSE:  Value: 96 mg/dl COMPARISON:  None. FINDINGS: NECK There is a left level 2 node which measures 1 cm, image 38 of series 3. SUV max associated with this node is equal to 3.1. CHEST No mediastinal or hilar adenopathy. Mild asymmetric  increased uptake within the left hilar region has an SUV max equal to 3.0. No pulmonary nodules identified. No axillary or supraclavicular adenopathy noted. ABDOMEN/PELVIS No abnormal hypermetabolic activity within the liver, pancreas, adrenal glands, or spleen. Moderate volume of ascites identified within the abdomen and pelvis. Extensive peritoneal carcinomatosis is identified. Index lesion within the right upper quadrant of the abdomen measures 3.5 x 2.0 cm, image 191 of series 3. SUV max equals 8.27. Peritoneal lesion adjacent to the inferior right lobe of lesion measures 2.7 x 1.7 cm, image 176 of series 3. SUV max equals 9.1. Peritoneal lesion within the left lower quadrant of the abdomen measures 2.4 x 1.5 cm and has an SUV max equal to 6.09. Diffuse mesenteric adenopathy is intensely hypermetabolic. Index jejunal mesenteric node measures 2 x 1.7 cm, image 1 193 of series 3. SUV max equals 7.5. Lastly from the gastrohepatic ligament region nodules/lymph node measures 1.7 x 1.5 cm and has an SUV max equal to 5.7. No pathologic dilatation of the small or large bowel loops identified. SKELETON No focal hypermetabolic activity to suggest skeletal metastasis. IMPRESSION: 1. Evidence of extensive hypermetabolic peritoneal carcinomatosis as above. 2. There is equivocal uptake associated with borderline enlarged left level 2 lymph node and left hilar lymph node. SUV max equal to 3.0. Electronically Signed   By: Kerby Moors M.D.   On: 03/04/2016 13:30   US Paracentesis  02/27/2016  INDICATION: Ascites and peritoneal carcinomatosis EXAM: ULTRASOUND GUIDED PARACENTESIS MEDICATIONS: None. ANESTHESIA/SEDATION: None COMPLICATIONS: None immediate. PROCEDURE: Informed written consent was obtained from the patient after a discussion of the risks, benefits and alternatives to treatment. A timeout was performed prior to the initiation of the procedure. Initial ultrasound scanning demonstrates a large amount of ascites  within the right lower abdominal quadrant. The right lower abdomen was prepped and draped in the usual sterile fashion. 1% lidocaine with epinephrine was used for local anesthesia. Following this, a 6 French Safe-T-Centesis catheter was introduced. An ultrasound image was saved for documentation purposes. The paracentesis was performed. The catheter was removed and a dressing was applied. The patient tolerated the procedure well without immediate post procedural complication. FINDINGS: A total of approximately 5.5 L of clear yellow fluid was removed. Samples were sent to the laboratory as requested by the clinical team. IMPRESSION: Successful ultrasound-guided paracentesis yielding 5.5 liters of peritoneal fluid. Electronically Signed   By: Inez Catalina M.D.   On: 02/27/2016 14:26   US Paracentesis  02/19/2016  INDICATION: Concern for peritoneal carcinomatosis with malignant ascites. Please perform ultrasound-guided paracentesis for diagnostic and therapeutic purposes. EXAM: ULTRASOUND-GUIDED PARACENTESIS COMPARISON:  CT abdomen and pelvis - 04/19/2016 MEDICATIONS: None. COMPLICATIONS: None immediate. TECHNIQUE: Informed written consent was obtained from the patient after a discussion of the risks, benefits and alternatives to treatment. A timeout was performed prior to the initiation of the procedure. Initial ultrasound scanning demonstrates a large amount of ascites within the right lower abdominal quadrant. The right lower abdomen was prepped and draped in the  usual sterile fashion. 1% lidocaine with epinephrine was used for local anesthesia. An ultrasound image was saved for documentation purposed. An 8 Fr Safe-T-Centesis catheter was introduced. The paracentesis was performed. The catheter was removed and a dressing was applied. The patient tolerated the procedure well without immediate post procedural complication. FINDINGS: A total of approximately 10 liters of serous fluid was removed. Samples were sent to  the laboratory as requested by the clinical team. IMPRESSION: Successful ultrasound-guided paracentesis yielding 10 liters of peritoneal fluid. Electronically Signed   By: Sandi Mariscal M.D.   On: 02/19/2016 14:06   Dg Abd 2 Views  02/18/2016  CLINICAL DATA:  Lower abdominal pain for 1 month. Abdominal distention increased pain over the past 3 days. EXAM: ABDOMEN - 2 VIEW COMPARISON:  None. FINDINGS: The bowel gas pattern is normal. There is no evidence of free air. No radio-opaque calculi or other significant radiographic abnormality is seen. IMPRESSION: No acute abnormality. Electronically Signed   By: Inge Rise M.D.   On: 02/18/2016 10:49    ASSESSMENT: Stage IV adenocarcinoma of the colon with omental caking and peritoneal carcinomatosis.  PLAN:    1. Stage IV adenocarcinoma of the colon with omental caking and peritoneal carcinomatosis: Patient's biopsy was small and likely will not be able to do additional testing such as extended K-ras. Patient has been referred to surgery not only for port placement, but consideration for diverting colostomy to prevent obstruction. If patient undergoes colostomy, additional tissue can be obtained. Patient has expressed interest in initiating palliative chemotherapy as soon as possible. Return to clinic on March 26, 2016 to initiate cycle 1 of FOLFOX plus Avastin. This may be removed if patient undergoes colostomy. Plan to do a total of 12 cycles with reimaging after 6. CEA and CA-19-9 are within normal limits.  2. Pain: Patient was given a prescription for hydrocodone today. 3. Constipation: Continue stool softeners and patient was instructed to use OTC MiraLAX. 4. Difficulty sleeping: Patient was given a prescription for 5 mg Ambien as needed.   Approximately 30 minutes was spent in discussion of which greater than 50% was consultation.  Patient expressed understanding and was in agreement with this plan. He also understands that He can call clinic at  any time with any questions, concerns, or complaints.   Lloyd Huger, MD   03/16/2016 11:28 PM

## 2016-03-20 ENCOUNTER — Other Ambulatory Visit
Admission: RE | Admit: 2016-03-20 | Discharge: 2016-03-20 | Disposition: A | Discharge status: Home / Self Care | Payer: Commercial Managed Care - PPO | Source: Ambulatory Visit | Attending: Surgery | Admitting: Surgery

## 2016-03-20 ENCOUNTER — Inpatient Hospital Stay: Payer: Commercial Managed Care - PPO | Attending: Oncology

## 2016-03-20 ENCOUNTER — Ambulatory Visit (INDEPENDENT_AMBULATORY_CARE_PROVIDER_SITE_OTHER): Payer: Commercial Managed Care - PPO | Admitting: Surgery

## 2016-03-20 ENCOUNTER — Encounter: Payer: Self-pay | Admitting: Surgery

## 2016-03-20 ENCOUNTER — Telehealth: Payer: Self-pay

## 2016-03-20 VITALS — BP 144/97 | HR 106 | Temp 98.3°F | Ht 70.0 in | Wt 248.0 lb

## 2016-03-20 DIAGNOSIS — R140 Abdominal distension (gaseous): Secondary | ICD-10-CM | POA: Insufficient documentation

## 2016-03-20 DIAGNOSIS — G4700 Insomnia, unspecified: Secondary | ICD-10-CM | POA: Insufficient documentation

## 2016-03-20 DIAGNOSIS — C189 Malignant neoplasm of colon, unspecified: Secondary | ICD-10-CM | POA: Diagnosis not present

## 2016-03-20 DIAGNOSIS — R531 Weakness: Secondary | ICD-10-CM | POA: Insufficient documentation

## 2016-03-20 DIAGNOSIS — R188 Other ascites: Secondary | ICD-10-CM | POA: Insufficient documentation

## 2016-03-20 DIAGNOSIS — Z87891 Personal history of nicotine dependence: Secondary | ICD-10-CM | POA: Insufficient documentation

## 2016-03-20 DIAGNOSIS — R180 Malignant ascites: Secondary | ICD-10-CM

## 2016-03-20 DIAGNOSIS — R109 Unspecified abdominal pain: Secondary | ICD-10-CM | POA: Insufficient documentation

## 2016-03-20 DIAGNOSIS — R590 Localized enlarged lymph nodes: Secondary | ICD-10-CM | POA: Insufficient documentation

## 2016-03-20 DIAGNOSIS — Z79899 Other long term (current) drug therapy: Secondary | ICD-10-CM | POA: Insufficient documentation

## 2016-03-20 DIAGNOSIS — C182 Malignant neoplasm of ascending colon: Secondary | ICD-10-CM | POA: Insufficient documentation

## 2016-03-20 DIAGNOSIS — R5383 Other fatigue: Secondary | ICD-10-CM | POA: Insufficient documentation

## 2016-03-20 DIAGNOSIS — K5900 Constipation, unspecified: Secondary | ICD-10-CM | POA: Insufficient documentation

## 2016-03-20 DIAGNOSIS — F419 Anxiety disorder, unspecified: Secondary | ICD-10-CM | POA: Insufficient documentation

## 2016-03-20 DIAGNOSIS — Z5111 Encounter for antineoplastic chemotherapy: Secondary | ICD-10-CM | POA: Insufficient documentation

## 2016-03-20 DIAGNOSIS — Z800 Family history of malignant neoplasm of digestive organs: Secondary | ICD-10-CM | POA: Insufficient documentation

## 2016-03-20 DIAGNOSIS — C786 Secondary malignant neoplasm of retroperitoneum and peritoneum: Secondary | ICD-10-CM | POA: Insufficient documentation

## 2016-03-20 LAB — PROTIME-INR
INR: 1.1
Prothrombin Time: 14.4 seconds (ref 11.4–15.0)

## 2016-03-20 LAB — APTT: APTT: 40 s — AB (ref 24–36)

## 2016-03-20 NOTE — Progress Notes (Signed)
Patient ID: Andrew Ibarra, male   DOB: [birth year], 50 y.o.   MRN: [id number]  History of Present Illness Andrew Ibarra is a 50 y.o. male seen in consultation for port basement and contemplation of diverting ileostomy. 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 He has underwent multiple decompressive paracentesis and His workup has included a colonoscopy revealing a near obstructing lesion in the cecum consistent with adenocarcinoma. He also has had cytology of the paracentesis consistent with malignant ascites. He does have some fullness and and some intermittent mild abdominal dull pain. He denies any nausea or vomiting and he is passing gas. He also reports some constipation. There is no evidence of bowel obstruction at this time. I have personally reviewed all the images including a CT scan of the abdomen and pelvis revealing massive ascites with multiple peritoneal implants. He does have some dyspnea on exertion, he has been started on Lasix to control his ascites but this has been unsuccessful.  Past Medical History Past Medical History  Diagnosis Date  . Shortness of breath dyspnea     on exertion  . Loosening of tooth     top front  . Cancer [facility])     suspected colon cancer     Past Surgical History  Procedure Laterality Date  . Right foot surgery  2001  . Colonoscopy with propofol N/A 03/06/2016    Procedure: COLONOSCOPY WITH PROPOFOL;  Surgeon: Lucilla Lame, MD;  Location: [place];  Service: Endoscopy;  Laterality: N/A;  . Polypectomy  03/06/2016    Procedure: POLYPECTOMY;  Surgeon: Lucilla Lame, MD;  Location: [place];  Service: Endoscopy;;    No Known Allergies  Current Outpatient Prescriptions  Medication Sig Dispense Refill  . furosemide (LASIX) 20 MG tablet Take 1 tablet (20 mg total) by mouth daily as needed for fluid or edema. (Patient taking differently: Take 20 mg by mouth daily as needed  for fluid or edema. am) 30 tablet 0  . HYDROcodone-acetaminophen (NORCO/VICODIN) 5-325 MG tablet Take 1 tablet by mouth every 4 (four) hours as needed for moderate pain. 60 tablet 0  . lidocaine-prilocaine (EMLA) cream Apply to affected area once 30 g 3  . polyethylene glycol (MIRALAX / GLYCOLAX) packet Take 17 g by mouth daily.     No current facility-administered medications for this visit.    Family History Family History  Problem Relation Age of Onset  . Pancreatic cancer Maternal Grandfather   . Lymphoma Maternal Grandmother   . CAD Father   . Heart disease Father     Social History Social History  Substance Use Topics  . Smoking status: Current Every Day Smoker -- 0.50 packs/day for 35 years    Types: Cigarettes  . Smokeless tobacco: Never Used     Comment: has more than 30 pack years history  . Alcohol Use: No     ROS 10 pt ROS performed and is otherwise negative  Physical Exam Blood pressure 144/97, pulse 106, temperature 98.3 F (36.8 C), temperature source Oral, height 5\' 10"  (1.778 m), weight 112.492 kg (248 lb).  CONSTITUTIONAL: NAD chronically ill EYES: Pupils equal, round, and reactive to light, Sclera non-icteric. EARS, NOSE, MOUTH AND THROAT: The oropharynx is clear. Oral mucosa is pink and moist. Hearing is intact to voice.  NECK: Trachea is midline, and there is no jugular venous distension. Thyroid is without palpable abnormalities. LYMPH NODES:  Lymph nodes in the neck are not  enlarged. RESPIRATORY:  Lungs are clear, and breath sounds are equal bilaterally. Normal respiratory effort without pathologic use of accessory muscles. CARDIOVASCULAR: Heart is regular without murmurs, gallops, or rubs. GI: The abdomen is  soft, nontender,Tense Ascites MUSCULOSKELETAL:  Normal muscle strength and tone in all four extremities.    SKIN: Skin turgor is normal. There are no pathologic skin lesions.  NEUROLOGIC:  Motor and sensation is grossly normal.  Cranial nerves  are grossly intact. PSYCH:  Alert and oriented to person, place and time. Affect is normal.  Data Reviewed I have personally reviewed the patient's imaging and medical records.    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 Evaluated and I do not think that an ileostomy would be his best interest. #1 controlling the ascites would be very challenging A vent abdominal operation is attempted. #2 and electrolyte abnormalities in the setting of an ileostomy would be very difficult to control. Obviously radical resection is not indicated given the extent of his disease. Discussed with the patient in detail about the circumstances and he does not want an ileostomy at this time. I do agree with aggressive chemotherapy and we will arrange for port placement next week by my partner Dr. Pat Patrick ( . Discussed with patient in detail about the procedure, risks benefits and possible complications including but not limited to: Bleeding, pneumothorax, vascular injury and re-interventions. I do think from the ascites perspective he is better managed with aggressive diuresis and decompressive paracentesis. We'll go ahead and order an ultrasound-guided paracentesis for next few days so we can control his abdominal symptoms. No need for immediate surgical relation.  Regarding more tissue for further analysis from an oncologic perspective may consider image guided biopsy of peritoneal implants. His is a difficult case and obviously he carries a very poor prognosis given the extent of his disease. Extensive counseling provided    Caroleen Hamman, MD [place] 03/20/2016, 2:00 PM

## 2016-03-20 NOTE — Telephone Encounter (Signed)
Patient scheduled for paracentesis at [facility] 03/21/2016 at 8:00 am. Procedure will begin at 8:30am. You will need to have nothing to eat after midnight. If there is anything abnormal with your labs, we will call you back to let you know. If all labs are normal, then we will not call. Wait for a phone call from [place] concerning your port placement instructions. Patient understood.

## 2016-03-20 NOTE — Patient Instructions (Addendum)
We will call you as soon as your Paracentesis has been scheduled. We will call you with the appointment details.  Your port placement has been scheduled for 03/25/2016 with Dr. Pat Patrick at [facility]. Please refer to the blue pre-care form.   Paracentesis Paracentesis is a procedure to remove excess fluid (ascites) from the belly (abdomen). Ascites can result from certain conditions, such as infection, inflammation, abdominal injury, heart failure, chronic scarring of the liver (cirrhosis), or cancer. Ascites is removed using a needle that is inserted through the skin and tissue into the abdomen. This procedure may be done:  To determine the cause of the ascites.  To relieve symptoms that are caused by the ascites, such as pain or shortness of breath.  To see if there is bleeding after an abdominal injury. LET [facility] CARE PROVIDER KNOW ABOUT:  Any allergies you have.  All medicines you are taking, including vitamins, herbs, eye drops, creams, and over-the-counter medicines.  Previous problems you or members of your family have had with the use of anesthetics.  Any blood disorders you have.  Previous surgeries you have had.  Any medical conditions you have.  Whether you are pregnant or may be pregnant. RISKS AND COMPLICATIONS Generally, this is a safe procedure. However, problems may occur, including:  Infection.  Bleeding.  Injury to an abdominal organ, such as the bowel (large intestine), liver, spleen, or bladder.  Low blood pressure (hypotension).  Spreading of cancer, if there are cancer cells in the abdominal fluid.  Mental status changes in people who have liver disease. These changes would be caused by shifts in the balance of fluids and minerals (electrolytes) in the body. BEFORE THE PROCEDURE  Ask your health care provider about:  Changing or stopping your regular medicines. This is especially important if you are taking diabetes medicines or blood thinners.  Taking  medicines such as aspirin and ibuprofen. These medicines can thin your blood. Do not take these medicines before your procedure if your health care provider instructs you not to.  A blood sample may be done to determine your blood clotting time.  You will be asked to urinate. PROCEDURE  You may be asked to lie on your back with your head raised (elevated).  To reduce your risk of infection:  Your health care team will wash or sanitize their hands.  Your skin will be washed with soap.  You will be given a medicine to numb the area (local anesthetic).  Your abdominal skin will be punctured with a needle or a scalpel.  A drainage tube will be inserted through the puncture site. Fluid will drain through the tube into a container.  After enough fluid has been removed, the tube will be removed.  A sample of the fluid will be sent for examination.  A bandage (dressing) will be placed over the puncture site. The procedure may vary among health care providers and hospitals. AFTER THE PROCEDURE  It is your responsibility to get your test results. Ask your health care provider or the department performing the test when your results will be ready.   This information is not intended to replace advice given to you by your health care provider. Make sure you discuss any questions you have with your health care provider.   Document Released: 03/17/2005 Document Revised: 05/23/2015 Document Reviewed: 11/14/2014 Elsevier Interactive Patient Education [employer].

## 2016-03-21 ENCOUNTER — Telehealth: Payer: Self-pay | Admitting: Surgery

## 2016-03-21 ENCOUNTER — Other Ambulatory Visit: Payer: Commercial Managed Care - PPO

## 2016-03-21 ENCOUNTER — Ambulatory Visit
Admission: RE | Admit: 2016-03-21 | Discharge: 2016-03-21 | Disposition: A | Discharge status: Home / Self Care | Payer: Commercial Managed Care - PPO | Source: Ambulatory Visit | Attending: Surgery | Admitting: Surgery

## 2016-03-21 DIAGNOSIS — C189 Malignant neoplasm of colon, unspecified: Secondary | ICD-10-CM

## 2016-03-21 DIAGNOSIS — R188 Other ascites: Secondary | ICD-10-CM | POA: Insufficient documentation

## 2016-03-21 NOTE — Pre-Procedure Instructions (Signed)
CALLED DR [facility] ABOUT ABNORMAL EKG FROM ER ON 02-18-16 WITH AN OLD INFARCT PRESENT-MEDICAL CLEARANCE NEEDED-CALLED AMBER AND SPOKE WITH HER ABOUT CLEARANCE-SHE SAID THAT ALL CAN[facility]ER CENTER PTS HAVE TO BE CLEARED BY THEIR CA MD SINCE THAT IS THEIR PCP-  AMBER GAVE ME DR Virgel Manifold NURSE'S # (KENDRA) AND IF I COULD NOT GET IN TOUCH WITH HER TO CALL [place].  I CALLED BOTH #'S REPEATEDLY AND THE ASCOM RANG CONTINUOUSLY- I CALLED ANGIE AT DR [facility] AND RELAYED THIS INFO TO HER- SHE IS GOING TO CALL THE CA CENTER AND SEE WHAT SHE CAN FIND OUT

## 2016-03-21 NOTE — Telephone Encounter (Signed)
I have called patient to go over information below. No answer. I have left a message.   Pt advised of pre op date/time and sx date. Sx: 03/25/16 with Dr Ely--Port Placement. Pre op: 03/21/16 between 1-5pm--phone.   Patient made aware to call [phone], between 1-3:00pm the day before surgery, to find out what time to arrive.

## 2016-03-21 NOTE — Telephone Encounter (Signed)
Patient has called back and was advised of surgery information.

## 2016-03-21 NOTE — Procedures (Signed)
Successful US guided paracentesis.  Removed 5 liters of yellow fluid without complication.

## 2016-03-21 NOTE — Pre-Procedure Instructions (Signed)
ANGIE CALLED ME BACK AND SAID THAT ALL THE NURSES ARE AT [facility]-SHE TALKED WITH HALEY RN AND HALEY IS AWARE THAT PT NEEDS TO BE CLEARED BY DR Renie Ora FAXED THIS OVER TO DR Grayland Ormond AND ALSO OVER TO [facility] WITH FAX CONFIRMATION RECIEVED

## 2016-03-24 ENCOUNTER — Encounter: Payer: Self-pay | Admitting: *Deleted

## 2016-03-24 NOTE — Patient Instructions (Signed)
  Your procedure is scheduled on: 03-25-16 Report to Same Day Surgery 2nd floor medical mall @ To find out your arrival time please call [phone] between 1PM - 3PM on   Remember: Instructions that are not followed completely may result in serious medical risk, up to and including death, or upon the discretion of your surgeon and anesthesiologist your surgery may need to be rescheduled.    _x___ 1. Do not eat food or drink liquids after midnight. No gum chewing or hard candies.     __x__ 2. No Alcohol for 24 hours before or after surgery.   __x__3. No Smoking for 24 prior to surgery.   ____  4. Bring all medications with you on the day of surgery if instructed.    __x__ 5. Notify your doctor if there is any change in your medical condition     (cold, fever, infections).     Do not wear jewelry, make-up, hairpins, clips or nail polish.  Do not wear lotions, powders, or perfumes. You may wear deodorant.  Do not shave 48 hours prior to surgery. Men may shave face and neck.  Do not bring valuables to the hospital.    [facility] is not responsible for any belongings or valuables.               Contacts, dentures or bridgework may not be worn into surgery.  Leave your suitcase in the car. After surgery it may be brought to your room.  For patients admitted to the hospital, discharge time is determined by your treatment team.   Patients discharged the day of surgery will not be allowed to drive home.    Please read over the following fact sheets that you were given:   [facility] Preparing for Surgery and or MRSA Information   ____ Take these medicines the morning of surgery with A SIP OF WATER:    1. NONE  2.  3.  4.  5.  6.  ____ Fleet Enema (as directed)   _x___ Use CHG Soap or sage wipes as directed on instruction sheet   ____ Use inhalers on the day of surgery and bring to hospital day of surgery  ____ Stop metformin 2 days prior to surgery    ____ Take 1/2 of  usual insulin dose the night before surgery and none on the morning of surgery.   ____ Stop aspirin or coumadin, or plavix  ___ Stop Anti-inflammatories such as Advil, Aleve, Ibuprofen, Motrin, Naproxen,          Naprosyn, Goodies powders or aspirin products. Ok to take Tylenol.   ____ Stop supplements until after surgery.    ____ Bring C-Pap to the hospital.

## 2016-03-24 NOTE — Pre-Procedure Instructions (Signed)
CALLED AND SPOKE WITH AMY AT DR [facility] OFFICE REGARDING STATUS OF CLEARANCE-SHE SAID THAT [facility] WAS ON THE OTHER LINE BUT THAT THEY HAD NOT RECEIVED CLEARANCE YET (PT TOLD ME THAT LARRY SYKES HAD CLEARED HIM)

## 2016-03-24 NOTE — Pre-Procedure Instructions (Signed)
RECEIVED MEDICAL CLEARANCE FROM Othelia Pulling (Melvina)

## 2016-03-24 NOTE — Pre-Procedure Instructions (Signed)
ANGIE CALLED BACK AND SAID THAT PT SEES LARRY SYKES PA AT URGENT CARE IN [place]- ANGIE SAID SHE SPOKE DIRECTLY WITH LARRY SYKES PA AND SHE WAS GOING TO FAX OVER TO HIM REQUEST FOR CLEARANCE AND EKG AND HE SAID HE WOULD REVIEW IT- I CALLED PT TO DO HIS PHONE INTERVIEW AND HE SAID HE WAS ON HIS WAY TO SEE LARRY SYKES TO BE CLEARED FOR HIS SURGERY-PT TO CALL ME BACK AND DO PHONE INTERVIEW ONCE HE GETS OUT OF PCP OFFICE

## 2016-03-24 NOTE — Pre-Procedure Instructions (Signed)
RECEIVED CLEARANCE FROM DR Grayland Ormond- I TOLD ANGIE AT DR [facility]  OFFICE THAT I DONT THINK THAT ANESTHESIA WILL GO FOR A MEDICAL CLEARANCE FOR AN EKG FROM AN ONCOLOGY DOCTOR-ANGIE IS GOING TO CALL PT AND SEE IF HE HAS A PCP THAT CAN CLEAR HIM

## 2016-03-24 NOTE — Pre-Procedure Instructions (Signed)
CALLED AGAIN AND SPOKE WITH TINA AT LARRY SYKES OFFICE AND SHE SAID THAT SHE HAD ALREADY FAXED IT OVER TO DR [facility] OFFICE-  I ASKED HER TO FAX IT OVER TO ME IN [facility]-I GAVE HER MY FAX # AND SHE SAID SHE WILL FAX IT

## 2016-03-24 NOTE — Pre-Procedure Instructions (Addendum)
CALLED BACK OVER TO [facility] TO SEE IF THEY RECEIVED THE CLEARANCE YET AND SHE SAID THAT SHE HAD LEFT A MESSAGE FOR THEM TO SEND IT BUT THEY HAD NOT RECEIVED IT YET- I TOLD HER THAT PT TOLD ME HE WAS CLEARED FOR HIS SURGERY TOMORROW-I ASKED HER TO GIVE ME THE # SO I COULD CALL THEM-SHE GAVE ME [phone] AND I CALLED THAT # AND HAD TO LEAVE A MESSAGE ALSO.  I CHECKED AGAIN ON THE FAX MACHINE AND NOTHING HAS BEEN SENT

## 2016-03-25 ENCOUNTER — Encounter: Payer: Self-pay | Admitting: *Deleted

## 2016-03-25 ENCOUNTER — Ambulatory Visit: Payer: Commercial Managed Care - PPO

## 2016-03-25 ENCOUNTER — Encounter
Admission: RE | Disposition: A | Discharge status: Home / Self Care | Payer: Self-pay | Source: Ambulatory Visit | Attending: Surgery

## 2016-03-25 ENCOUNTER — Ambulatory Visit: Payer: Commercial Managed Care - PPO | Admitting: Registered Nurse

## 2016-03-25 ENCOUNTER — Ambulatory Visit
Admission: RE | Admit: 2016-03-25 | Discharge: 2016-03-25 | Disposition: A | Discharge status: Home / Self Care | Payer: Commercial Managed Care - PPO | Source: Ambulatory Visit | Attending: Surgery | Admitting: Surgery

## 2016-03-25 DIAGNOSIS — C189 Malignant neoplasm of colon, unspecified: Secondary | ICD-10-CM | POA: Diagnosis not present

## 2016-03-25 DIAGNOSIS — R188 Other ascites: Secondary | ICD-10-CM | POA: Insufficient documentation

## 2016-03-25 DIAGNOSIS — K5900 Constipation, unspecified: Secondary | ICD-10-CM | POA: Diagnosis not present

## 2016-03-25 DIAGNOSIS — Z452 Encounter for adjustment and management of vascular access device: Secondary | ICD-10-CM

## 2016-03-25 DIAGNOSIS — Z8249 Family history of ischemic heart disease and other diseases of the circulatory system: Secondary | ICD-10-CM | POA: Diagnosis not present

## 2016-03-25 DIAGNOSIS — F17210 Nicotine dependence, cigarettes, uncomplicated: Secondary | ICD-10-CM | POA: Insufficient documentation

## 2016-03-25 DIAGNOSIS — Z807 Family history of other malignant neoplasms of lymphoid, hematopoietic and related tissues: Secondary | ICD-10-CM | POA: Diagnosis not present

## 2016-03-25 DIAGNOSIS — Z79899 Other long term (current) drug therapy: Secondary | ICD-10-CM | POA: Diagnosis not present

## 2016-03-25 DIAGNOSIS — C180 Malignant neoplasm of cecum: Secondary | ICD-10-CM | POA: Insufficient documentation

## 2016-03-25 DIAGNOSIS — Z86010 Personal history of colonic polyps: Secondary | ICD-10-CM | POA: Diagnosis not present

## 2016-03-25 DIAGNOSIS — R0602 Shortness of breath: Secondary | ICD-10-CM | POA: Insufficient documentation

## 2016-03-25 DIAGNOSIS — Z800 Family history of malignant neoplasm of digestive organs: Secondary | ICD-10-CM | POA: Insufficient documentation

## 2016-03-25 DIAGNOSIS — C786 Secondary malignant neoplasm of retroperitoneum and peritoneum: Secondary | ICD-10-CM | POA: Diagnosis not present

## 2016-03-25 HISTORY — PX: PORTACATH PLACEMENT: SHX2246

## 2016-03-25 HISTORY — DX: Headache, unspecified: R51.9

## 2016-03-25 HISTORY — DX: Headache: R51

## 2016-03-25 SURGERY — INSERTION, TUNNELED CENTRAL VENOUS DEVICE, WITH PORT
Anesthesia: Monitor Anesthesia Care | Laterality: Right | Wound class: Clean

## 2016-03-25 MED ORDER — LIDOCAINE HCL (PF) 1 % IJ SOLN
INTRAMUSCULAR | Status: DC | PRN
  Administered 2016-03-25: 10 mL

## 2016-03-25 MED ORDER — HEPARIN SODIUM (PORCINE) 5000 UNIT/ML IJ SOLN
INTRAMUSCULAR | Status: AC
  Filled 2016-03-25: qty 1

## 2016-03-25 MED ORDER — MIDAZOLAM HCL 2 MG/2ML IJ SOLN
INTRAMUSCULAR | Status: DC | PRN
  Administered 2016-03-25: 2 mg via INTRAVENOUS

## 2016-03-25 MED ORDER — SODIUM CHLORIDE 0.9 % IV SOLN
INTRAVENOUS | Status: DC | PRN
  Administered 2016-03-25: 10 mL via INTRAMUSCULAR

## 2016-03-25 MED ORDER — IOTHALAMATE MEGLUMINE 60 % INJ SOLN
INTRAMUSCULAR | Status: DC | PRN
  Administered 2016-03-25: 7 mL

## 2016-03-25 MED ORDER — LACTATED RINGERS IV SOLN
INTRAVENOUS | Status: DC
  Administered 2016-03-25: 07:00:00 via INTRAVENOUS

## 2016-03-25 MED ORDER — SODIUM CHLORIDE 0.9 % IJ SOLN
INTRAMUSCULAR | Status: AC
  Filled 2016-03-25: qty 50

## 2016-03-25 MED ORDER — FENTANYL CITRATE (PF) 100 MCG/2ML IJ SOLN
INTRAMUSCULAR | Status: DC | PRN
  Administered 2016-03-25: 50 ug via INTRAVENOUS

## 2016-03-25 MED ORDER — CHLORHEXIDINE GLUCONATE CLOTH 2 % EX PADS
6.0000 | MEDICATED_PAD | Freq: Once | CUTANEOUS | Status: AC
  Administered 2016-03-25: 6 via TOPICAL

## 2016-03-25 MED ORDER — KETAMINE HCL 50 MG/ML IJ SOLN
INTRAMUSCULAR | Status: DC | PRN
  Administered 2016-03-25: 50 mg via INTRAMUSCULAR

## 2016-03-25 MED ORDER — FENTANYL CITRATE (PF) 100 MCG/2ML IJ SOLN: 25.0000 ug | INTRAMUSCULAR | Status: DC | PRN

## 2016-03-25 MED ORDER — CEFAZOLIN SODIUM-DEXTROSE 2-4 GM/100ML-% IV SOLN
INTRAVENOUS | Status: AC
  Filled 2016-03-25: qty 100

## 2016-03-25 MED ORDER — CEFAZOLIN SODIUM-DEXTROSE 2-4 GM/100ML-% IV SOLN
2.0000 g | INTRAVENOUS | Status: AC
  Administered 2016-03-25: 2 g via INTRAVENOUS

## 2016-03-25 MED ORDER — ONDANSETRON HCL 4 MG/2ML IJ SOLN: 4.0000 mg | Freq: Once | INTRAMUSCULAR | Status: DC | PRN

## 2016-03-25 MED ORDER — FAMOTIDINE 20 MG PO TABS
20.0000 mg | ORAL_TABLET | Freq: Once | ORAL | Status: AC
  Administered 2016-03-25: 20 mg via ORAL

## 2016-03-25 MED ORDER — GLYCOPYRROLATE 0.2 MG/ML IJ SOLN
INTRAMUSCULAR | Status: DC | PRN
  Administered 2016-03-25: 0.2 mg via INTRAVENOUS

## 2016-03-25 MED ORDER — FAMOTIDINE 20 MG PO TABS
ORAL_TABLET | ORAL | Status: AC
  Administered 2016-03-25: 20 mg via ORAL
  Filled 2016-03-25: qty 1

## 2016-03-25 MED ORDER — PROPOFOL 500 MG/50ML IV EMUL
INTRAVENOUS | Status: DC | PRN
  Administered 2016-03-25: 150 ug/kg/min via INTRAVENOUS

## 2016-03-25 MED ORDER — LIDOCAINE HCL (PF) 1 % IJ SOLN
INTRAMUSCULAR | Status: AC
  Filled 2016-03-25: qty 30

## 2016-03-25 MED ORDER — PROPOFOL 10 MG/ML IV BOLUS
INTRAVENOUS | Status: DC | PRN
  Administered 2016-03-25: 50 mg via INTRAVENOUS

## 2016-03-25 MED ORDER — CHLORHEXIDINE GLUCONATE CLOTH 2 % EX PADS: 6.0000 | MEDICATED_PAD | Freq: Once | CUTANEOUS | Status: DC

## 2016-03-25 SURGICAL SUPPLY — 31 items
CANISTER SUCT 1200ML W/VALVE (MISCELLANEOUS) ×3 IMPLANT
CHLORAPREP W/TINT 26ML (MISCELLANEOUS) ×3 IMPLANT
COVER LIGHT HANDLE STERIS (MISCELLANEOUS) ×6 IMPLANT
DRAPE C-ARM XRAY 36X54 (DRAPES) ×3 IMPLANT
DRESSING TELFA 4X3 1S ST N-ADH (GAUZE/BANDAGES/DRESSINGS) ×3 IMPLANT
DRSG TEGADERM 2-3/8X2-3/4 SM (GAUZE/BANDAGES/DRESSINGS) ×3 IMPLANT
DRSG TEGADERM 4X4.75 (GAUZE/BANDAGES/DRESSINGS) ×3 IMPLANT
ELECT CAUTERY NEEDLE TIP 1.0 (MISCELLANEOUS) ×3
ELECT REM PT RETURN 9FT ADLT (ELECTROSURGICAL) ×3
ELECTRODE CAUTERY NEDL TIP 1.0 (MISCELLANEOUS) ×1 IMPLANT
ELECTRODE REM PT RTRN 9FT ADLT (ELECTROSURGICAL) ×1 IMPLANT
GAUZE SPONGE 4X4 12PLY STRL (GAUZE/BANDAGES/DRESSINGS) IMPLANT
GLOVE BIO SURGEON STRL SZ7.5 (GLOVE) ×12 IMPLANT
GOWN STRL REUS W/ TWL LRG LVL3 (GOWN DISPOSABLE) ×2 IMPLANT
GOWN STRL REUS W/TWL LRG LVL3 (GOWN DISPOSABLE) ×4
KIT PORT POWER 8FR ISP CVUE (Catheter) ×3 IMPLANT
KIT RM TURNOVER STRD PROC AR (KITS) ×3 IMPLANT
LABEL OR SOLS (LABEL) ×3 IMPLANT
MICROPUNCTURE 5FR NT-U-SST (MISCELLANEOUS) ×3
NEEDLE FILTER BLUNT 18X 1/2SAF (NEEDLE) ×2
NEEDLE FILTER BLUNT 18X1 1/2 (NEEDLE) ×1 IMPLANT
PACK PORT-A-CATH (MISCELLANEOUS) ×3 IMPLANT
SET MICROPUNCTURE 5FR NT-U-SST (MISCELLANEOUS) ×1 IMPLANT
SUT ETHILON 4-0 (SUTURE) ×2
SUT ETHILON 4-0 FS2 18XMFL BLK (SUTURE) ×1
SUT SILK 3 0 SH 30 (SUTURE) ×6 IMPLANT
SUT VIC AB 3-0 SH 27 (SUTURE) ×2
SUT VIC AB 3-0 SH 27X BRD (SUTURE) ×1 IMPLANT
SUTURE ETHLN 4-0 FS2 18XMF BLK (SUTURE) ×1 IMPLANT
SYR 3ML LL SCALE MARK (SYRINGE) ×3 IMPLANT
SYRINGE 10CC LL (SYRINGE) ×3 IMPLANT

## 2016-03-25 NOTE — Discharge Instructions (Signed)
AMBULATORY SURGERY  °DISCHARGE INSTRUCTIONS ° ° °1) The drugs that you were given will stay in your system until tomorrow so for the next 24 hours you should not: ° °A) Drive an automobile °B) Make any legal decisions °C) Drink any alcoholic beverage ° ° °2) You may resume regular meals tomorrow.  Today it is better to start with liquids and gradually work up to solid foods. ° °You may eat anything you prefer, but it is better to start with liquids, then soup and crackers, and gradually work up to solid foods. ° ° °3) Please notify your doctor immediately if you have any unusual bleeding, trouble breathing, redness and pain at the surgery site, drainage, fever, or pain not relieved by medication. ° ° ° °4) Additional Instructions: ° ° ° ° ° ° ° °Please contact your physician with any problems or Same Day Surgery at [phone], Monday through Friday 6 am to 4 pm, or [place] [facility] [place] Main number at [phone].Implanted Port Home Guide °An implanted port is a type of central line that is placed under the skin. Central lines are used to provide IV access when treatment or nutrition needs to be given through a person's veins. Implanted ports are used for long-term IV access. An implanted port may be placed because:  °· You need IV medicine that would be irritating to the small veins in your hands or arms.   °· You need long-term IV medicines, such as antibiotics.   °· You need IV nutrition for a long period.   °· You need frequent blood draws for lab tests.   °· You need dialysis.   °Implanted ports are usually placed in the chest area, but they can also be placed in the upper arm, the abdomen, or the leg. An implanted port has two main parts:  °· Reservoir. The reservoir is round and will appear as a small, raised area under your skin. The reservoir is the part where a needle is inserted to give medicines or draw blood.   °· Catheter. The catheter is a thin, flexible tube that extends from the reservoir. The  catheter is placed into a large vein. Medicine that is inserted into the reservoir goes into the catheter and then into the vein.   °HOW WILL I CARE FOR MY INCISION SITE? °Do not get the incision site wet. Bathe or shower as directed by your health care provider.  °HOW IS MY PORT ACCESSED? °Special steps must be taken to access the port:  °· Before the port is accessed, a numbing cream can be placed on the skin. This helps numb the skin over the port site.   °· Your health care provider uses a sterile technique to access the port. °¨ Your health care provider must put on a mask and sterile gloves. °¨ The skin over your port is cleaned carefully with an antiseptic and allowed to dry. °¨ The port is gently pinched between sterile gloves, and a needle is inserted into the port. °· Only "non-coring" port needles should be used to access the port. Once the port is accessed, a blood return should be checked. This helps ensure that the port is in the vein and is not clogged.   °· If your port needs to remain accessed for a constant infusion, a clear (transparent) bandage will be placed over the needle site. The bandage and needle will need to be changed every week, or as directed by your health care provider.   °· Keep the bandage covering the needle clean and dry.   Do not get it wet. Follow your health care provider's instructions on how to take a shower or bath while the port is accessed.   °· If your port does not need to stay accessed, no bandage is needed over the port.   °WHAT IS FLUSHING? °Flushing helps keep the port from getting clogged. Follow your health care provider's instructions on how and when to flush the port. Ports are usually flushed with saline solution or a medicine called heparin. The need for flushing will depend on how the port is used.  °· If the port is used for intermittent medicines or blood draws, the port will need to be flushed:   °¨ After medicines have been given.   °¨ After blood has been  drawn.   °¨ As part of routine maintenance.   °· If a constant infusion is running, the port may not need to be flushed.   °HOW LONG WILL MY PORT STAY IMPLANTED? °The port can stay in for as long as your health care provider thinks it is needed. When it is time for the port to come out, surgery will be done to remove it. The procedure is similar to the one performed when the port was put in.  °WHEN SHOULD I SEEK IMMEDIATE MEDICAL CARE? °When you have an implanted port, you should seek immediate medical care if:  °· You notice a bad smell coming from the incision site.   °· You have swelling, redness, or drainage at the incision site.   °· You have more swelling or pain at the port site or the surrounding area.   °· You have a fever that is not controlled with medicine. °  °This information is not intended to replace advice given to you by your health care provider. Make sure you discuss any questions you have with your health care provider. °  °Document Released: 09/01/2005 Document Revised: 06/22/2013 Document Reviewed: 05/09/2013 °Elsevier Interactive Patient Education ©2016 [employer] ° °

## 2016-03-25 NOTE — Anesthesia Preprocedure Evaluation (Signed)
Anesthesia Evaluation  Patient identified by MRN, date of birth, ID band Patient awake    Reviewed: Allergy & Precautions, H&P , NPO status , Patient's Chart, lab work & pertinent test results, reviewed documented beta blocker date and time   Airway Mallampati: III  TM Distance: >3 FB Neck ROM: full    Dental no notable dental hx. (+) Teeth Intact   Pulmonary neg pulmonary ROS, shortness of breath and with exertion, Current Smoker,    Pulmonary exam normal breath sounds clear to auscultation       Cardiovascular Exercise Tolerance: Good hypertension, On Medications negative cardio ROS   Rhythm:regular Rate:Normal     Neuro/Psych  Headaches, negative neurological ROS  negative psych ROS   GI/Hepatic negative GI ROS, Neg liver ROS,   Endo/Other  negative endocrine ROSdiabetes  Renal/GU      Musculoskeletal   Abdominal   Peds  Hematology negative hematology ROS (+)   Anesthesia Other Findings   Reproductive/Obstetrics negative OB ROS                             Anesthesia Physical Anesthesia Plan  ASA: III  Anesthesia Plan: MAC   Post-op Pain Management:    Induction:   Airway Management Planned:   Additional Equipment:   Intra-op Plan:   Post-operative Plan:   Informed Consent: I have reviewed the patients History and Physical, chart, labs and discussed the procedure including the risks, benefits and alternatives for the proposed anesthesia with the patient or authorized representative who has indicated his/her understanding and acceptance.     Plan Discussed with: CRNA  Anesthesia Plan Comments:         Anesthesia Quick Evaluation

## 2016-03-25 NOTE — Op Note (Signed)
03/25/2016  8:19 AM  PATIENT:  Andrew Ibarra  50 y.o. male  PRE-OPERATIVE DIAGNOSIS:  Colon Cancer  POST-OPERATIVE DIAGNOSIS:  Colon Cancer  PROCEDURE:  Procedure(s): INSERTION PORT-A-CATH (Right)  SURGEON:  Surgeon(s) and Role:    * [name], MD - Primary   ASSISTANTS: none   ANESTHESIA:   MAC  EBL:      DRAINS: none   LOCAL MEDICATIONS USED:  LIDOCAINE    DISPOSITION OF SPECIMEN:  N/A   DICTATION: .Dragon Dictation with the patient in the supine position and after induction of appropriate intravenous sedation the patient's right neck and chest were prepped with ChloraPrep and draped sterile towels. 1% Xylocaine was injected into the right neck and a small 2 cm incision made down through the subcutaneous tissue. Using ultrasound guidance the right internal jugular vein was cannulated on single pass a wire passed and the great vessels system using fluoroscopic control.  1% Xylocaine was injected in a transverse fashion across the chest wall and a small incision made along the chest to create the pocket. A subcutaneous suprafascial pocket was created without difficulty. The wire was then used to guide an introducer and the great vessels system and the dilator and wire were removed. Heparin filled catheter was inserted through the introducer and the great vessels system and positioned just above the atrium using fluoroscopy and contrast. The catheter was then tunneled to the second incision were was passed to have prefilled Port-A-Cath device. Conray was then used to demonstrate the lack of any leaks and confirmed the position.  The device is sutured to the chest wall 2 using 3-0 silk. The device was filled with heparinized saline. Subcutaneous space was obliterated with 3-0 Vicryl both incisions in both skin incisions were closed with 4-0 nylon. Sterile dressings were applied. Patient returned recovery room having tolerated the procedure well. Sponge instrument needle count  were correct 2 in the operating room  PLAN OF CARE: Discharge to home after PACU  PATIENT DISPOSITION:  PACU - hemodynamically stable.   [name], MD

## 2016-03-25 NOTE — Transfer of Care (Signed)
Immediate Anesthesia Transfer of Care Note  Patient: Andrew Ibarra  Procedure(s) Performed: Procedure(s): INSERTION PORT-A-CATH (Right)  Patient Location: PACU  Anesthesia Type:General  Level of Consciousness: sedated  Airway & Oxygen Therapy: Patient Spontanous Breathing and Patient connected to face mask oxygen  Post-op Assessment: Report given to RN and Post -op Vital signs reviewed and stable  Post vital signs: Reviewed and stable  Last Vitals:  Filed Vitals:   03/25/16 0628 03/25/16 0819  BP: 132/91 110/78  Pulse: 92 84  Temp: 36.1 C 36.3 C  Resp: 20 21    Complications: No apparent anesthesia complications

## 2016-03-25 NOTE — Interval H&P Note (Signed)
History and Physical Interval Note:  03/25/2016 7:12 AM  Andrew Ibarra  has presented today for surgery, with the diagnosis of CA  The various methods of treatment have been discussed with the patient and family. After consideration of risks, benefits and other options for treatment, the patient has consented to  Procedure(s): INSERTION PORT-A-CATH (N/A) as a surgical intervention .  The patient's history has been reviewed, patient examined, no change in status, stable for surgery.  I have reviewed the patient's chart and labs.  Questions were answered to the patient's satisfaction.     [name]

## 2016-03-25 NOTE — H&P (View-Only) (Signed)
Patient ID: Andrew Ibarra, male   DOB: 1965-12-24, 50 y.o.   MRN: [id number]  History of Present Illness Andrew Ibarra is a 50 y.o. male seen in consultation for port basement and contemplation of diverting ileostomy. 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 He has underwent multiple decompressive paracentesis and His workup has included a colonoscopy revealing a near obstructing lesion in the cecum consistent with adenocarcinoma. He also has had cytology of the paracentesis consistent with malignant ascites. He does have some fullness and and some intermittent mild abdominal dull pain. He denies any nausea or vomiting and he is passing gas. He also reports some constipation. There is no evidence of bowel obstruction at this time. I have personally reviewed all the images including a CT scan of the abdomen and pelvis revealing massive ascites with multiple peritoneal implants. He does have some dyspnea on exertion, he has been started on Lasix to control his ascites but this has been unsuccessful.  Past Medical History Past Medical History  Diagnosis Date  . Shortness of breath dyspnea     on exertion  . Loosening of tooth     top front  . Cancer [facility])     suspected colon cancer     Past Surgical History  Procedure Laterality Date  . Right foot surgery  2001  . Colonoscopy with propofol N/A 03/06/2016    Procedure: COLONOSCOPY WITH PROPOFOL;  Surgeon: Lucilla Lame, MD;  Location: [facility];  Service: Endoscopy;  Laterality: N/A;  . Polypectomy  03/06/2016    Procedure: POLYPECTOMY;  Surgeon: Lucilla Lame, MD;  Location: [facility];  Service: Endoscopy;;    No Known Allergies  Current Outpatient Prescriptions  Medication Sig Dispense Refill  . furosemide (LASIX) 20 MG tablet Take 1 tablet (20 mg total) by mouth daily as needed for fluid or edema. (Patient taking differently: Take 20 mg by mouth daily as needed  for fluid or edema. am) 30 tablet 0  . HYDROcodone-acetaminophen (NORCO/VICODIN) 5-325 MG tablet Take 1 tablet by mouth every 4 (four) hours as needed for moderate pain. 60 tablet 0  . lidocaine-prilocaine (EMLA) cream Apply to affected area once 30 g 3  . polyethylene glycol (MIRALAX / GLYCOLAX) packet Take 17 g by mouth daily.     No current facility-administered medications for this visit.    Family History Family History  Problem Relation Age of Onset  . Pancreatic cancer Maternal Grandfather   . Lymphoma Maternal Grandmother   . CAD Father   . Heart disease Father     Social History Social History  Substance Use Topics  . Smoking status: Current Every Day Smoker -- 0.50 packs/day for 35 years    Types: Cigarettes  . Smokeless tobacco: Never Used     Comment: has more than 30 pack years history  . Alcohol Use: No     ROS 10 pt ROS performed and is otherwise negative  Physical Exam Blood pressure 144/97, pulse 106, temperature 98.3 F (36.8 C), temperature source Oral, height 5\' 10"  (1.778 m), weight 112.492 kg (248 lb).  CONSTITUTIONAL: NAD chronically ill EYES: Pupils equal, round, and reactive to light, Sclera non-icteric. EARS, NOSE, MOUTH AND THROAT: The oropharynx is clear. Oral mucosa is pink and moist. Hearing is intact to voice.  NECK: Trachea is midline, and there is no jugular venous distension. Thyroid is without palpable abnormalities. LYMPH NODES:  Lymph nodes in the neck are not  enlarged. RESPIRATORY:  Lungs are clear, and breath sounds are equal bilaterally. Normal respiratory effort without pathologic use of accessory muscles. CARDIOVASCULAR: Heart is regular without murmurs, gallops, or rubs. GI: The abdomen is  soft, nontender,Tense Ascites MUSCULOSKELETAL:  Normal muscle strength and tone in all four extremities.    SKIN: Skin turgor is normal. There are no pathologic skin lesions.  NEUROLOGIC:  Motor and sensation is grossly normal.  Cranial nerves  are grossly intact. PSYCH:  Alert and oriented to person, place and time. Affect is normal.  Data Reviewed I have personally reviewed the patient's imaging and medical records.    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 Evaluated and I do not think that an ileostomy would be his best interest. #1 controlling the ascites would be very challenging A vent abdominal operation is attempted. #2 and electrolyte abnormalities in the setting of an ileostomy would be very difficult to control. Obviously radical resection is not indicated given the extent of his disease. Discussed with the patient in detail about the circumstances and he does not want an ileostomy at this time. I do agree with aggressive chemotherapy and we will arrange for port placement next week by my partner Dr. Pat Patrick ( . Discussed with patient in detail about the procedure, risks benefits and possible complications including but not limited to: Bleeding, pneumothorax, vascular injury and re-interventions. I do think from the ascites perspective he is better managed with aggressive diuresis and decompressive paracentesis. We'll go ahead and order an ultrasound-guided paracentesis for next few days so we can control his abdominal symptoms. No need for immediate surgical relation.  Regarding more tissue for further analysis from an oncologic perspective may consider image guided biopsy of peritoneal implants. His is a difficult case and obviously he carries a very poor prognosis given the extent of his disease. Extensive counseling provided    Caroleen Hamman, MD [place] 03/20/2016, 2:00 PM

## 2016-03-25 NOTE — H&P (Signed)

## 2016-03-25 NOTE — Anesthesia Procedure Notes (Signed)
Date/Time: 03/25/2016 7:25 AM Performed by: Doreen Salvage Pre-anesthesia Checklist: Patient identified, Emergency Drugs available, Suction available and Patient being monitored Patient Re-evaluated:Patient Re-evaluated prior to inductionOxygen Delivery Method: Simple face mask Intubation Type: IV induction Dental Injury: Teeth and Oropharynx as per pre-operative assessment

## 2016-03-26 ENCOUNTER — Other Ambulatory Visit: Payer: Self-pay | Admitting: *Deleted

## 2016-03-26 ENCOUNTER — Inpatient Hospital Stay: Payer: Commercial Managed Care - PPO

## 2016-03-26 ENCOUNTER — Inpatient Hospital Stay (HOSPITAL_BASED_OUTPATIENT_CLINIC_OR_DEPARTMENT_OTHER): Payer: Commercial Managed Care - PPO | Admitting: Oncology

## 2016-03-26 VITALS — BP 145/97 | HR 83 | Temp 97.1°F | Resp 18 | Wt 236.8 lb

## 2016-03-26 DIAGNOSIS — C182 Malignant neoplasm of ascending colon: Secondary | ICD-10-CM

## 2016-03-26 DIAGNOSIS — Z87891 Personal history of nicotine dependence: Secondary | ICD-10-CM | POA: Diagnosis not present

## 2016-03-26 DIAGNOSIS — C801 Malignant (primary) neoplasm, unspecified: Secondary | ICD-10-CM

## 2016-03-26 DIAGNOSIS — R140 Abdominal distension (gaseous): Secondary | ICD-10-CM | POA: Diagnosis not present

## 2016-03-26 DIAGNOSIS — G4700 Insomnia, unspecified: Secondary | ICD-10-CM | POA: Diagnosis not present

## 2016-03-26 DIAGNOSIS — K5900 Constipation, unspecified: Secondary | ICD-10-CM | POA: Diagnosis not present

## 2016-03-26 DIAGNOSIS — R5383 Other fatigue: Secondary | ICD-10-CM

## 2016-03-26 DIAGNOSIS — R188 Other ascites: Secondary | ICD-10-CM | POA: Diagnosis not present

## 2016-03-26 DIAGNOSIS — C786 Secondary malignant neoplasm of retroperitoneum and peritoneum: Secondary | ICD-10-CM

## 2016-03-26 DIAGNOSIS — R109 Unspecified abdominal pain: Secondary | ICD-10-CM | POA: Diagnosis not present

## 2016-03-26 DIAGNOSIS — R590 Localized enlarged lymph nodes: Secondary | ICD-10-CM

## 2016-03-26 DIAGNOSIS — R531 Weakness: Secondary | ICD-10-CM

## 2016-03-26 DIAGNOSIS — Z5111 Encounter for antineoplastic chemotherapy: Secondary | ICD-10-CM | POA: Diagnosis not present

## 2016-03-26 DIAGNOSIS — C772 Secondary and unspecified malignant neoplasm of intra-abdominal lymph nodes: Secondary | ICD-10-CM

## 2016-03-26 DIAGNOSIS — Z79899 Other long term (current) drug therapy: Secondary | ICD-10-CM

## 2016-03-26 DIAGNOSIS — F419 Anxiety disorder, unspecified: Secondary | ICD-10-CM | POA: Diagnosis not present

## 2016-03-26 DIAGNOSIS — Z800 Family history of malignant neoplasm of digestive organs: Secondary | ICD-10-CM | POA: Diagnosis not present

## 2016-03-26 LAB — COMPREHENSIVE METABOLIC PANEL
ALBUMIN: 2.6 g/dL — AB (ref 3.5–5.0)
ALT: 7 U/L — ABNORMAL LOW (ref 17–63)
ANION GAP: 6 (ref 5–15)
AST: 13 U/L — ABNORMAL LOW (ref 15–41)
Alkaline Phosphatase: 110 U/L (ref 38–126)
BUN: 12 mg/dL (ref 6–20)
CALCIUM: 8 mg/dL — AB (ref 8.9–10.3)
CHLORIDE: 103 mmol/L (ref 101–111)
CO2: 26 mmol/L (ref 22–32)
Creatinine, Ser: 0.77 mg/dL (ref 0.61–1.24)
GFR calc non Af Amer: >60 mL/min (ref >60)
GLUCOSE: 103 mg/dL — AB (ref 65–99)
POTASSIUM: 4.1 mmol/L (ref 3.5–5.1)
SODIUM: 135 mmol/L (ref 135–145)
Total Bilirubin: 0.6 mg/dL (ref 0.3–1.2)
Total Protein: 6.5 g/dL (ref 6.5–8.1)

## 2016-03-26 LAB — CBC WITH DIFFERENTIAL/PLATELET
BASOS PCT: 1 %
Basophils Absolute: 0 10*3/uL (ref 0–0.1)
EOS ABS: 0.1 10*3/uL (ref 0–0.7)
EOS PCT: 2 %
HCT: 35 % — ABNORMAL LOW (ref 40.0–52.0)
Hemoglobin: 11.6 g/dL — ABNORMAL LOW (ref 13.0–18.0)
LYMPHS ABS: 1.7 10*3/uL (ref 1.0–3.6)
Lymphocytes Relative: 22 %
MCH: 24.7 pg — AB (ref 26.0–34.0)
MCHC: 33.2 g/dL (ref 32.0–36.0)
MCV: 74.5 fL — ABNORMAL LOW (ref 80.0–100.0)
MONOS PCT: 9 %
Monocytes Absolute: 0.7 10*3/uL (ref 0.2–1.0)
Neutro Abs: 5.2 10*3/uL (ref 1.4–6.5)
Neutrophils Relative %: 66 %
PLATELETS: 405 10*3/uL (ref 150–440)
RBC: 4.7 MIL/uL (ref 4.40–5.90)
RDW: 16.4 % — AB (ref 11.5–14.5)
WBC: 7.8 10*3/uL (ref 3.8–10.6)

## 2016-03-26 LAB — PROTEIN, URINE, RANDOM: Total Protein, Urine: 17 mg/dL

## 2016-03-26 MED ORDER — SODIUM CHLORIDE 0.9 % IV SOLN
10.0000 mg | Freq: Once | INTRAVENOUS | Status: AC
  Administered 2016-03-26: 10 mg via INTRAVENOUS
  Filled 2016-03-26: qty 1

## 2016-03-26 MED ORDER — LEUCOVORIN CALCIUM INJECTION 350 MG
900.0000 mg | Freq: Once | INTRAVENOUS | Status: AC
  Administered 2016-03-26: 900 mg via INTRAVENOUS
  Filled 2016-03-26: qty 35

## 2016-03-26 MED ORDER — SODIUM CHLORIDE 0.9 % IV SOLN
2400.0000 mg/m2 | INTRAVENOUS | Status: DC
  Administered 2016-03-26: 5500 mg via INTRAVENOUS
  Filled 2016-03-26: qty 110

## 2016-03-26 MED ORDER — OXALIPLATIN CHEMO INJECTION 100 MG/20ML
85.0000 mg/m2 | Freq: Once | INTRAVENOUS | Status: AC
  Administered 2016-03-26: 195 mg via INTRAVENOUS
  Filled 2016-03-26: qty 39

## 2016-03-26 MED ORDER — PALONOSETRON HCL INJECTION 0.25 MG/5ML
0.2500 mg | Freq: Once | INTRAVENOUS | Status: AC
  Administered 2016-03-26: 0.25 mg via INTRAVENOUS
  Filled 2016-03-26: qty 5

## 2016-03-26 MED ORDER — SODIUM CHLORIDE 0.9 % IV SOLN
5.0000 mg/kg | Freq: Once | INTRAVENOUS | Status: AC
  Administered 2016-03-26: 550 mg via INTRAVENOUS
  Filled 2016-03-26: qty 16

## 2016-03-26 MED ORDER — DEXTROSE 5 % IV SOLN
Freq: Once | INTRAVENOUS | Status: AC
  Administered 2016-03-26: 11:00:00 via INTRAVENOUS
  Filled 2016-03-26: qty 1000

## 2016-03-26 MED ORDER — FLUOROURACIL CHEMO INJECTION 2.5 GM/50ML
400.0000 mg/m2 | Freq: Once | INTRAVENOUS | Status: AC
  Administered 2016-03-26: 900 mg via INTRAVENOUS
  Filled 2016-03-26: qty 18

## 2016-03-26 MED ORDER — SODIUM CHLORIDE 0.9 % IV SOLN
Freq: Once | INTRAVENOUS | Status: AC
  Administered 2016-03-26: 10:00:00 via INTRAVENOUS
  Filled 2016-03-26: qty 1000

## 2016-03-26 NOTE — Progress Notes (Signed)
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   INTERVAL HISTORY: Patient returns to clinic today for further evaluation and consideration of cycle 1 of FOLFOX plus Avastin. He continues to have abdominal pain and bloating. He also complains of occasional constipation. He has some mild tenderness at his port site. He has no neurologic complaints. He denies any fevers or illnesses. He denies any chest pain or shortness of breath. He has a poor appetite, but denies weight loss. He denies any nausea, vomiting, or diarrhea. He has no urinary complaints. Patient offers no further specific complaints.  REVIEW OF SYSTEMS:   Review of Systems  Constitutional: Positive for malaise/fatigue. Negative for fever and weight loss.  Respiratory: Negative.  Negative for cough and shortness of breath.   Cardiovascular: Negative.  Negative for chest pain.  Gastrointestinal: Positive for nausea, abdominal pain and constipation. Negative for vomiting, diarrhea, blood in stool and melena.  Genitourinary: Negative.   Musculoskeletal: Negative.   Neurological: Positive for weakness.  Psychiatric/Behavioral: The patient is nervous/anxious. The patient does not have insomnia.     As per HPI. Otherwise, a complete review of systems is negatve.  PAST MEDICAL HISTORY: Past Medical History  Diagnosis Date  . Shortness of breath dyspnea     on exertion  . Loosening of tooth     top front  . Cancer [facility])     colon cancer  . Headache     H/O    PAST SURGICAL HISTORY: Past Surgical History  Procedure  Laterality Date  . Right foot surgery  2001  . Colonoscopy with propofol N/A 03/06/2016    Procedure: COLONOSCOPY WITH PROPOFOL;  Surgeon: Lucilla Lame, MD;  Location: [place];  Service: Endoscopy;  Laterality: N/A;  . Polypectomy  03/06/2016    Procedure: POLYPECTOMY;  Surgeon: Lucilla Lame, MD;  Location: [place];  Service: Endoscopy;;  . Portacath placement Right 03/25/2016    Procedure: INSERTION PORT-A-CATH;  Surgeon: [name], MD;  Location: [facility];  Service: General;  Laterality: Right;    FAMILY HISTORY Family History  Problem Relation Age of Onset  . Pancreatic cancer Maternal Grandfather   . Lymphoma Maternal Grandmother   . CAD Father   . Heart disease Father        ADVANCED DIRECTIVES:    HEALTH MAINTENANCE: Social History  Substance Use Topics  . Smoking status: Current Every Day Smoker -- 0.50 packs/day for 35 years    Types: Cigarettes  . Smokeless tobacco: Never Used     Comment: has more than 30 pack years history  . Alcohol Use: No     Colonoscopy:  PAP:  Bone density:  Lipid panel:  No Known Allergies  Current Outpatient Prescriptions  Medication Sig Dispense Refill  . furosemide (LASIX) 20 MG tablet Take 1 tablet (20 mg total) by mouth daily as needed for fluid or edema. (Patient taking differently: Take 20 mg by mouth daily as needed for fluid or edema. am) 30 tablet 0  . HYDROcodone-acetaminophen (NORCO/VICODIN) 5-325 MG tablet Take 1 tablet by mouth every 4 (four) hours as needed for moderate pain. 60 tablet 0  . lidocaine-prilocaine (  EMLA) cream Apply to affected area once 30 g 3  . polyethylene glycol (MIRALAX / GLYCOLAX) packet Take 17 g by mouth daily.     No current facility-administered medications for this visit.    OBJECTIVE: Filed Vitals:   03/26/16 0922  BP: 145/97  Pulse: 83  Temp: 97.1 F (36.2 C)  Resp: 18     Body mass index is 33.97 kg/(m^2).    ECOG FS:1 - Symptomatic but completely  ambulatory  General: Well-developed, well-nourished, no acute distress. Eyes: Pink conjunctiva, anicteric sclera. Lungs: Clear to auscultation bilaterally. Heart: Regular rate and rhythm. No rubs, murmurs, or gallops. Abdomen: Mildly distended, nontender. Musculoskeletal: No edema, cyanosis, or clubbing. Neuro: Alert, answering all questions appropriately. Cranial nerves grossly intact. Skin: No rashes or petechiae noted. Psych: Normal affect.    LAB RESULTS:  Lab Results  Component Value Date   NA 135 03/26/2016   K 4.1 03/26/2016   CL 103 03/26/2016   CO2 26 03/26/2016   GLUCOSE 103* 03/26/2016   BUN 12 03/26/2016   CREATININE 0.77 03/26/2016   CALCIUM 8.0* 03/26/2016   PROT 6.5 03/26/2016   ALBUMIN 2.6* 03/26/2016   AST 13* 03/26/2016   ALT 7* 03/26/2016   ALKPHOS 110 03/26/2016   BILITOT 0.6 03/26/2016   GFRNONAA >60 03/26/2016   GFRAA >60 03/26/2016    Lab Results  Component Value Date   WBC 7.8 03/26/2016   NEUTROABS 5.2 03/26/2016   HGB 11.6* 03/26/2016   HCT 35.0* 03/26/2016   MCV 74.5* 03/26/2016   PLT 405 03/26/2016   Lab Results  Component Value Date   CEA 3.1 02/18/2016     STUDIES: Dg Chest 1 View  03/25/2016  CLINICAL DATA:  Patient is post port-a-cath placement. EXAM: CHEST 1 VIEW COMPARISON:  None FINDINGS: Cardiac silhouette is normal in size and configuration. No convincing mediastinal or hilar masses. Lung base opacities noted, greater on the left, likely atelectasis. Pneumonia is possible this correlates clinically. Remainder of the lungs is clear. No convincing pleural effusion and no pneumothorax. Right anterior chest wall, internal jugular, Port-A-Cath has its tip in the mid to upper superior vena cava. IMPRESSION: 1. Left greater than right lung base opacity, likely atelectasis. Consider pneumonia in the proper clinical setting. 2. Port-A-Cath tip projects in the mid to upper superior vena cava. No pneumothorax. Electronically Signed   By:  Lajean Manes M.D.   On: 03/25/2016 08:45   Nm Pet Image Initial (pi) Skull Base To Thigh  03/04/2016  CLINICAL DATA:  Initial treatment strategy for peritoneal carcinomatosis. EXAM: NUCLEAR MEDICINE PET SKULL BASE TO THIGH TECHNIQUE: Thirteen mCi F-18 FDG was injected intravenously. Full-ring PET imaging was performed from the skull base to thigh after the radiotracer. CT data was obtained and used for attenuation correction and anatomic localization. FASTING BLOOD GLUCOSE:  Value: 96 mg/dl COMPARISON:  None. FINDINGS: NECK There is a left level 2 node which measures 1 cm, image 38 of series 3. SUV max associated with this node is equal to 3.1. CHEST No mediastinal or hilar adenopathy. Mild asymmetric increased uptake within the left hilar region has an SUV max equal to 3.0. No pulmonary nodules identified. No axillary or supraclavicular adenopathy noted. ABDOMEN/PELVIS No abnormal hypermetabolic activity within the liver, pancreas, adrenal glands, or spleen. Moderate volume of ascites identified within the abdomen and pelvis. Extensive peritoneal carcinomatosis is identified. Index lesion within the right upper quadrant of the abdomen measures 3.5 x 2.0 cm, image 191 of series 3.  SUV max equals 8.27. Peritoneal lesion adjacent to the inferior right lobe of lesion measures 2.7 x 1.7 cm, image 176 of series 3. SUV max equals 9.1. Peritoneal lesion within the left lower quadrant of the abdomen measures 2.4 x 1.5 cm and has an SUV max equal to 6.09. Diffuse mesenteric adenopathy is intensely hypermetabolic. Index jejunal mesenteric node measures 2 x 1.7 cm, image 1 193 of series 3. SUV max equals 7.5. Lastly from the gastrohepatic ligament region nodules/lymph node measures 1.7 x 1.5 cm and has an SUV max equal to 5.7. No pathologic dilatation of the small or large bowel loops identified. SKELETON No focal hypermetabolic activity to suggest skeletal metastasis. IMPRESSION: 1. Evidence of extensive hypermetabolic  peritoneal carcinomatosis as above. 2. There is equivocal uptake associated with borderline enlarged left level 2 lymph node and left hilar lymph node. SUV max equal to 3.0. Electronically Signed   By: Kerby Moors M.D.   On: 03/04/2016 13:30   US Paracentesis  03/21/2016  INDICATION: Colon cancer and recurrent ascites. EXAM: ULTRASOUND GUIDED THERAPEUTIC PARACENTESIS MEDICATIONS: None. COMPLICATIONS: None immediate. PROCEDURE: Informed written consent was obtained from the patient after a discussion of the risks, benefits and alternatives to treatment. A timeout was performed prior to the initiation of the procedure. Initial ultrasound scanning demonstrates a large amount of ascites within the right lower abdominal quadrant. The right lower abdomen was prepped and draped in the usual sterile fashion. 1% lidocaine was used for local anesthesia. Following this, a 6 Fr Safe-T-Centesis catheter was introduced. An ultrasound image was saved for documentation purposes. The paracentesis was performed. The catheter was removed and a dressing was applied. The patient tolerated the procedure well without immediate post procedural complication. FINDINGS: A total of approximately 5 L of yellow fluid was removed. IMPRESSION: Successful ultrasound-guided paracentesis yielding 5 liters of peritoneal fluid. Electronically Signed   By: Markus Daft M.D.   On: 03/21/2016 10:18   US Paracentesis  02/27/2016  INDICATION: Ascites and peritoneal carcinomatosis EXAM: ULTRASOUND GUIDED PARACENTESIS MEDICATIONS: None. ANESTHESIA/SEDATION: None COMPLICATIONS: None immediate. PROCEDURE: Informed written consent was obtained from the patient after a discussion of the risks, benefits and alternatives to treatment. A timeout was performed prior to the initiation of the procedure. Initial ultrasound scanning demonstrates a large amount of ascites within the right lower abdominal quadrant. The right lower abdomen was prepped and draped in  the usual sterile fashion. 1% lidocaine with epinephrine was used for local anesthesia. Following this, a 6 French Safe-T-Centesis catheter was introduced. An ultrasound image was saved for documentation purposes. The paracentesis was performed. The catheter was removed and a dressing was applied. The patient tolerated the procedure well without immediate post procedural complication. FINDINGS: A total of approximately 5.5 L of clear yellow fluid was removed. Samples were sent to the laboratory as requested by the clinical team. IMPRESSION: Successful ultrasound-guided paracentesis yielding 5.5 liters of peritoneal fluid. Electronically Signed   By: Inez Catalina M.D.   On: 02/27/2016 14:26   Dg C-arm 1-60 Min-no Report  03/25/2016  CLINICAL DATA: port a cath C-ARM 1-60 MINUTES Fluoroscopy was utilized by the requesting physician.  No radiographic interpretation.    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  PLAN:    1. 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 Proceed with cycle  1 of FOLFOX plus Avastin today. Plan to do a total of 12 cycles with reimaging after 6. CEA and CA-19-9 are within normal limits. Return to clinic in 2 days for pump removal, 1 week for laboratory work, and 2 weeks for consideration of cycle 2. 2. Pain: Patient was given a prescription for oxycodone today. 3. Constipation: Continue stool softeners and patient was instructed to use OTC MiraLAX. 4. Difficulty sleeping: Continue 5 mg Ambien as needed.   Patient expressed understanding and was in agreement with this plan. He also understands that He can call clinic at any time with any questions, concerns, or complaints.   Lloyd Huger, MD   03/26/2016 9:34 AM

## 2016-03-26 NOTE — Progress Notes (Signed)
States is having soreness around port site.

## 2016-03-26 NOTE — Progress Notes (Unsigned)
PSN met with patient's wife today regarding financial concerns.  PSN advised wife about Social Security Disability, food stamps.  Patient will be assisted with resources through the [place] and [employer].

## 2016-03-28 ENCOUNTER — Inpatient Hospital Stay: Payer: Commercial Managed Care - PPO

## 2016-03-28 VITALS — BP 132/84 | HR 84 | Temp 97.2°F | Resp 18

## 2016-03-28 DIAGNOSIS — C182 Malignant neoplasm of ascending colon: Secondary | ICD-10-CM | POA: Diagnosis not present

## 2016-03-28 MED ORDER — HEPARIN SOD (PORK) LOCK FLUSH 100 UNIT/ML IV SOLN
500.0000 [IU] | Freq: Once | INTRAVENOUS | Status: AC | PRN
  Administered 2016-03-28: 500 [IU]

## 2016-03-28 MED ORDER — SODIUM CHLORIDE 0.9% FLUSH
10.0000 mL | INTRAVENOUS | Status: DC | PRN
  Administered 2016-03-28: 10 mL
  Filled 2016-03-28: qty 10

## 2016-03-28 MED ORDER — HEPARIN SOD (PORK) LOCK FLUSH 100 UNIT/ML IV SOLN
INTRAVENOUS | Status: AC
  Filled 2016-03-28: qty 5

## 2016-03-31 ENCOUNTER — Other Ambulatory Visit: Payer: Self-pay | Admitting: *Deleted

## 2016-03-31 DIAGNOSIS — C182 Malignant neoplasm of ascending colon: Secondary | ICD-10-CM

## 2016-03-31 DIAGNOSIS — C772 Secondary and unspecified malignant neoplasm of intra-abdominal lymph nodes: Principal | ICD-10-CM

## 2016-03-31 MED ORDER — OXYCODONE-ACETAMINOPHEN 5-325 MG PO TABS: 1.0000 | ORAL_TABLET | ORAL | Status: DC | PRN

## 2016-03-31 NOTE — Anesthesia Postprocedure Evaluation (Signed)
Anesthesia Post Note  Patient: Andrew Ibarra  Procedure(s) Performed: Procedure(s) (LRB): INSERTION PORT-A-CATH (Right)  Patient location during evaluation: PACU Anesthesia Type: General Level of consciousness: awake and alert Pain management: pain level controlled Vital Signs Assessment: post-procedure vital signs reviewed and stable Respiratory status: spontaneous breathing, nonlabored ventilation, respiratory function stable and patient connected to nasal cannula oxygen Cardiovascular status: blood pressure returned to baseline and stable Postop Assessment: no signs of nausea or vomiting Anesthetic complications: no    Last Vitals:  Filed Vitals:   03/25/16 0859 03/25/16 0925  BP: 123/80 127/83  Pulse: 78 76  Temp: 36.1 C   Resp: 16 16    Last Pain:  Filed Vitals:   03/25/16 0944  PainSc: 0-No pain                 Molli Barrows

## 2016-04-02 ENCOUNTER — Inpatient Hospital Stay: Payer: Commercial Managed Care - PPO

## 2016-04-02 ENCOUNTER — Other Ambulatory Visit: Payer: Commercial Managed Care - PPO

## 2016-04-02 DIAGNOSIS — C182 Malignant neoplasm of ascending colon: Secondary | ICD-10-CM

## 2016-04-02 LAB — COMPREHENSIVE METABOLIC PANEL
ALBUMIN: 3.1 g/dL — AB (ref 3.5–5.0)
ALK PHOS: 118 U/L (ref 38–126)
ALT: 12 U/L — ABNORMAL LOW (ref 17–63)
AST: 15 U/L (ref 15–41)
Anion gap: 8 (ref 5–15)
BILIRUBIN TOTAL: 0.5 mg/dL (ref 0.3–1.2)
BUN: 12 mg/dL (ref 6–20)
CALCIUM: 8 mg/dL — AB (ref 8.9–10.3)
CO2: 24 mmol/L (ref 22–32)
CREATININE: 0.81 mg/dL (ref 0.61–1.24)
Chloride: 101 mmol/L (ref 101–111)
GFR calc Af Amer: >60 mL/min (ref >60)
GFR calc non Af Amer: >60 mL/min (ref >60)
GLUCOSE: 98 mg/dL (ref 65–99)
POTASSIUM: 4.4 mmol/L (ref 3.5–5.1)
Sodium: 133 mmol/L — ABNORMAL LOW (ref 135–145)
Total Protein: 7.2 g/dL (ref 6.5–8.1)

## 2016-04-02 LAB — CBC WITH DIFFERENTIAL/PLATELET
BASOS ABS: 0 10*3/uL (ref 0–0.1)
BASOS PCT: 0 %
Eosinophils Absolute: 0.1 10*3/uL (ref 0–0.7)
Eosinophils Relative: 2 %
HEMATOCRIT: 34.6 % — AB (ref 40.0–52.0)
HEMOGLOBIN: 11.5 g/dL — AB (ref 13.0–18.0)
Lymphocytes Relative: 26 %
Lymphs Abs: 1.7 10*3/uL (ref 1.0–3.6)
MCH: 24.7 pg — ABNORMAL LOW (ref 26.0–34.0)
MCHC: 33.1 g/dL (ref 32.0–36.0)
MCV: 74.6 fL — ABNORMAL LOW (ref 80.0–100.0)
Monocytes Absolute: 0.5 10*3/uL (ref 0.2–1.0)
Monocytes Relative: 7 %
NEUTROS ABS: 4.3 10*3/uL (ref 1.4–6.5)
NEUTROS PCT: 65 %
Platelets: 272 10*3/uL (ref 150–440)
RBC: 4.64 MIL/uL (ref 4.40–5.90)
RDW: 16.2 % — AB (ref 11.5–14.5)
WBC: 6.6 10*3/uL (ref 3.8–10.6)

## 2016-04-09 ENCOUNTER — Inpatient Hospital Stay: Payer: Commercial Managed Care - PPO

## 2016-04-09 ENCOUNTER — Inpatient Hospital Stay (HOSPITAL_BASED_OUTPATIENT_CLINIC_OR_DEPARTMENT_OTHER): Payer: Commercial Managed Care - PPO | Admitting: Oncology

## 2016-04-09 VITALS — BP 123/86 | HR 72

## 2016-04-09 VITALS — BP 134/84 | HR 89 | Temp 96.5°F | Resp 18 | Wt 227.4 lb

## 2016-04-09 DIAGNOSIS — R109 Unspecified abdominal pain: Secondary | ICD-10-CM

## 2016-04-09 DIAGNOSIS — F419 Anxiety disorder, unspecified: Secondary | ICD-10-CM

## 2016-04-09 DIAGNOSIS — K5900 Constipation, unspecified: Secondary | ICD-10-CM

## 2016-04-09 DIAGNOSIS — C786 Secondary malignant neoplasm of retroperitoneum and peritoneum: Secondary | ICD-10-CM

## 2016-04-09 DIAGNOSIS — G4700 Insomnia, unspecified: Secondary | ICD-10-CM | POA: Diagnosis not present

## 2016-04-09 DIAGNOSIS — C772 Secondary and unspecified malignant neoplasm of intra-abdominal lymph nodes: Principal | ICD-10-CM

## 2016-04-09 DIAGNOSIS — R590 Localized enlarged lymph nodes: Secondary | ICD-10-CM

## 2016-04-09 DIAGNOSIS — R5383 Other fatigue: Secondary | ICD-10-CM

## 2016-04-09 DIAGNOSIS — R531 Weakness: Secondary | ICD-10-CM

## 2016-04-09 DIAGNOSIS — C801 Malignant (primary) neoplasm, unspecified: Secondary | ICD-10-CM

## 2016-04-09 DIAGNOSIS — C182 Malignant neoplasm of ascending colon: Secondary | ICD-10-CM

## 2016-04-09 DIAGNOSIS — R188 Other ascites: Secondary | ICD-10-CM

## 2016-04-09 DIAGNOSIS — R140 Abdominal distension (gaseous): Secondary | ICD-10-CM

## 2016-04-09 LAB — COMPREHENSIVE METABOLIC PANEL
ALT: 8 U/L — AB (ref 17–63)
AST: 12 U/L — AB (ref 15–41)
Albumin: 3 g/dL — ABNORMAL LOW (ref 3.5–5.0)
Alkaline Phosphatase: 111 U/L (ref 38–126)
Anion gap: 7 (ref 5–15)
BUN: 9 mg/dL (ref 6–20)
CHLORIDE: 101 mmol/L (ref 101–111)
CO2: 24 mmol/L (ref 22–32)
CREATININE: 0.87 mg/dL (ref 0.61–1.24)
Calcium: 8 mg/dL — ABNORMAL LOW (ref 8.9–10.3)
GFR calc Af Amer: >60 mL/min (ref >60)
GFR calc non Af Amer: >60 mL/min (ref >60)
Glucose, Bld: 99 mg/dL (ref 65–99)
Potassium: 4.2 mmol/L (ref 3.5–5.1)
SODIUM: 132 mmol/L — AB (ref 135–145)
Total Bilirubin: 0.7 mg/dL (ref 0.3–1.2)
Total Protein: 6.9 g/dL (ref 6.5–8.1)

## 2016-04-09 LAB — CBC WITH DIFFERENTIAL/PLATELET
BASOS ABS: 0 10*3/uL (ref 0–0.1)
Basophils Relative: 1 %
EOS ABS: 0.1 10*3/uL (ref 0–0.7)
EOS PCT: 3 %
HCT: 32.6 % — ABNORMAL LOW (ref 40.0–52.0)
Hemoglobin: 10.7 g/dL — ABNORMAL LOW (ref 13.0–18.0)
LYMPHS PCT: 35 %
Lymphs Abs: 1.5 10*3/uL (ref 1.0–3.6)
MCH: 24.6 pg — ABNORMAL LOW (ref 26.0–34.0)
MCHC: 32.9 g/dL (ref 32.0–36.0)
MCV: 74.7 fL — ABNORMAL LOW (ref 80.0–100.0)
Monocytes Absolute: 0.5 10*3/uL (ref 0.2–1.0)
Monocytes Relative: 13 %
Neutro Abs: 2.1 10*3/uL (ref 1.4–6.5)
Neutrophils Relative %: 48 %
PLATELETS: 272 10*3/uL (ref 150–440)
RBC: 4.36 MIL/uL — AB (ref 4.40–5.90)
RDW: 17.4 % — ABNORMAL HIGH (ref 11.5–14.5)
WBC: 4.2 10*3/uL (ref 3.8–10.6)

## 2016-04-09 MED ORDER — LEUCOVORIN CALCIUM INJECTION 350 MG
900.0000 mg | Freq: Once | INTRAVENOUS | Status: AC
  Administered 2016-04-09: 900 mg via INTRAVENOUS
  Filled 2016-04-09: qty 25

## 2016-04-09 MED ORDER — DEXTROSE 5 % IV SOLN
Freq: Once | INTRAVENOUS | Status: AC
  Administered 2016-04-09: 12:00:00 via INTRAVENOUS
  Filled 2016-04-09: qty 1000

## 2016-04-09 MED ORDER — OXYCODONE-ACETAMINOPHEN 5-325 MG PO TABS: 1.0000 | ORAL_TABLET | ORAL | 0 refills | Status: DC | PRN

## 2016-04-09 MED ORDER — SODIUM CHLORIDE 0.9% FLUSH
10.0000 mL | INTRAVENOUS | Status: AC | PRN
  Administered 2016-04-09: 10 mL via INTRAVENOUS
  Filled 2016-04-09: qty 10

## 2016-04-09 MED ORDER — PALONOSETRON HCL INJECTION 0.25 MG/5ML
0.2500 mg | Freq: Once | INTRAVENOUS | Status: AC
  Administered 2016-04-09: 0.25 mg via INTRAVENOUS
  Filled 2016-04-09: qty 5

## 2016-04-09 MED ORDER — SODIUM CHLORIDE 0.9% FLUSH
10.0000 mL | INTRAVENOUS | Status: DC | PRN
  Administered 2016-04-09: 10 mL via INTRAVENOUS
  Filled 2016-04-09: qty 10

## 2016-04-09 MED ORDER — OXALIPLATIN CHEMO INJECTION 100 MG/20ML
85.0000 mg/m2 | Freq: Once | INTRAVENOUS | Status: AC
  Administered 2016-04-09: 195 mg via INTRAVENOUS
  Filled 2016-04-09: qty 39

## 2016-04-09 MED ORDER — SODIUM CHLORIDE 0.9 % IV SOLN
Freq: Once | INTRAVENOUS | Status: AC
  Administered 2016-04-09: 11:00:00 via INTRAVENOUS
  Filled 2016-04-09: qty 1000

## 2016-04-09 MED ORDER — SODIUM CHLORIDE 0.9 % IV SOLN
10.0000 mg | Freq: Once | INTRAVENOUS | Status: AC
  Administered 2016-04-09: 10 mg via INTRAVENOUS
  Filled 2016-04-09: qty 1

## 2016-04-09 MED ORDER — SODIUM CHLORIDE 0.9 % IV SOLN
2400.0000 mg/m2 | INTRAVENOUS | Status: DC
  Administered 2016-04-09: 5500 mg via INTRAVENOUS
  Filled 2016-04-09: qty 100

## 2016-04-09 MED ORDER — FLUOROURACIL CHEMO INJECTION 2.5 GM/50ML
400.0000 mg/m2 | Freq: Once | INTRAVENOUS | Status: AC
  Administered 2016-04-09: 900 mg via INTRAVENOUS
  Filled 2016-04-09: qty 18

## 2016-04-09 MED ORDER — SODIUM CHLORIDE 0.9 % IV SOLN
5.0000 mg/kg | Freq: Once | INTRAVENOUS | Status: AC
  Administered 2016-04-09: 550 mg via INTRAVENOUS
  Filled 2016-04-09: qty 16

## 2016-04-09 NOTE — Progress Notes (Signed)
Patient came to clinic with stiches still in place from port placement.  Contacted Dr. Pat Patrick and he stated that patient was scheduled to be seen in office last week.  Per Dr. Pat Patrick stiches were removed

## 2016-04-09 NOTE — Progress Notes (Signed)
States had severe abdominal pain the Monday following last treatment but pain is better today. Having difficulty sleeping at night. Decreased energy levels. Pt does request refill of percocet, states only has 5 tablets left.

## 2016-04-09 NOTE — Progress Notes (Signed)
[place]  Telephone:(336) [phone] Fax:(336) [phone]  ID: Andrew Ibarra OB: 1965/10/18  MR#: [id number]  FHL#:[id number]  Patient Care Team: Gunnar Bulla as PCP - General (Physician Assistant) Clent Jacks, RN as Registered Nurse Clayburn Pert, MD as Consulting Physician (General Surgery)  CHIEF COMPLAINT: Stage IVb adenocarcinoma of the ascending colon with omental caking and peritoneal carcinomatosis.   INTERVAL HISTORY: Patient returns to clinic today for further evaluation and consideration of cycle 2 of FOLFOX plus Avastin. He tolerated his first treatment well without significant side effects. He continues to have mild abdominal pain and bloating. He also complains of occasional constipation. He has increased weakness and fatigue. He has no neurologic complaints. He denies any fevers or illnesses. He denies any chest pain or shortness of breath. He has a poor appetite, but denies weight loss. He denies any nausea, vomiting, or diarrhea. He has no urinary complaints. Patient offers no further specific complaints.  REVIEW OF SYSTEMS:   Review of Systems  Constitutional: Positive for malaise/fatigue. Negative for fever and weight loss.  Respiratory: Negative.  Negative for cough and shortness of breath.   Cardiovascular: Negative.  Negative for chest pain.  Gastrointestinal: Positive for abdominal pain, constipation and nausea. Negative for blood in stool, diarrhea, melena and vomiting.  Genitourinary: Negative.   Musculoskeletal: Negative.   Neurological: Positive for weakness.  Psychiatric/Behavioral: The patient is nervous/anxious. The patient does not have insomnia.     As per HPI. Otherwise, a complete review of systems is negatve.  PAST MEDICAL HISTORY: Past Medical History:  Diagnosis Date  . Cancer [facility])    colon cancer  . Headache    H/O  . Loosening of tooth    top front  . Shortness of breath dyspnea    on exertion     PAST SURGICAL HISTORY: Past Surgical History:  Procedure Laterality Date  . COLONOSCOPY WITH PROPOFOL N/A 03/06/2016   Procedure: COLONOSCOPY WITH PROPOFOL;  Surgeon: Lucilla Lame, MD;  Location: [place];  Service: Endoscopy;  Laterality: N/A;  . POLYPECTOMY  03/06/2016   Procedure: POLYPECTOMY;  Surgeon: Lucilla Lame, MD;  Location: [place];  Service: Endoscopy;;  . PORTACATH PLACEMENT Right 03/25/2016   Procedure: INSERTION PORT-A-CATH;  Surgeon: [name], MD;  Location: [facility];  Service: General;  Laterality: Right;  . Right foot surgery  2001    FAMILY HISTORY Family History  Problem Relation Age of Onset  . Pancreatic cancer Maternal Grandfather   . Lymphoma Maternal Grandmother   . CAD Father   . Heart disease Father        ADVANCED DIRECTIVES:    HEALTH MAINTENANCE: Social History  Substance Use Topics  . Smoking status: Current Every Day Smoker    Packs/day: 0.50    Years: 35.00    Types: Cigarettes  . Smokeless tobacco: Never Used     Comment: has more than 30 pack years history  . Alcohol use No     Colonoscopy:  PAP:  Bone density:  Lipid panel:  No Known Allergies  Current Outpatient Prescriptions  Medication Sig Dispense Refill  . furosemide (LASIX) 20 MG tablet Take 1 tablet (20 mg total) by mouth daily as needed for fluid or edema. (Patient taking differently: Take 20 mg by mouth daily as needed for fluid or edema. am) 30 tablet 0  . lidocaine-prilocaine (EMLA) cream Apply to affected area once 30 g 3  . oxyCODONE-acetaminophen (PERCOCET/ROXICET) 5-325 MG tablet Take 1  tablet by mouth every 4 (four) hours as needed for severe pain. 60 tablet 0  . polyethylene glycol (MIRALAX / GLYCOLAX) packet Take 17 g by mouth daily.     No current facility-administered medications for this visit.    Facility-Administered Medications Ordered in Other Visits  Medication Dose Route Frequency Provider Last Rate Last Dose  . sodium  chloride flush (NS) 0.9 % injection 10 mL  10 mL Intravenous PRN Lloyd Huger, MD   10 mL at 04/09/16 1014    OBJECTIVE: Vitals:   04/09/16 1025  BP: 134/84  Pulse: 89  Resp: 18  Temp: (!) 96.5 F (35.8 C)     Body mass index is 32.63 kg/m.    ECOG FS:1 - Symptomatic but completely ambulatory  General: Well-developed, well-nourished, no acute distress. Eyes: Pink conjunctiva, anicteric sclera. Lungs: Clear to auscultation bilaterally. Heart: Regular rate and rhythm. No rubs, murmurs, or gallops. Abdomen: Mildly distended, nontender. Musculoskeletal: No edema, cyanosis, or clubbing. Neuro: Alert, answering all questions appropriately. Cranial nerves grossly intact. Skin: No rashes or petechiae noted. Psych: Normal affect.    LAB RESULTS:  Lab Results  Component Value Date   NA 132 (L) 04/09/2016   K 4.2 04/09/2016   CL 101 04/09/2016   CO2 24 04/09/2016   GLUCOSE 99 04/09/2016   BUN 9 04/09/2016   CREATININE 0.87 04/09/2016   CALCIUM 8.0 (L) 04/09/2016   PROT 6.9 04/09/2016   ALBUMIN 3.0 (L) 04/09/2016   AST 12 (L) 04/09/2016   ALT 8 (L) 04/09/2016   ALKPHOS 111 04/09/2016   BILITOT 0.7 04/09/2016   GFRNONAA >60 04/09/2016   GFRAA >60 04/09/2016    Lab Results  Component Value Date   WBC 4.2 04/09/2016   NEUTROABS 2.1 04/09/2016   HGB 10.7 (L) 04/09/2016   HCT 32.6 (L) 04/09/2016   MCV 74.7 (L) 04/09/2016   PLT 272 04/09/2016   Lab Results  Component Value Date   CEA 3.1 02/18/2016     STUDIES: Dg Chest 1 View  Result Date: 03/25/2016 CLINICAL DATA:  Patient is post port-a-cath placement. EXAM: CHEST 1 VIEW COMPARISON:  None FINDINGS: Cardiac silhouette is normal in size and configuration. No convincing mediastinal or hilar masses. Lung base opacities noted, greater on the left, likely atelectasis. Pneumonia is possible this correlates clinically. Remainder of the lungs is clear. No convincing pleural effusion and no pneumothorax. Right anterior  chest wall, internal jugular, Port-A-Cath has its tip in the mid to upper superior vena cava. IMPRESSION: 1. Left greater than right lung base opacity, likely atelectasis. Consider pneumonia in the proper clinical setting. 2. Port-A-Cath tip projects in the mid to upper superior vena cava. No pneumothorax. Electronically Signed   By: Lajean Manes M.D.   On: 03/25/2016 08:45   US Paracentesis  Result Date: 03/21/2016 INDICATION: Colon cancer and recurrent ascites. EXAM: ULTRASOUND GUIDED THERAPEUTIC PARACENTESIS MEDICATIONS: None. COMPLICATIONS: None immediate. PROCEDURE: Informed written consent was obtained from the patient after a discussion of the risks, benefits and alternatives to treatment. A timeout was performed prior to the initiation of the procedure. Initial ultrasound scanning demonstrates a large amount of ascites within the right lower abdominal quadrant. The right lower abdomen was prepped and draped in the usual sterile fashion. 1% lidocaine was used for local anesthesia. Following this, a 6 Fr Safe-T-Centesis catheter was introduced. An ultrasound image was saved for documentation purposes. The paracentesis was performed. The catheter was removed and a dressing was applied. The patient tolerated  the procedure well without immediate post procedural complication. FINDINGS: A total of approximately 5 L of yellow fluid was removed. IMPRESSION: Successful ultrasound-guided paracentesis yielding 5 liters of peritoneal fluid. Electronically Signed   By: Markus Daft M.D.   On: 03/21/2016 10:18   Dg C-arm 1-60 Min-no Report  Result Date: 03/25/2016 CLINICAL DATA: port a cath C-ARM 1-60 MINUTES Fluoroscopy was utilized by the requesting physician.  No radiographic interpretation.    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  PLAN:    1. Stage IVb adenocarcinoma of the ascending colon with omental caking and peritoneal  carcinomatosis: Patient's biopsy was small and likely will not be able to do additional testing such as extended K-ras. Proceed with cycle 2 of FOLFOX plus Avastin today. Plan to do a total of 12 cycles with reimaging after 6. CEA and CA-19-9 are within normal limits. Return to clinic in 2 days for pump removal, 2 weeks for consideration of cycle 3 and then in 4 weeks for further evaluation and consideration of cycle 4. Of note, patient became irritable and had difficulty sleeping secondary to dexamethasone premedication, therefore this was discontinued from his treatment regimen. 2. Pain: Continue oxycodone as needed. 3. Constipation: Continue stool softeners and patient was instructed to use OTC MiraLAX. 4. Difficulty sleeping: Discontinue dexamethasone as above. Continue 5 mg Ambien as needed.   Patient expressed understanding and was in agreement with this plan. He also understands that He can call clinic at any time with any questions, concerns, or complaints.   Lloyd Huger, MD   04/12/2016 9:52 AM

## 2016-04-11 ENCOUNTER — Other Ambulatory Visit: Payer: Self-pay | Admitting: Oncology

## 2016-04-11 ENCOUNTER — Inpatient Hospital Stay: Payer: Commercial Managed Care - PPO

## 2016-04-11 DIAGNOSIS — C182 Malignant neoplasm of ascending colon: Secondary | ICD-10-CM

## 2016-04-11 MED ORDER — SODIUM CHLORIDE 0.9% FLUSH
10.0000 mL | INTRAVENOUS | Status: DC | PRN
  Administered 2016-04-11: 10 mL
  Filled 2016-04-11: qty 10

## 2016-04-11 MED ORDER — HEPARIN SOD (PORK) LOCK FLUSH 100 UNIT/ML IV SOLN
INTRAVENOUS | Status: AC
  Filled 2016-04-11: qty 5

## 2016-04-11 MED ORDER — HEPARIN SOD (PORK) LOCK FLUSH 100 UNIT/ML IV SOLN
500.0000 [IU] | Freq: Once | INTRAVENOUS | Status: AC | PRN
  Administered 2016-04-11: 500 [IU]

## 2016-04-17 ENCOUNTER — Other Ambulatory Visit: Payer: Self-pay | Admitting: *Deleted

## 2016-04-17 DIAGNOSIS — C772 Secondary and unspecified malignant neoplasm of intra-abdominal lymph nodes: Principal | ICD-10-CM

## 2016-04-17 DIAGNOSIS — C182 Malignant neoplasm of ascending colon: Secondary | ICD-10-CM

## 2016-04-17 MED ORDER — OXYCODONE-ACETAMINOPHEN 5-325 MG PO TABS: 1.0000 | ORAL_TABLET | ORAL | 0 refills | Status: DC | PRN

## 2016-04-23 ENCOUNTER — Ambulatory Visit
Admission: RE | Admit: 2016-04-23 | Discharge: 2016-04-23 | Disposition: A | Discharge status: Home / Self Care | Payer: Commercial Managed Care - PPO | Source: Ambulatory Visit | Attending: Internal Medicine | Admitting: Internal Medicine

## 2016-04-23 ENCOUNTER — Inpatient Hospital Stay: Payer: Commercial Managed Care - PPO | Attending: Oncology

## 2016-04-23 ENCOUNTER — Other Ambulatory Visit: Payer: Self-pay | Admitting: Internal Medicine

## 2016-04-23 ENCOUNTER — Inpatient Hospital Stay: Payer: Commercial Managed Care - PPO

## 2016-04-23 ENCOUNTER — Telehealth: Payer: Self-pay | Admitting: Internal Medicine

## 2016-04-23 VITALS — BP 139/90 | HR 92 | Temp 96.6°F | Resp 20

## 2016-04-23 DIAGNOSIS — G893 Neoplasm related pain (acute) (chronic): Secondary | ICD-10-CM | POA: Insufficient documentation

## 2016-04-23 DIAGNOSIS — C182 Malignant neoplasm of ascending colon: Secondary | ICD-10-CM | POA: Diagnosis not present

## 2016-04-23 DIAGNOSIS — F17210 Nicotine dependence, cigarettes, uncomplicated: Secondary | ICD-10-CM | POA: Diagnosis not present

## 2016-04-23 DIAGNOSIS — Z79899 Other long term (current) drug therapy: Secondary | ICD-10-CM | POA: Diagnosis not present

## 2016-04-23 DIAGNOSIS — R197 Diarrhea, unspecified: Secondary | ICD-10-CM | POA: Insufficient documentation

## 2016-04-23 DIAGNOSIS — F419 Anxiety disorder, unspecified: Secondary | ICD-10-CM | POA: Diagnosis not present

## 2016-04-23 DIAGNOSIS — R935 Abnormal findings on diagnostic imaging of other abdominal regions, including retroperitoneum: Secondary | ICD-10-CM | POA: Diagnosis not present

## 2016-04-23 DIAGNOSIS — R630 Anorexia: Secondary | ICD-10-CM | POA: Insufficient documentation

## 2016-04-23 DIAGNOSIS — G4700 Insomnia, unspecified: Secondary | ICD-10-CM | POA: Diagnosis not present

## 2016-04-23 DIAGNOSIS — Z5111 Encounter for antineoplastic chemotherapy: Secondary | ICD-10-CM | POA: Diagnosis not present

## 2016-04-23 DIAGNOSIS — R5383 Other fatigue: Secondary | ICD-10-CM | POA: Insufficient documentation

## 2016-04-23 DIAGNOSIS — C786 Secondary malignant neoplasm of retroperitoneum and peritoneum: Secondary | ICD-10-CM | POA: Insufficient documentation

## 2016-04-23 DIAGNOSIS — R531 Weakness: Secondary | ICD-10-CM | POA: Insufficient documentation

## 2016-04-23 DIAGNOSIS — R0602 Shortness of breath: Secondary | ICD-10-CM | POA: Insufficient documentation

## 2016-04-23 LAB — CBC WITH DIFFERENTIAL/PLATELET
BASOS ABS: 0 10*3/uL (ref 0–0.1)
BASOS PCT: 1 %
Eosinophils Absolute: 0.1 10*3/uL (ref 0–0.7)
Eosinophils Relative: 2 %
HEMATOCRIT: 33.1 % — AB (ref 40.0–52.0)
HEMOGLOBIN: 10.9 g/dL — AB (ref 13.0–18.0)
LYMPHS PCT: 34 %
Lymphs Abs: 1.4 10*3/uL (ref 1.0–3.6)
MCH: 24.7 pg — ABNORMAL LOW (ref 26.0–34.0)
MCHC: 33 g/dL (ref 32.0–36.0)
MCV: 74.8 fL — ABNORMAL LOW (ref 80.0–100.0)
Monocytes Absolute: 0.6 10*3/uL (ref 0.2–1.0)
Monocytes Relative: 14 %
NEUTROS ABS: 2 10*3/uL (ref 1.4–6.5)
NEUTROS PCT: 49 %
Platelets: 259 10*3/uL (ref 150–440)
RBC: 4.43 MIL/uL (ref 4.40–5.90)
RDW: 19.2 % — AB (ref 11.5–14.5)
WBC: 4.1 10*3/uL (ref 3.8–10.6)

## 2016-04-23 LAB — COMPREHENSIVE METABOLIC PANEL
ALBUMIN: 3.4 g/dL — AB (ref 3.5–5.0)
ALK PHOS: 119 U/L (ref 38–126)
ALT: 11 U/L — AB (ref 17–63)
AST: 17 U/L (ref 15–41)
Anion gap: 7 (ref 5–15)
BILIRUBIN TOTAL: 0.8 mg/dL (ref 0.3–1.2)
BUN: 13 mg/dL (ref 6–20)
CO2: 24 mmol/L (ref 22–32)
CREATININE: 0.92 mg/dL (ref 0.61–1.24)
Calcium: 8.5 mg/dL — ABNORMAL LOW (ref 8.9–10.3)
Chloride: 103 mmol/L (ref 101–111)
GFR calc Af Amer: >60 mL/min (ref >60)
GFR calc non Af Amer: >60 mL/min (ref >60)
GLUCOSE: 134 mg/dL — AB (ref 65–99)
POTASSIUM: 3.7 mmol/L (ref 3.5–5.1)
Sodium: 134 mmol/L — ABNORMAL LOW (ref 135–145)
TOTAL PROTEIN: 7.2 g/dL (ref 6.5–8.1)

## 2016-04-23 LAB — PROTEIN, URINE, RANDOM: TOTAL PROTEIN, URINE: 64 mg/dL

## 2016-04-23 MED ORDER — FLUOROURACIL CHEMO INJECTION 2.5 GM/50ML
400.0000 mg/m2 | Freq: Once | INTRAVENOUS | Status: AC
  Administered 2016-04-23: 900 mg via INTRAVENOUS
  Filled 2016-04-23: qty 18

## 2016-04-23 MED ORDER — SODIUM CHLORIDE 0.9 % IV SOLN
2400.0000 mg/m2 | INTRAVENOUS | Status: DC
  Administered 2016-04-23: 5500 mg via INTRAVENOUS
  Filled 2016-04-23: qty 100

## 2016-04-23 MED ORDER — HEPARIN SOD (PORK) LOCK FLUSH 100 UNIT/ML IV SOLN: 500.0000 [IU] | Freq: Once | INTRAVENOUS | Status: DC | PRN

## 2016-04-23 MED ORDER — SODIUM CHLORIDE 0.9 % IV SOLN
Freq: Once | INTRAVENOUS | Status: AC
  Administered 2016-04-23: 11:00:00 via INTRAVENOUS
  Filled 2016-04-23: qty 1000

## 2016-04-23 MED ORDER — SODIUM CHLORIDE 0.9% FLUSH
10.0000 mL | INTRAVENOUS | Status: DC | PRN
  Administered 2016-04-23: 10 mL
  Filled 2016-04-23: qty 10

## 2016-04-23 MED ORDER — PALONOSETRON HCL INJECTION 0.25 MG/5ML
0.2500 mg | Freq: Once | INTRAVENOUS | Status: AC
  Administered 2016-04-23: 0.25 mg via INTRAVENOUS
  Filled 2016-04-23: qty 5

## 2016-04-23 MED ORDER — OXALIPLATIN CHEMO INJECTION 100 MG/20ML
85.0000 mg/m2 | Freq: Once | INTRAVENOUS | Status: AC
  Administered 2016-04-23: 195 mg via INTRAVENOUS
  Filled 2016-04-23: qty 39

## 2016-04-23 MED ORDER — DEXTROSE 5 % IV SOLN
Freq: Once | INTRAVENOUS | Status: AC
  Administered 2016-04-23: 12:00:00 via INTRAVENOUS
  Filled 2016-04-23: qty 1000

## 2016-04-23 MED ORDER — LEUCOVORIN CALCIUM INJECTION 350 MG
900.0000 mg | Freq: Once | INTRAMUSCULAR | Status: AC
  Administered 2016-04-23: 900 mg via INTRAVENOUS
  Filled 2016-04-23: qty 25

## 2016-04-23 MED ORDER — SODIUM CHLORIDE 0.9 % IV SOLN
5.0000 mg/kg | Freq: Once | INTRAVENOUS | Status: AC
  Administered 2016-04-23: 550 mg via INTRAVENOUS
  Filled 2016-04-23: qty 16

## 2016-04-23 NOTE — Telephone Encounter (Signed)
Spoke to patient's wife regarding- chills postchemotherapy no fevers. Patient on 5 FU pump.. Recommend Tylenol/Benadryl- if not improved good emergency room.

## 2016-04-25 ENCOUNTER — Inpatient Hospital Stay: Payer: Commercial Managed Care - PPO

## 2016-04-25 DIAGNOSIS — C182 Malignant neoplasm of ascending colon: Secondary | ICD-10-CM | POA: Diagnosis not present

## 2016-04-25 MED ORDER — HEPARIN SOD (PORK) LOCK FLUSH 100 UNIT/ML IV SOLN
500.0000 [IU] | Freq: Once | INTRAVENOUS | Status: AC | PRN
Start: 2016-04-25 — End: 2016-04-25
  Administered 2016-04-25: 500 [IU]

## 2016-04-25 MED ORDER — SODIUM CHLORIDE 0.9% FLUSH
10.0000 mL | INTRAVENOUS | Status: DC | PRN
  Administered 2016-04-25: 10 mL
  Filled 2016-04-25: qty 10

## 2016-04-28 ENCOUNTER — Other Ambulatory Visit: Payer: Self-pay | Admitting: *Deleted

## 2016-04-28 DIAGNOSIS — C182 Malignant neoplasm of ascending colon: Secondary | ICD-10-CM

## 2016-04-28 DIAGNOSIS — C772 Secondary and unspecified malignant neoplasm of intra-abdominal lymph nodes: Principal | ICD-10-CM

## 2016-04-28 MED ORDER — MORPHINE SULFATE ER 30 MG PO TBCR: 30.0000 mg | EXTENDED_RELEASE_TABLET | Freq: Two times a day (BID) | ORAL | 0 refills | Status: DC

## 2016-04-28 MED ORDER — OXYCODONE-ACETAMINOPHEN 5-325 MG PO TABS: 1.0000 | ORAL_TABLET | ORAL | 0 refills | Status: DC | PRN

## 2016-04-28 NOTE — Telephone Encounter (Signed)
Per Dr Grayland Ormond, ok to refill Oxycodone and add MS Contin 30 mg bid. Patient advised

## 2016-04-28 NOTE — Telephone Encounter (Signed)
Patient reports taking 1 -2 tablets every 3 - 4 hours (ordered 1 tab every 4 hours as needed) for pain around mid section that feels like a belt tightening up on him. He also reports that he gave one to his wife who was hurting real bad, and that he has 8 tabs left and does not want to run out of med. I educated him that he cannot share his medication with any one and that what will happen is that he will run out of med and it will be to early to refill and he will be in pain because we will not fill it early.  He did report that he had a BM on Thursday and is taking Miralax and Senokot. Please advise

## 2016-04-28 NOTE — Telephone Encounter (Signed)
Requesting refill on  Oxycodone Last got # 60 on 8/3. His 8/9 xray showed early obstipation. I attempted to call patient back for more information. But had to leave message on VM to return my call

## 2016-05-05 ENCOUNTER — Telehealth: Payer: Self-pay | Admitting: *Deleted

## 2016-05-05 NOTE — Telephone Encounter (Signed)
Sounds good. Thank you

## 2016-05-05 NOTE — Telephone Encounter (Signed)
-----   Message from Cephus Richer sent at 05/05/2016 10:42 AM EDT ----- Contact: [phone] Please call pt he has diarrhea and is weak. He wants to know what he can take or can you call him something in? Does want to talk to triage nurse.

## 2016-05-05 NOTE — Telephone Encounter (Signed)
Pt having diarrhea since Friday and has only tried Pepto-bismol for symptoms. Informed Jenny Reichmann that patient will need to try imodium as directed on box and call our office back if symptoms do not improve or if they worsen. Instructed Jenny Reichmann that pt will need to continue drinking fluids to prevent dehydration. Cindy verbalized understanding.

## 2016-05-06 NOTE — Progress Notes (Signed)
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   INTERVAL HISTORY: Patient returns to clinic today for further evaluation and consideration of cycle 4 of FOLFOX plus Avastin. He recently had diarrhea, but this is resolved with Lomotil. He no longer complains of abdominal pain or bloating. He has increased weakness and fatigue. He has no neurologic complaints. He denies any fevers or illnesses. He denies any chest pain or shortness of breath. He has a poor appetite, but denies weight loss. He denies any nausea or vomiting. He has no urinary complaints. Patient offers no further specific complaints.  REVIEW OF SYSTEMS:   Review of Systems  Constitutional: Positive for malaise/fatigue. Negative for fever and weight loss.  Respiratory: Negative.  Negative for cough and shortness of breath.   Cardiovascular: Negative.  Negative for chest pain.  Gastrointestinal: Positive for diarrhea. Negative for abdominal pain, blood in stool, constipation, melena, nausea and vomiting.  Genitourinary: Negative.   Musculoskeletal: Negative.   Neurological: Positive for weakness.  Psychiatric/Behavioral: The patient is nervous/anxious. The patient does not have insomnia.     As per HPI. Otherwise, a complete review of systems is negatve.  PAST MEDICAL HISTORY: Past Medical History:  Diagnosis Date  . Cancer [facility])    colon cancer  . Headache    H/O  . Loosening of tooth    top front  . Shortness of breath dyspnea    on exertion    PAST SURGICAL HISTORY: Past Surgical History:  Procedure Laterality  Date  . COLONOSCOPY WITH PROPOFOL N/A 03/06/2016   Procedure: COLONOSCOPY WITH PROPOFOL;  Surgeon: Lucilla Lame, MD;  Location: [place];  Service: Endoscopy;  Laterality: N/A;  . POLYPECTOMY  03/06/2016   Procedure: POLYPECTOMY;  Surgeon: Lucilla Lame, MD;  Location: [place];  Service: Endoscopy;;  . PORTACATH PLACEMENT Right 03/25/2016   Procedure: INSERTION PORT-A-CATH;  Surgeon: [name], MD;  Location: [facility];  Service: General;  Laterality: Right;  . Right foot surgery  2001    FAMILY HISTORY Family History  Problem Relation Age of Onset  . Pancreatic cancer Maternal Grandfather   . Lymphoma Maternal Grandmother   . CAD Father   . Heart disease Father        ADVANCED DIRECTIVES:    HEALTH MAINTENANCE: Social History  Substance Use Topics  . Smoking status: Current Every Day Smoker    Packs/day: 0.50    Years: 35.00    Types: Cigarettes  . Smokeless tobacco: Never Used     Comment: has more than 30 pack years history  . Alcohol use No     Colonoscopy:  PAP:  Bone density:  Lipid panel:  No Known Allergies  Current Outpatient Prescriptions  Medication Sig Dispense Refill  . furosemide (LASIX) 20 MG tablet Take 1 tablet (20 mg total) by mouth daily as needed for fluid or edema. (Patient taking differently: Take 20 mg by mouth daily as needed for fluid or edema. am) 30 tablet 0  . lidocaine-prilocaine (EMLA) cream Apply to affected area once 30 g 3  . morphine (MS CONTIN) 30 MG 12 hr tablet Take 1 tablet (30 mg total) by  mouth every 12 (twelve) hours. 28 tablet 0  . oxyCODONE-acetaminophen (PERCOCET/ROXICET) 5-325 MG tablet Take 1 tablet by mouth every 4 (four) hours as needed for severe pain. 60 tablet 0  . polyethylene glycol (MIRALAX / GLYCOLAX) packet Take 17 g by mouth daily.     No current facility-administered medications for this visit.    Facility-Administered Medications Ordered in Other Visits  Medication Dose Route Frequency  Provider Last Rate Last Dose  . dextrose 5 % solution   Intravenous Once Lloyd Huger, MD      . fluorouracil (ADRUCIL) 5,500 mg in sodium chloride 0.9 % 140 mL chemo infusion  2,400 mg/m2 (Treatment Plan Recorded) Intravenous 1 day or 1 dose Lloyd Huger, MD      . fluorouracil (ADRUCIL) chemo injection 900 mg  400 mg/m2 (Treatment Plan Recorded) Intravenous Once Lloyd Huger, MD      . heparin lock flush 100 unit/mL  500 Units Intracatheter Once PRN Lloyd Huger, MD      . leucovorin 900 mg in dextrose 5 % 250 mL infusion  900 mg Intravenous Once Lloyd Huger, MD      . oxaliplatin (ELOXATIN) 195 mg in dextrose 5 % 500 mL chemo infusion  85 mg/m2 (Treatment Plan Recorded) Intravenous Once Lloyd Huger, MD      . sodium chloride flush (NS) 0.9 % injection 10 mL  10 mL Intravenous PRN Lloyd Huger, MD   10 mL at 04/09/16 1014  . sodium chloride flush (NS) 0.9 % injection 10 mL  10 mL Intracatheter PRN Lloyd Huger, MD   10 mL at 05/07/16 1043    OBJECTIVE: Vitals:   05/07/16 0952  BP: (!) 158/95  Pulse: 91  Temp: 98.2 F (36.8 C)     Body mass index is 31.27 kg/m.    ECOG FS:1 - Symptomatic but completely ambulatory  General: Well-developed, well-nourished, no acute distress. Eyes: Pink conjunctiva, anicteric sclera. Lungs: Clear to auscultation bilaterally. Heart: Regular rate and rhythm. No rubs, murmurs, or gallops. Abdomen: Mildly distended, nontender. Musculoskeletal: No edema, cyanosis, or clubbing. Neuro: Alert, answering all questions appropriately. Cranial nerves grossly intact. Skin: No rashes or petechiae noted. Psych: Normal affect.    LAB RESULTS:  Lab Results  Component Value Date   NA 135 05/07/2016   K 3.5 05/07/2016   CL 107 05/07/2016   CO2 24 05/07/2016   GLUCOSE 100 (H) 05/07/2016   BUN 9 05/07/2016   CREATININE 0.77 05/07/2016   CALCIUM 8.2 (L) 05/07/2016   PROT 6.9 05/07/2016   ALBUMIN 3.2 (L)  05/07/2016   AST 18 05/07/2016   ALT 12 (L) 05/07/2016   ALKPHOS 96 05/07/2016   BILITOT 0.5 05/07/2016   GFRNONAA >60 05/07/2016   GFRAA >60 05/07/2016    Lab Results  Component Value Date   WBC 4.1 05/07/2016   NEUTROABS 1.6 05/07/2016   HGB 10.8 (L) 05/07/2016   HCT 32.3 (L) 05/07/2016   MCV 76.4 (L) 05/07/2016   PLT 224 05/07/2016   Lab Results  Component Value Date   CEA 3.1 02/18/2016     STUDIES: Dg Abd Acute W/chest  Result Date: 04/23/2016 CLINICAL DATA:  Constipation x 1.5wks, generalized abd pain, pt had 3rd chemo treatment today. Hx - colon and stomach cancer diagnosed 6-8 wks ago, no prior surgeries. EXAM: DG ABDOMEN ACUTE W/ 1V CHEST COMPARISON:  06/25/2016 and previous FINDINGS: Heart size and mediastinal contours are within normal limits. Improved aeration with  resolution of the basilar opacities. Stable right IJ port catheter, tip near the proximal SVC. No free air. Small bowel decompressed. Moderate fecal material in the descending colon and distending the rectum, with gaseous distention the sigmoid colon. There are no abnormal calcifications. Regional bones unremarkable. IMPRESSION: 1. Fecal distention of the rectum without overt obstruction ; Possible early obstipation. 2. No free air. 3. No acute cardiopulmonary disease. Negative abdominal radiographs. Electronically Signed   By: Lucrezia Europe M.D.   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  PLAN:    1. 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 Proceed with cycle 4 of FOLFOX plus Avastin today. Plan to do a total of 12 cycles with reimaging after 6. CEA and CA-19-9 are within normal limits. Return to clinic in 2 days for pump removal and then in 2 weeks for further  evaluation and consideration of cycle 5. Of note, patient became irritable and had difficulty sleeping secondary to dexamethasone premedication, therefore this was discontinued from his treatment regimen. 2. Pain: Continue oxycodone as needed. 3. Diarrhea: Continue Lomotil as needed.  4. Difficulty sleeping: Discontinue dexamethasone as above. Continue 5 mg Ambien as needed.   Patient expressed understanding and was in agreement with this plan. He also understands that He can call clinic at any time with any questions, concerns, or complaints.   Lloyd Huger, MD   05/07/2016 11:20 AM

## 2016-05-07 ENCOUNTER — Inpatient Hospital Stay: Payer: Commercial Managed Care - PPO

## 2016-05-07 ENCOUNTER — Inpatient Hospital Stay (HOSPITAL_BASED_OUTPATIENT_CLINIC_OR_DEPARTMENT_OTHER): Payer: Commercial Managed Care - PPO | Admitting: Oncology

## 2016-05-07 VITALS — BP 158/95 | HR 91 | Temp 98.2°F | Ht 69.0 in | Wt 211.8 lb

## 2016-05-07 DIAGNOSIS — C801 Malignant (primary) neoplasm, unspecified: Secondary | ICD-10-CM

## 2016-05-07 DIAGNOSIS — F419 Anxiety disorder, unspecified: Secondary | ICD-10-CM

## 2016-05-07 DIAGNOSIS — R197 Diarrhea, unspecified: Secondary | ICD-10-CM

## 2016-05-07 DIAGNOSIS — R531 Weakness: Secondary | ICD-10-CM

## 2016-05-07 DIAGNOSIS — G4700 Insomnia, unspecified: Secondary | ICD-10-CM

## 2016-05-07 DIAGNOSIS — Z79899 Other long term (current) drug therapy: Secondary | ICD-10-CM

## 2016-05-07 DIAGNOSIS — G893 Neoplasm related pain (acute) (chronic): Secondary | ICD-10-CM

## 2016-05-07 DIAGNOSIS — C182 Malignant neoplasm of ascending colon: Secondary | ICD-10-CM

## 2016-05-07 DIAGNOSIS — C786 Secondary malignant neoplasm of retroperitoneum and peritoneum: Secondary | ICD-10-CM | POA: Diagnosis not present

## 2016-05-07 DIAGNOSIS — R630 Anorexia: Secondary | ICD-10-CM

## 2016-05-07 DIAGNOSIS — C772 Secondary and unspecified malignant neoplasm of intra-abdominal lymph nodes: Principal | ICD-10-CM

## 2016-05-07 DIAGNOSIS — R0602 Shortness of breath: Secondary | ICD-10-CM

## 2016-05-07 DIAGNOSIS — F17210 Nicotine dependence, cigarettes, uncomplicated: Secondary | ICD-10-CM

## 2016-05-07 DIAGNOSIS — R5383 Other fatigue: Secondary | ICD-10-CM

## 2016-05-07 LAB — CBC WITH DIFFERENTIAL/PLATELET
Basophils Absolute: 0 10*3/uL (ref 0–0.1)
Basophils Relative: 1 %
EOS ABS: 0.1 10*3/uL (ref 0–0.7)
EOS PCT: 3 %
HCT: 32.3 % — ABNORMAL LOW (ref 40.0–52.0)
HEMOGLOBIN: 10.8 g/dL — AB (ref 13.0–18.0)
LYMPHS ABS: 1.8 10*3/uL (ref 1.0–3.6)
LYMPHS PCT: 44 %
MCH: 25.4 pg — AB (ref 26.0–34.0)
MCHC: 33.3 g/dL (ref 32.0–36.0)
MCV: 76.4 fL — ABNORMAL LOW (ref 80.0–100.0)
MONOS PCT: 14 %
Monocytes Absolute: 0.6 10*3/uL (ref 0.2–1.0)
Neutro Abs: 1.6 10*3/uL (ref 1.4–6.5)
Neutrophils Relative %: 38 %
Platelets: 224 10*3/uL (ref 150–440)
RBC: 4.23 MIL/uL — ABNORMAL LOW (ref 4.40–5.90)
RDW: 22.5 % — ABNORMAL HIGH (ref 11.5–14.5)
WBC: 4.1 10*3/uL (ref 3.8–10.6)

## 2016-05-07 LAB — COMPREHENSIVE METABOLIC PANEL
ALK PHOS: 96 U/L (ref 38–126)
ALT: 12 U/L — ABNORMAL LOW (ref 17–63)
ANION GAP: 4 — AB (ref 5–15)
AST: 18 U/L (ref 15–41)
Albumin: 3.2 g/dL — ABNORMAL LOW (ref 3.5–5.0)
BILIRUBIN TOTAL: 0.5 mg/dL (ref 0.3–1.2)
BUN: 9 mg/dL (ref 6–20)
CALCIUM: 8.2 mg/dL — AB (ref 8.9–10.3)
CO2: 24 mmol/L (ref 22–32)
CREATININE: 0.77 mg/dL (ref 0.61–1.24)
Chloride: 107 mmol/L (ref 101–111)
GFR calc non Af Amer: >60 mL/min (ref >60)
GLUCOSE: 100 mg/dL — AB (ref 65–99)
Potassium: 3.5 mmol/L (ref 3.5–5.1)
Sodium: 135 mmol/L (ref 135–145)
TOTAL PROTEIN: 6.9 g/dL (ref 6.5–8.1)

## 2016-05-07 LAB — PROTEIN, URINE, RANDOM: Total Protein, Urine: 72 mg/dL

## 2016-05-07 MED ORDER — SODIUM CHLORIDE 0.9% FLUSH
10.0000 mL | INTRAVENOUS | Status: DC | PRN
  Administered 2016-05-07: 10 mL
  Filled 2016-05-07: qty 10

## 2016-05-07 MED ORDER — SODIUM CHLORIDE 0.9 % IV SOLN
2400.0000 mg/m2 | INTRAVENOUS | Status: DC
  Administered 2016-05-07: 5500 mg via INTRAVENOUS
  Filled 2016-05-07: qty 110

## 2016-05-07 MED ORDER — SODIUM CHLORIDE 0.9 % IV SOLN
Freq: Once | INTRAVENOUS | Status: AC
  Administered 2016-05-07: 11:00:00 via INTRAVENOUS
  Filled 2016-05-07: qty 1000

## 2016-05-07 MED ORDER — DEXTROSE 5 % IV SOLN
900.0000 mg | Freq: Once | INTRAVENOUS | Status: AC
  Administered 2016-05-07: 900 mg via INTRAVENOUS
  Filled 2016-05-07: qty 45

## 2016-05-07 MED ORDER — HEPARIN SOD (PORK) LOCK FLUSH 100 UNIT/ML IV SOLN: 500.0000 [IU] | Freq: Once | INTRAVENOUS | Status: DC | PRN

## 2016-05-07 MED ORDER — OXALIPLATIN CHEMO INJECTION 100 MG/20ML
85.0000 mg/m2 | Freq: Once | INTRAVENOUS | Status: AC
  Administered 2016-05-07: 195 mg via INTRAVENOUS
  Filled 2016-05-07: qty 39

## 2016-05-07 MED ORDER — SODIUM CHLORIDE 0.9 % IV SOLN
5.0000 mg/kg | Freq: Once | INTRAVENOUS | Status: AC
  Administered 2016-05-07: 550 mg via INTRAVENOUS
  Filled 2016-05-07: qty 22

## 2016-05-07 MED ORDER — DEXTROSE 5 % IV SOLN
Freq: Once | INTRAVENOUS | Status: AC
  Administered 2016-05-07: 11:00:00 via INTRAVENOUS
  Filled 2016-05-07: qty 1000

## 2016-05-07 MED ORDER — PALONOSETRON HCL INJECTION 0.25 MG/5ML
0.2500 mg | Freq: Once | INTRAVENOUS | Status: AC
  Administered 2016-05-07: 0.25 mg via INTRAVENOUS
  Filled 2016-05-07: qty 5

## 2016-05-07 MED ORDER — FLUOROURACIL CHEMO INJECTION 2.5 GM/50ML
400.0000 mg/m2 | Freq: Once | INTRAVENOUS | Status: AC
  Administered 2016-05-07: 900 mg via INTRAVENOUS
  Filled 2016-05-07: qty 18

## 2016-05-07 NOTE — Progress Notes (Signed)
Patient here for pre-treatment check. Diarrhea has resolved.

## 2016-05-09 ENCOUNTER — Inpatient Hospital Stay: Payer: Commercial Managed Care - PPO

## 2016-05-09 VITALS — BP 123/82 | HR 84 | Temp 97.7°F | Resp 22

## 2016-05-09 DIAGNOSIS — C182 Malignant neoplasm of ascending colon: Secondary | ICD-10-CM | POA: Diagnosis not present

## 2016-05-09 MED ORDER — SODIUM CHLORIDE 0.9% FLUSH
10.0000 mL | INTRAVENOUS | Status: DC | PRN
  Administered 2016-05-09: 10 mL
  Filled 2016-05-09: qty 10

## 2016-05-09 MED ORDER — HEPARIN SOD (PORK) LOCK FLUSH 100 UNIT/ML IV SOLN
500.0000 [IU] | Freq: Once | INTRAVENOUS | Status: AC | PRN
  Administered 2016-05-09: 500 [IU]

## 2016-05-09 MED ORDER — HEPARIN SOD (PORK) LOCK FLUSH 100 UNIT/ML IV SOLN
INTRAVENOUS | Status: AC
  Filled 2016-05-09: qty 5

## 2016-05-20 NOTE — Progress Notes (Signed)
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   INTERVAL HISTORY: Patient returns to clinic today for further evaluation and consideration of cycle 5 of FOLFOX plus Avastin. He is highly anxious and tearful today. He is also depressed. He has persistent weakness and fatigue and states he feels terrible, but also admits this may be related to his anxiety.  He has no neurologic complaints. He denies any fevers or illnesses. He denies any chest pain or shortness of breath. He has a poor appetite, but denies weight loss. He denies any nausea or vomiting. He has no urinary complaints. Patient offers no further specific complaints.  REVIEW OF SYSTEMS:   Review of Systems  Constitutional: Positive for malaise/fatigue. Negative for fever and weight loss.  Respiratory: Negative.  Negative for cough and shortness of breath.   Cardiovascular: Negative.  Negative for chest pain.  Gastrointestinal: Negative for abdominal pain, blood in stool, constipation, diarrhea, melena, nausea and vomiting.  Genitourinary: Negative.   Musculoskeletal: Negative.   Neurological: Positive for weakness.  Psychiatric/Behavioral: Positive for depression. The patient is nervous/anxious and has insomnia.     As per HPI. Otherwise, a complete review of systems is negative.  PAST MEDICAL HISTORY: Past Medical History:  Diagnosis Date  . Cancer [facility])    colon cancer  . Headache    H/O  . Loosening of tooth    top front  . Shortness of breath dyspnea    on exertion    PAST SURGICAL HISTORY: Past Surgical History:    Procedure Laterality Date  . COLONOSCOPY WITH PROPOFOL N/A 03/06/2016   Procedure: COLONOSCOPY WITH PROPOFOL;  Surgeon: Lucilla Lame, MD;  Location: [place];  Service: Endoscopy;  Laterality: N/A;  . POLYPECTOMY  03/06/2016   Procedure: POLYPECTOMY;  Surgeon: Lucilla Lame, MD;  Location: [place];  Service: Endoscopy;;  . PORTACATH PLACEMENT Right 03/25/2016   Procedure: INSERTION PORT-A-CATH;  Surgeon: [name], MD;  Location: [facility];  Service: General;  Laterality: Right;  . Right foot surgery  2001    FAMILY HISTORY Family History  Problem Relation Age of Onset  . Pancreatic cancer Maternal Grandfather   . Lymphoma Maternal Grandmother   . CAD Father   . Heart disease Father        ADVANCED DIRECTIVES:    HEALTH MAINTENANCE: Social History  Substance Use Topics  . Smoking status: Current Every Day Smoker    Packs/day: 0.50    Years: 35.00    Types: Cigarettes  . Smokeless tobacco: Never Used     Comment: has more than 30 pack years history  . Alcohol use No     Colonoscopy:  PAP:  Bone density:  Lipid panel:  No Known Allergies  Current Outpatient Prescriptions  Medication Sig Dispense Refill  . ALPRAZolam (XANAX) 0.25 MG tablet Take 1 tablet (0.25 mg total) by mouth 3 (three) times daily as needed for anxiety. 30 tablet 0  . furosemide (LASIX) 20 MG tablet Take 1 tablet (20 mg total) by mouth daily as needed for fluid or edema. (Patient taking differently: Take 20 mg by mouth daily as needed for fluid or edema. am)  30 tablet 0  . lidocaine-prilocaine (EMLA) cream Apply to affected area once 30 g 3  . morphine (MS CONTIN) 30 MG 12 hr tablet Take 1 tablet (30 mg total) by mouth every 12 (twelve) hours. 28 tablet 0  . oxyCODONE-acetaminophen (PERCOCET/ROXICET) 5-325 MG tablet Take 1 tablet by mouth every 4 (four) hours as needed for severe pain. 60 tablet 0  . polyethylene glycol (MIRALAX / GLYCOLAX) packet Take 17 g by mouth daily.      No current facility-administered medications for this visit.    Facility-Administered Medications Ordered in Other Visits  Medication Dose Route Frequency Provider Last Rate Last Dose  . sodium chloride flush (NS) 0.9 % injection 10 mL  10 mL Intravenous PRN Lloyd Huger, MD   10 mL at 04/09/16 1014    OBJECTIVE: There were no vitals filed for this visit.   There is no height or weight on file to calculate BMI.    ECOG FS:1 - Symptomatic but completely ambulatory  General: Well-developed, well-nourished, no acute distress. Eyes: Pink conjunctiva, anicteric sclera. Lungs: Clear to auscultation bilaterally. Heart: Regular rate and rhythm. No rubs, murmurs, or gallops. Abdomen: Mildly distended, nontender. Musculoskeletal: No edema, cyanosis, or clubbing. Neuro: Alert, answering all questions appropriately. Cranial nerves grossly intact. Skin: No rashes or petechiae noted. Psych: Normal affect.   LAB RESULTS:  Lab Results  Component Value Date   NA 135 05/21/2016   K 3.9 05/21/2016   CL 106 05/21/2016   CO2 22 05/21/2016   GLUCOSE 132 (H) 05/21/2016   BUN 11 05/21/2016   CREATININE 0.78 05/21/2016   CALCIUM 8.7 (L) 05/21/2016   PROT 7.3 05/21/2016   ALBUMIN 3.5 05/21/2016   AST 15 05/21/2016   ALT 8 (L) 05/21/2016   ALKPHOS 101 05/21/2016   BILITOT 0.4 05/21/2016   GFRNONAA >60 05/21/2016   GFRAA >60 05/21/2016    Lab Results  Component Value Date   WBC 5.0 05/21/2016   NEUTROABS 2.7 05/21/2016   HGB 11.0 (L) 05/21/2016   HCT 33.4 (L) 05/21/2016   MCV 78.6 (L) 05/21/2016   PLT 248 05/21/2016   Lab Results  Component Value Date   CEA 3.1 02/18/2016     STUDIES: Dg Abd Acute W/chest  Result Date: 04/23/2016 CLINICAL DATA:  Constipation x 1.5wks, generalized abd pain, pt had 3rd chemo treatment today. Hx - colon and stomach cancer diagnosed 6-8 wks ago, no prior surgeries. EXAM: DG ABDOMEN ACUTE W/ 1V CHEST COMPARISON:  06/25/2016 and previous FINDINGS:  Heart size and mediastinal contours are within normal limits. Improved aeration with resolution of the basilar opacities. Stable right IJ port catheter, tip near the proximal SVC. No free air. Small bowel decompressed. Moderate fecal material in the descending colon and distending the rectum, with gaseous distention the sigmoid colon. There are no abnormal calcifications. Regional bones unremarkable. IMPRESSION: 1. Fecal distention of the rectum without overt obstruction ; Possible early obstipation. 2. No free air. 3. No acute cardiopulmonary disease. Negative abdominal radiographs. Electronically Signed   By: Lucrezia Europe M.D.   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  PLAN:    1. 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 Delay cycle 5 of FOLFOX plus Avastin today. Plan to do a total of 12 cycles  with reimaging after 6. CEA and CA-19-9 are within normal limits. Return to clinic in 1 week for further evaluation and reconsideration of cycle 5. Of note, patient became irritable and had difficulty sleeping secondary to dexamethasone premedication, therefore this was discontinued from his treatment regimen. 2. Pain: Continue oxycodone as needed. 3. Diarrhea: Continue Lomotil as needed.  4. Difficulty sleeping: Discontinue dexamethasone as above. Ambien has been discontinued and patient was given a prescription for Xanax. 5. Anxiety: Xanax as needed 3 times a day. 6. Depression: Will reevaluate at next clinic visit and consider adding an antidepressant in the near future.   Patient expressed understanding and was in agreement with this plan. He also understands that He can call clinic at any time with any questions, concerns, or complaints.   Lloyd Huger, MD   05/23/2016 11:22 AM

## 2016-05-21 ENCOUNTER — Inpatient Hospital Stay: Payer: Commercial Managed Care - PPO | Attending: Oncology

## 2016-05-21 ENCOUNTER — Inpatient Hospital Stay (HOSPITAL_BASED_OUTPATIENT_CLINIC_OR_DEPARTMENT_OTHER): Payer: Commercial Managed Care - PPO | Admitting: Oncology

## 2016-05-21 ENCOUNTER — Inpatient Hospital Stay: Payer: Commercial Managed Care - PPO

## 2016-05-21 VITALS — BP 138/98 | HR 80 | Temp 97.5°F | Resp 20

## 2016-05-21 DIAGNOSIS — F419 Anxiety disorder, unspecified: Secondary | ICD-10-CM | POA: Diagnosis not present

## 2016-05-21 DIAGNOSIS — Z5111 Encounter for antineoplastic chemotherapy: Secondary | ICD-10-CM | POA: Insufficient documentation

## 2016-05-21 DIAGNOSIS — C801 Malignant (primary) neoplasm, unspecified: Principal | ICD-10-CM

## 2016-05-21 DIAGNOSIS — C786 Secondary malignant neoplasm of retroperitoneum and peritoneum: Secondary | ICD-10-CM | POA: Diagnosis not present

## 2016-05-21 DIAGNOSIS — C182 Malignant neoplasm of ascending colon: Secondary | ICD-10-CM | POA: Diagnosis not present

## 2016-05-21 DIAGNOSIS — R197 Diarrhea, unspecified: Secondary | ICD-10-CM | POA: Diagnosis not present

## 2016-05-21 DIAGNOSIS — R630 Anorexia: Secondary | ICD-10-CM | POA: Diagnosis not present

## 2016-05-21 DIAGNOSIS — G4700 Insomnia, unspecified: Secondary | ICD-10-CM

## 2016-05-21 DIAGNOSIS — R5383 Other fatigue: Secondary | ICD-10-CM | POA: Diagnosis not present

## 2016-05-21 DIAGNOSIS — G893 Neoplasm related pain (acute) (chronic): Secondary | ICD-10-CM

## 2016-05-21 DIAGNOSIS — C772 Secondary and unspecified malignant neoplasm of intra-abdominal lymph nodes: Secondary | ICD-10-CM

## 2016-05-21 DIAGNOSIS — Z79899 Other long term (current) drug therapy: Secondary | ICD-10-CM

## 2016-05-21 DIAGNOSIS — R0602 Shortness of breath: Secondary | ICD-10-CM | POA: Insufficient documentation

## 2016-05-21 DIAGNOSIS — R531 Weakness: Secondary | ICD-10-CM | POA: Insufficient documentation

## 2016-05-21 DIAGNOSIS — Z800 Family history of malignant neoplasm of digestive organs: Secondary | ICD-10-CM | POA: Insufficient documentation

## 2016-05-21 DIAGNOSIS — F17210 Nicotine dependence, cigarettes, uncomplicated: Secondary | ICD-10-CM | POA: Insufficient documentation

## 2016-05-21 DIAGNOSIS — F329 Major depressive disorder, single episode, unspecified: Secondary | ICD-10-CM

## 2016-05-21 DIAGNOSIS — Z01812 Encounter for preprocedural laboratory examination: Secondary | ICD-10-CM

## 2016-05-21 LAB — CBC WITH DIFFERENTIAL/PLATELET
BASOS PCT: 1 %
Basophils Absolute: 0 10*3/uL (ref 0–0.1)
Eosinophils Absolute: 0.1 10*3/uL (ref 0–0.7)
Eosinophils Relative: 2 %
HEMATOCRIT: 33.4 % — AB (ref 40.0–52.0)
HEMOGLOBIN: 11 g/dL — AB (ref 13.0–18.0)
LYMPHS ABS: 1.6 10*3/uL (ref 1.0–3.6)
LYMPHS PCT: 33 %
MCH: 26 pg (ref 26.0–34.0)
MCHC: 33 g/dL (ref 32.0–36.0)
MCV: 78.6 fL — AB (ref 80.0–100.0)
MONOS PCT: 11 %
Monocytes Absolute: 0.5 10*3/uL (ref 0.2–1.0)
NEUTROS ABS: 2.7 10*3/uL (ref 1.4–6.5)
NEUTROS PCT: 55 %
Platelets: 248 10*3/uL (ref 150–440)
RBC: 4.25 MIL/uL — ABNORMAL LOW (ref 4.40–5.90)
RDW: 24.1 % — ABNORMAL HIGH (ref 11.5–14.5)
WBC: 5 10*3/uL (ref 3.8–10.6)

## 2016-05-21 LAB — COMPREHENSIVE METABOLIC PANEL
ALBUMIN: 3.5 g/dL (ref 3.5–5.0)
ALK PHOS: 101 U/L (ref 38–126)
ALT: 8 U/L — ABNORMAL LOW (ref 17–63)
ANION GAP: 7 (ref 5–15)
AST: 15 U/L (ref 15–41)
BILIRUBIN TOTAL: 0.4 mg/dL (ref 0.3–1.2)
BUN: 11 mg/dL (ref 6–20)
CALCIUM: 8.7 mg/dL — AB (ref 8.9–10.3)
CO2: 22 mmol/L (ref 22–32)
Chloride: 106 mmol/L (ref 101–111)
Creatinine, Ser: 0.78 mg/dL (ref 0.61–1.24)
GFR calc Af Amer: >60 mL/min (ref >60)
GLUCOSE: 132 mg/dL — AB (ref 65–99)
Potassium: 3.9 mmol/L (ref 3.5–5.1)
Sodium: 135 mmol/L (ref 135–145)
TOTAL PROTEIN: 7.3 g/dL (ref 6.5–8.1)

## 2016-05-21 LAB — PROTEIN, URINE, RANDOM: TOTAL PROTEIN, URINE: 34 mg/dL

## 2016-05-21 MED ORDER — PALONOSETRON HCL INJECTION 0.25 MG/5ML: 0.2500 mg | Freq: Once | INTRAVENOUS | Status: DC

## 2016-05-21 MED ORDER — HEPARIN SOD (PORK) LOCK FLUSH 100 UNIT/ML IV SOLN
500.0000 [IU] | Freq: Once | INTRAVENOUS | Status: AC
  Administered 2016-05-21: 500 [IU] via INTRAVENOUS

## 2016-05-21 MED ORDER — SODIUM CHLORIDE 0.9 % IV SOLN
5.0000 mg/kg | Freq: Once | INTRAVENOUS | Status: DC
  Filled 2016-05-21: qty 22

## 2016-05-21 MED ORDER — SODIUM CHLORIDE 0.9 % IV SOLN
2400.0000 mg/m2 | INTRAVENOUS | Status: DC
  Filled 2016-05-21: qty 110

## 2016-05-21 MED ORDER — OXALIPLATIN CHEMO INJECTION 100 MG/20ML
85.0000 mg/m2 | Freq: Once | INTRAVENOUS | Status: DC
  Filled 2016-05-21: qty 39

## 2016-05-21 MED ORDER — HEPARIN SOD (PORK) LOCK FLUSH 100 UNIT/ML IV SOLN
INTRAVENOUS | Status: AC
  Filled 2016-05-21: qty 5

## 2016-05-21 MED ORDER — SODIUM CHLORIDE 0.9 % IV SOLN
Freq: Once | INTRAVENOUS | Status: AC
  Administered 2016-05-21: 11:00:00 via INTRAVENOUS
  Filled 2016-05-21: qty 1000

## 2016-05-21 MED ORDER — DEXTROSE 5 % IV SOLN
900.0000 mg | Freq: Once | INTRAVENOUS | Status: DC
  Filled 2016-05-21: qty 45

## 2016-05-21 MED ORDER — ALPRAZOLAM 0.25 MG PO TABS: 0.2500 mg | ORAL_TABLET | Freq: Three times a day (TID) | ORAL | 0 refills | Status: DC | PRN

## 2016-05-21 MED ORDER — DEXTROSE 5 % IV SOLN
Freq: Once | INTRAVENOUS | Status: DC
  Filled 2016-05-21: qty 1000

## 2016-05-21 MED ORDER — OXYCODONE-ACETAMINOPHEN 5-325 MG PO TABS: 1.0000 | ORAL_TABLET | ORAL | 0 refills | Status: DC | PRN

## 2016-05-21 MED ORDER — FLUOROURACIL CHEMO INJECTION 2.5 GM/50ML
400.0000 mg/m2 | Freq: Once | INTRAVENOUS | Status: DC
  Filled 2016-05-21: qty 18

## 2016-05-22 ENCOUNTER — Inpatient Hospital Stay: Payer: Commercial Managed Care - PPO

## 2016-05-23 ENCOUNTER — Inpatient Hospital Stay: Payer: Commercial Managed Care - PPO

## 2016-05-28 ENCOUNTER — Other Ambulatory Visit: Payer: Commercial Managed Care - PPO

## 2016-05-28 ENCOUNTER — Ambulatory Visit: Payer: Commercial Managed Care - PPO | Admitting: Oncology

## 2016-05-28 ENCOUNTER — Ambulatory Visit: Payer: Commercial Managed Care - PPO

## 2016-06-02 ENCOUNTER — Telehealth: Payer: Self-pay | Admitting: *Deleted

## 2016-06-02 NOTE — Telephone Encounter (Signed)
Spoke with patient and he just wanted to let Dr. Grayland Ormond know that he is much stronger than his last visit and that he is ready for his treatment. Also requests refill of pain medication. Informed pt that can get his prescription at his appt on Wednesday. Pt verbalized understanding.

## 2016-06-02 NOTE — Progress Notes (Signed)
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   INTERVAL HISTORY: Patient returns to clinic today for further evaluation and reconsideration of cycle 5 of FOLFOX plus Avastin. His move has significantly improved and he feels back to his baseline. He does not complain of weakness or fatigue today. He has a good appetite and has gained weight in the interim.  He has no neurologic complaints. He denies any fevers or illnesses. He denies any chest pain or shortness of breath. He denies any nausea, vomiting, constipation, or diarrhea.  He has no urinary complaints. Patient offers no specific complaints today.  REVIEW OF SYSTEMS:   Review of Systems  Constitutional: Negative for fever, malaise/fatigue and weight loss.  Respiratory: Negative.  Negative for cough and shortness of breath.   Cardiovascular: Negative.  Negative for chest pain.  Gastrointestinal: Negative for abdominal pain, blood in stool, constipation, diarrhea, melena, nausea and vomiting.  Genitourinary: Negative.   Musculoskeletal: Negative.   Neurological: Negative for weakness.  Psychiatric/Behavioral: Negative for depression. The patient is not nervous/anxious and does not have insomnia.     As per HPI. Otherwise, a complete review of systems is negative.  PAST MEDICAL HISTORY: Past Medical History:  Diagnosis Date  . Cancer [facility])    colon cancer  . Headache    H/O  . Loosening of tooth    top front  . Shortness of breath dyspnea    on exertion    PAST SURGICAL HISTORY: Past Surgical History:  Procedure  Laterality Date  . COLONOSCOPY WITH PROPOFOL N/A 03/06/2016   Procedure: COLONOSCOPY WITH PROPOFOL;  Surgeon: Lucilla Lame, MD;  Location: [place];  Service: Endoscopy;  Laterality: N/A;  . POLYPECTOMY  03/06/2016   Procedure: POLYPECTOMY;  Surgeon: Lucilla Lame, MD;  Location: [place];  Service: Endoscopy;;  . PORTACATH PLACEMENT Right 03/25/2016   Procedure: INSERTION PORT-A-CATH;  Surgeon: [name], MD;  Location: [facility];  Service: General;  Laterality: Right;  . Right foot surgery  2001    FAMILY HISTORY Family History  Problem Relation Age of Onset  . Pancreatic cancer Maternal Grandfather   . Lymphoma Maternal Grandmother   . CAD Father   . Heart disease Father        ADVANCED DIRECTIVES:    HEALTH MAINTENANCE: Social History  Substance Use Topics  . Smoking status: Current Every Day Smoker    Packs/day: 0.50    Years: 35.00    Types: Cigarettes  . Smokeless tobacco: Never Used     Comment: has more than 30 pack years history  . Alcohol use No     Colonoscopy:  PAP:  Bone density:  Lipid panel:  No Known Allergies  Current Outpatient Prescriptions  Medication Sig Dispense Refill  . ALPRAZolam (XANAX) 0.25 MG tablet Take 1 tablet (0.25 mg total) by mouth 3 (three) times daily as needed for anxiety. 30 tablet 0  . furosemide (LASIX) 20 MG tablet Take 1 tablet (20 mg total) by mouth daily as needed for fluid or edema. (Patient taking differently: Take 20 mg by mouth daily as needed for fluid or edema. am) 30 tablet 0  .  lidocaine-prilocaine (EMLA) cream Apply to affected area once 30 g 3  . morphine (MS CONTIN) 30 MG 12 hr tablet Take 1 tablet (30 mg total) by mouth every 12 (twelve) hours. 28 tablet 0  . oxyCODONE-acetaminophen (PERCOCET/ROXICET) 5-325 MG tablet Take 1 tablet by mouth every 4 (four) hours as needed for severe pain. 60 tablet 0  . polyethylene glycol (MIRALAX / GLYCOLAX) packet Take 17 g by mouth daily.     No current  facility-administered medications for this visit.    Facility-Administered Medications Ordered in Other Visits  Medication Dose Route Frequency Provider Last Rate Last Dose  . bevacizumab (AVASTIN) 475 mg in sodium chloride 0.9 % 100 mL chemo infusion  5 mg/kg (Order-Specific) Intravenous Once Lloyd Huger, MD      . fluorouracil (ADRUCIL) 5,500 mg in sodium chloride 0.9 % 140 mL chemo infusion  2,400 mg/m2 (Treatment Plan Recorded) Intravenous 1 day or 1 dose Lloyd Huger, MD      . fluorouracil (ADRUCIL) chemo injection 900 mg  400 mg/m2 (Treatment Plan Recorded) Intravenous Once Lloyd Huger, MD      . sodium chloride flush (NS) 0.9 % injection 10 mL  10 mL Intravenous PRN Lloyd Huger, MD   10 mL at 04/09/16 1014  . sodium chloride flush (NS) 0.9 % injection 10 mL  10 mL Intracatheter PRN Lloyd Huger, MD        OBJECTIVE: Vitals:   06/04/16 0901  BP: 128/86  Pulse: 88  Temp: 98 F (36.7 C)     Body mass index is 31.47 kg/m.    ECOG FS:1 - Symptomatic but completely ambulatory  General: Well-developed, well-nourished, no acute distress. Eyes: Pink conjunctiva, anicteric sclera. Lungs: Clear to auscultation bilaterally. Heart: Regular rate and rhythm. No rubs, murmurs, or gallops. Abdomen: Mildly distended, nontender. Musculoskeletal: No edema, cyanosis, or clubbing. Neuro: Alert, answering all questions appropriately. Cranial nerves grossly intact. Skin: No rashes or petechiae noted. Psych: Normal affect.   LAB RESULTS:  Lab Results  Component Value Date   NA 134 (L) 06/04/2016   K 4.4 06/04/2016   CL 104 06/04/2016   CO2 24 06/04/2016   GLUCOSE 92 06/04/2016   BUN 13 06/04/2016   CREATININE 0.93 06/04/2016   CALCIUM 8.5 (L) 06/04/2016   PROT 7.7 06/04/2016   ALBUMIN 3.5 06/04/2016   AST 17 06/04/2016   ALT 12 (L) 06/04/2016   ALKPHOS 95 06/04/2016   BILITOT 0.4 06/04/2016   GFRNONAA >60 06/04/2016   GFRAA >60 06/04/2016    Lab  Results  Component Value Date   WBC 10.6 06/04/2016   NEUTROABS 7.3 (H) 06/04/2016   HGB 11.8 (L) 06/04/2016   HCT 35.3 (L) 06/04/2016   MCV 80.1 06/04/2016   PLT 268 06/04/2016   Lab Results  Component Value Date   CEA 3.1 02/18/2016     STUDIES: No results found.  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  PLAN:    1. 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 Proceed with cycle 5 of FOLFOX plus Avastin today. Plan to do a total of 12 cycles with reimaging after 6. CEA and CA-19-9 are within normal limits. Return to clinic in 2 days for pump removal and then in 2 weeks for consideration of cycle 6.  Of  note, patient became irritable and had difficulty sleeping secondary to dexamethasone premedication, therefore this was discontinued from his treatment regimen. 2. Pain: Continue oxycodone as needed. 3. Diarrhea: Continue Lomotil as needed.  4. Difficulty sleeping: Discontinue dexamethasone as above. Ambien has been discontinued and patient was given a prescription for Xanax. 5. Anxiety: Xanax as needed 3 times a day.   Patient expressed understanding and was in agreement with this plan. He also understands that He can call clinic at any time with any questions, concerns, or complaints.   Lloyd Huger, MD   06/04/2016 12:44 PM

## 2016-06-02 NOTE — Telephone Encounter (Signed)
Patient insists on speaking with Tillie Rung or [facility] regarding appts. Please call hm back [phone]

## 2016-06-04 ENCOUNTER — Inpatient Hospital Stay (HOSPITAL_BASED_OUTPATIENT_CLINIC_OR_DEPARTMENT_OTHER): Payer: Commercial Managed Care - PPO | Admitting: Oncology

## 2016-06-04 ENCOUNTER — Inpatient Hospital Stay: Payer: Commercial Managed Care - PPO

## 2016-06-04 VITALS — BP 150/89 | HR 89 | Temp 98.5°F | Resp 24

## 2016-06-04 VITALS — BP 128/86 | HR 88 | Temp 98.0°F | Wt 213.1 lb

## 2016-06-04 DIAGNOSIS — Z800 Family history of malignant neoplasm of digestive organs: Secondary | ICD-10-CM

## 2016-06-04 DIAGNOSIS — F419 Anxiety disorder, unspecified: Secondary | ICD-10-CM

## 2016-06-04 DIAGNOSIS — R0602 Shortness of breath: Secondary | ICD-10-CM

## 2016-06-04 DIAGNOSIS — C182 Malignant neoplasm of ascending colon: Secondary | ICD-10-CM

## 2016-06-04 DIAGNOSIS — C786 Secondary malignant neoplasm of retroperitoneum and peritoneum: Secondary | ICD-10-CM

## 2016-06-04 DIAGNOSIS — F329 Major depressive disorder, single episode, unspecified: Secondary | ICD-10-CM

## 2016-06-04 DIAGNOSIS — R630 Anorexia: Secondary | ICD-10-CM

## 2016-06-04 DIAGNOSIS — R5383 Other fatigue: Secondary | ICD-10-CM

## 2016-06-04 DIAGNOSIS — C801 Malignant (primary) neoplasm, unspecified: Secondary | ICD-10-CM

## 2016-06-04 DIAGNOSIS — R197 Diarrhea, unspecified: Secondary | ICD-10-CM

## 2016-06-04 DIAGNOSIS — Z79899 Other long term (current) drug therapy: Secondary | ICD-10-CM

## 2016-06-04 DIAGNOSIS — Z01812 Encounter for preprocedural laboratory examination: Secondary | ICD-10-CM

## 2016-06-04 DIAGNOSIS — C772 Secondary and unspecified malignant neoplasm of intra-abdominal lymph nodes: Principal | ICD-10-CM

## 2016-06-04 DIAGNOSIS — F17210 Nicotine dependence, cigarettes, uncomplicated: Secondary | ICD-10-CM

## 2016-06-04 DIAGNOSIS — R531 Weakness: Secondary | ICD-10-CM

## 2016-06-04 LAB — COMPREHENSIVE METABOLIC PANEL
ALT: 12 U/L — AB (ref 17–63)
AST: 17 U/L (ref 15–41)
Albumin: 3.5 g/dL (ref 3.5–5.0)
Alkaline Phosphatase: 95 U/L (ref 38–126)
Anion gap: 6 (ref 5–15)
BILIRUBIN TOTAL: 0.4 mg/dL (ref 0.3–1.2)
BUN: 13 mg/dL (ref 6–20)
CO2: 24 mmol/L (ref 22–32)
CREATININE: 0.93 mg/dL (ref 0.61–1.24)
Calcium: 8.5 mg/dL — ABNORMAL LOW (ref 8.9–10.3)
Chloride: 104 mmol/L (ref 101–111)
GFR calc Af Amer: >60 mL/min (ref >60)
Glucose, Bld: 92 mg/dL (ref 65–99)
Potassium: 4.4 mmol/L (ref 3.5–5.1)
Sodium: 134 mmol/L — ABNORMAL LOW (ref 135–145)
TOTAL PROTEIN: 7.7 g/dL (ref 6.5–8.1)

## 2016-06-04 LAB — CBC WITH DIFFERENTIAL/PLATELET
BASOS ABS: 0.1 10*3/uL (ref 0–0.1)
Basophils Relative: 1 %
Eosinophils Absolute: 0.2 10*3/uL (ref 0–0.7)
Eosinophils Relative: 2 %
HEMATOCRIT: 35.3 % — AB (ref 40.0–52.0)
Hemoglobin: 11.8 g/dL — ABNORMAL LOW (ref 13.0–18.0)
LYMPHS ABS: 2.1 10*3/uL (ref 1.0–3.6)
LYMPHS PCT: 20 %
MCH: 26.8 pg (ref 26.0–34.0)
MCHC: 33.4 g/dL (ref 32.0–36.0)
MCV: 80.1 fL (ref 80.0–100.0)
MONO ABS: 0.9 10*3/uL (ref 0.2–1.0)
Monocytes Relative: 9 %
NEUTROS ABS: 7.3 10*3/uL — AB (ref 1.4–6.5)
Neutrophils Relative %: 68 %
Platelets: 268 10*3/uL (ref 150–440)
RBC: 4.4 MIL/uL (ref 4.40–5.90)
RDW: 22.9 % — AB (ref 11.5–14.5)
WBC: 10.6 10*3/uL (ref 3.8–10.6)

## 2016-06-04 LAB — PROTEIN, URINE, RANDOM: Total Protein, Urine: 18 mg/dL

## 2016-06-04 MED ORDER — SODIUM CHLORIDE 0.9 % IV SOLN
2400.0000 mg/m2 | INTRAVENOUS | Status: DC
  Administered 2016-06-04: 5500 mg via INTRAVENOUS
  Filled 2016-06-04: qty 100

## 2016-06-04 MED ORDER — LEUCOVORIN CALCIUM INJECTION 350 MG
900.0000 mg | Freq: Once | INTRAVENOUS | Status: AC
  Administered 2016-06-04: 900 mg via INTRAVENOUS
  Filled 2016-06-04: qty 45

## 2016-06-04 MED ORDER — SODIUM CHLORIDE 0.9 % IV SOLN: 5.0000 mg/kg | Freq: Once | INTRAVENOUS | Status: DC

## 2016-06-04 MED ORDER — FLUOROURACIL CHEMO INJECTION 2.5 GM/50ML
400.0000 mg/m2 | Freq: Once | INTRAVENOUS | Status: AC
  Administered 2016-06-04: 900 mg via INTRAVENOUS
  Filled 2016-06-04: qty 18

## 2016-06-04 MED ORDER — PALONOSETRON HCL INJECTION 0.25 MG/5ML
0.2500 mg | Freq: Once | INTRAVENOUS | Status: AC
  Administered 2016-06-04: 0.25 mg via INTRAVENOUS
  Filled 2016-06-04: qty 5

## 2016-06-04 MED ORDER — SODIUM CHLORIDE 0.9 % IV SOLN
5.0000 mg/kg | Freq: Once | INTRAVENOUS | Status: AC
  Administered 2016-06-04: 475 mg via INTRAVENOUS
  Filled 2016-06-04: qty 16

## 2016-06-04 MED ORDER — DEXTROSE 5 % IV SOLN
85.0000 mg/m2 | Freq: Once | INTRAVENOUS | Status: AC
  Administered 2016-06-04: 195 mg via INTRAVENOUS
  Filled 2016-06-04: qty 39

## 2016-06-04 MED ORDER — SODIUM CHLORIDE 0.9% FLUSH
10.0000 mL | Freq: Once | INTRAVENOUS | Status: AC
  Administered 2016-06-04: 10 mL via INTRAVENOUS
  Filled 2016-06-04: qty 10

## 2016-06-04 MED ORDER — DEXTROSE 5 % IV SOLN
Freq: Once | INTRAVENOUS | Status: AC
Start: 2016-06-04 — End: 2016-06-04
  Administered 2016-06-04: 10:00:00 via INTRAVENOUS
  Filled 2016-06-04: qty 1000

## 2016-06-04 MED ORDER — OXYCODONE-ACETAMINOPHEN 5-325 MG PO TABS: 1.0000 | ORAL_TABLET | ORAL | 0 refills | Status: DC | PRN

## 2016-06-04 MED ORDER — SODIUM CHLORIDE 0.9% FLUSH
10.0000 mL | INTRAVENOUS | Status: DC | PRN
Start: 2016-06-04 — End: 2016-06-04
  Filled 2016-06-04: qty 10

## 2016-06-04 NOTE — Progress Notes (Signed)
States is feeling well. Offers no complaints. 

## 2016-06-04 NOTE — Progress Notes (Signed)
After completing Oxaliplatin and Leucovorin pt became SOB and reported that "my stomach is upset and I am nauseous". Per pt report he states that this happened after hi last tx as well. Dr Grayland Ormond informed and was instructed to monitor pt 10-15 min to see is s/s subside. Pt monitored and become more relaxed. Pt advised tht he had not eaten anything today, crackers and applesauce provided per request. Pt now stable, VSSAF, tx plan completed and pt left with mother at side. No other needs or concerns from pt and his mother.

## 2016-06-06 ENCOUNTER — Other Ambulatory Visit: Payer: Self-pay | Admitting: *Deleted

## 2016-06-06 ENCOUNTER — Inpatient Hospital Stay: Payer: Commercial Managed Care - PPO

## 2016-06-06 ENCOUNTER — Other Ambulatory Visit: Payer: Self-pay | Admitting: Oncology

## 2016-06-06 DIAGNOSIS — C182 Malignant neoplasm of ascending colon: Secondary | ICD-10-CM | POA: Diagnosis not present

## 2016-06-06 DIAGNOSIS — E860 Dehydration: Secondary | ICD-10-CM

## 2016-06-06 DIAGNOSIS — R509 Fever, unspecified: Secondary | ICD-10-CM

## 2016-06-06 LAB — URINALYSIS COMPLETE WITH MICROSCOPIC (ARMC ONLY)
Bacteria, UA: NONE SEEN
Bilirubin Urine: NEGATIVE
GLUCOSE, UA: NEGATIVE mg/dL
KETONES UR: NEGATIVE mg/dL
Leukocytes, UA: NEGATIVE
Nitrite: NEGATIVE
SPECIFIC GRAVITY, URINE: 1.02 (ref 1.005–1.030)
Squamous Epithelial / LPF: NONE SEEN
pH: 7 (ref 5.0–8.0)

## 2016-06-06 MED ORDER — SODIUM CHLORIDE 0.9% FLUSH
10.0000 mL | INTRAVENOUS | Status: DC | PRN
  Filled 2016-06-06: qty 10

## 2016-06-06 MED ORDER — SODIUM CHLORIDE 0.9 % IV SOLN
Freq: Once | INTRAVENOUS | Status: AC
  Administered 2016-06-06: 14:00:00 via INTRAVENOUS
  Filled 2016-06-06: qty 1000

## 2016-06-06 MED ORDER — SODIUM CHLORIDE 0.9 % IV SOLN
Freq: Once | INTRAVENOUS | Status: AC
  Filled 2016-06-06: qty 1000

## 2016-06-06 MED ORDER — HEPARIN SOD (PORK) LOCK FLUSH 100 UNIT/ML IV SOLN
INTRAVENOUS | Status: AC
  Filled 2016-06-06: qty 5

## 2016-06-06 MED ORDER — HEPARIN SOD (PORK) LOCK FLUSH 100 UNIT/ML IV SOLN
500.0000 [IU] | Freq: Once | INTRAVENOUS | Status: AC
  Administered 2016-06-06: 500 [IU] via INTRAVENOUS

## 2016-06-06 MED ORDER — ALPRAZOLAM 0.25 MG PO TABS: 0.2500 mg | ORAL_TABLET | Freq: Three times a day (TID) | ORAL | 0 refills | Status: DC | PRN

## 2016-06-08 LAB — URINE CULTURE: Culture: NO GROWTH

## 2016-06-10 ENCOUNTER — Emergency Department: Payer: Commercial Managed Care - PPO

## 2016-06-10 ENCOUNTER — Other Ambulatory Visit: Payer: Self-pay

## 2016-06-10 ENCOUNTER — Emergency Department
Admission: EM | Admit: 2016-06-10 | Discharge: 2016-06-10 | Disposition: A | Discharge status: Home / Self Care | Payer: Commercial Managed Care - PPO | Attending: Emergency Medicine | Admitting: Emergency Medicine

## 2016-06-10 DIAGNOSIS — G8929 Other chronic pain: Secondary | ICD-10-CM | POA: Diagnosis not present

## 2016-06-10 DIAGNOSIS — R0602 Shortness of breath: Secondary | ICD-10-CM | POA: Diagnosis present

## 2016-06-10 DIAGNOSIS — R062 Wheezing: Secondary | ICD-10-CM | POA: Insufficient documentation

## 2016-06-10 DIAGNOSIS — Z791 Long term (current) use of non-steroidal anti-inflammatories (NSAID): Secondary | ICD-10-CM | POA: Insufficient documentation

## 2016-06-10 DIAGNOSIS — Z85038 Personal history of other malignant neoplasm of large intestine: Secondary | ICD-10-CM | POA: Insufficient documentation

## 2016-06-10 DIAGNOSIS — R061 Stridor: Secondary | ICD-10-CM | POA: Diagnosis not present

## 2016-06-10 DIAGNOSIS — R1084 Generalized abdominal pain: Secondary | ICD-10-CM | POA: Insufficient documentation

## 2016-06-10 DIAGNOSIS — F17210 Nicotine dependence, cigarettes, uncomplicated: Secondary | ICD-10-CM | POA: Diagnosis not present

## 2016-06-10 DIAGNOSIS — R12 Heartburn: Secondary | ICD-10-CM | POA: Insufficient documentation

## 2016-06-10 LAB — CBC
HCT: 33.6 % — ABNORMAL LOW (ref 40.0–52.0)
HEMOGLOBIN: 11 g/dL — AB (ref 13.0–18.0)
MCH: 26.8 pg (ref 26.0–34.0)
MCHC: 32.8 g/dL (ref 32.0–36.0)
MCV: 81.6 fL (ref 80.0–100.0)
PLATELETS: 242 10*3/uL (ref 150–440)
RBC: 4.12 MIL/uL — AB (ref 4.40–5.90)
RDW: 21 % — ABNORMAL HIGH (ref 11.5–14.5)
WBC: 6.1 10*3/uL (ref 3.8–10.6)

## 2016-06-10 LAB — BASIC METABOLIC PANEL
ANION GAP: 8 (ref 5–15)
BUN: 10 mg/dL (ref 6–20)
CHLORIDE: 97 mmol/L — AB (ref 101–111)
CO2: 27 mmol/L (ref 22–32)
Calcium: 8.5 mg/dL — ABNORMAL LOW (ref 8.9–10.3)
Creatinine, Ser: 0.82 mg/dL (ref 0.61–1.24)
GFR calc non Af Amer: >60 mL/min (ref >60)
Glucose, Bld: 101 mg/dL — ABNORMAL HIGH (ref 65–99)
POTASSIUM: 3.7 mmol/L (ref 3.5–5.1)
SODIUM: 132 mmol/L — AB (ref 135–145)

## 2016-06-10 LAB — TROPONIN I

## 2016-06-10 MED ORDER — LORAZEPAM 2 MG/ML IJ SOLN
1.0000 mg | Freq: Once | INTRAMUSCULAR | Status: AC
  Administered 2016-06-10: 1 mg via INTRAVENOUS
  Filled 2016-06-10: qty 1

## 2016-06-10 MED ORDER — CHLORPROMAZINE HCL 50 MG PO TABS: 50.0000 mg | ORAL_TABLET | Freq: Three times a day (TID) | ORAL | 0 refills | Status: DC | PRN

## 2016-06-10 MED ORDER — IOPAMIDOL (ISOVUE-370) INJECTION 76%
75.0000 mL | Freq: Once | INTRAVENOUS | Status: AC | PRN
  Administered 2016-06-10: 75 mL via INTRAVENOUS

## 2016-06-10 MED ORDER — PANTOPRAZOLE SODIUM 20 MG PO TBEC: 20.0000 mg | DELAYED_RELEASE_TABLET | Freq: Every day | ORAL | 1 refills | Status: DC

## 2016-06-10 NOTE — ED Triage Notes (Addendum)
C/o SOB, heartburn and hiccups(4 days). 18G to L AC by [facility]. VSS with [facility]. Pt alert and oriented X4, active, cooperative, pt in NAD. RR even and unlabored, color WNL.

## 2016-06-10 NOTE — Discharge Instructions (Signed)
Please contact the oncologist in the morning to inform them of the emergency department visit

## 2016-06-10 NOTE — ED Notes (Signed)
No hiccups present at this time.

## 2016-06-10 NOTE — ED Notes (Signed)
Patient transported to CT 

## 2016-06-10 NOTE — ED Notes (Signed)
Pt appears anxious, sitting up on side of bed.

## 2016-06-10 NOTE — ED Provider Notes (Signed)
Time Seen: Approximately 1811  I have reviewed the triage notes  Chief Complaint: Shortness of Breath   History of Present Illness: Andrew Ibarra is a 50 y.o. male who presents with some intermittent hiccups along with heartburn and shortness of breath. Patient has a known history of colon cancer and peritoneal carcinomatosis. Patient is still undergoing chemotherapy and has a functioning right-sided port. He denies any fever today though states he did have a fever last week after his chemotherapy treatment. He is currently here with his parents and is followed locally by oncology.   Past Medical History:  Diagnosis Date  . Cancer [facility])    colon cancer  . Headache    H/O  . Loosening of tooth    top front  . Shortness of breath dyspnea    on exertion    Patient Active Problem List   Diagnosis Date Noted  . 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 Abnormal findings-gastrointestinal tract   . Neoplasm of digestive system   . Rectal polyp   . 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 COLONOSCOPY WITH PROPOFOL N/A 03/06/2016   Procedure: COLONOSCOPY WITH PROPOFOL;  Surgeon: Lucilla Lame, MD;  Location: [place];  Service: Endoscopy;  Laterality: N/A;  . POLYPECTOMY  03/06/2016   Procedure: POLYPECTOMY;  Surgeon: Lucilla Lame, MD;  Location: [place];  Service: Endoscopy;;  . PORTACATH PLACEMENT Right 03/25/2016   Procedure: INSERTION PORT-A-CATH;  Surgeon: [name], MD;  Location: [facility];  Service: General;  Laterality: Right;  . Right foot surgery  2001    Past Surgical History:  Procedure Laterality Date  . COLONOSCOPY WITH PROPOFOL N/A 03/06/2016   Procedure: COLONOSCOPY WITH PROPOFOL;  Surgeon: Lucilla Lame, MD;  Location: [place];  Service: Endoscopy;  Laterality: N/A;  . POLYPECTOMY  03/06/2016   Procedure: POLYPECTOMY;   Surgeon: Lucilla Lame, MD;  Location: [place];  Service: Endoscopy;;  . PORTACATH PLACEMENT Right 03/25/2016   Procedure: INSERTION PORT-A-CATH;  Surgeon: [name], MD;  Location: [facility];  Service: General;  Laterality: Right;  . Right foot surgery  2001    Current Outpatient Rx  . Order #: [id number] Class: Print  . Order #: [id number] Class: Print  . Order #: [id number] Class: Normal  . Order #: [id number] Class: Normal  . Order #: [id number] Class: Print  . Order #: [id number] Class: Print  . Order #: [id number] Class: Print  . Order #: [id number] Class: Historical Med    Allergies:  Review of patient's allergies indicates no known allergies.  Family History: Family History  Problem Relation Age of Onset  . Pancreatic cancer Maternal Grandfather   . Lymphoma Maternal Grandmother   . CAD Father   . Heart disease Father     Social History: Social History  Substance Use Topics  . Smoking status: Current Every Day Smoker    Packs/day: 0.50    Years: 35.00    Types: Cigarettes  . Smokeless tobacco: Never Used     Comment: has more than 30 pack years history  . Alcohol use No     Review of Systems:   10 point review of systems was performed and was otherwise negative:  Constitutional: No Current  fever Eyes: No visual disturbances ENT: No sore throat, ear pain Cardiac: No chest pain except with movement and hiccoughs Respiratory: Mild shortness of breath, wheezing, or stridor. No productive cough or  wheezing Abdomen: Chronic abdominal pain without recent exacerbation or change, no vomiting, No diarrhea Endocrine: No weight loss, No night sweats Extremities: No peripheral edema, cyanosis. No calf tenderness or swelling Skin: No rashes, easy bruising Neurologic: No focal weakness, trouble with speech or swollowing Urologic: No dysuria, Hematuria, or urinary frequency Patient describes some indigestion with his hiccups  Physical Exam:  ED Triage Vitals  Enc  Vitals Group     BP 06/10/16 1701 139/88     Pulse Rate 06/10/16 1701 (!) 102     Resp 06/10/16 1701 19     Temp 06/10/16 1701 98.4 F (36.9 C)     Temp Source 06/10/16 1701 Oral     SpO2 06/10/16 1701 100 %     Weight 06/10/16 1658 206 lb (93.4 kg)     Height 06/10/16 1658 5\' 9"  (1.753 m)     Head Circumference --      Peak Flow --      Pain Score 06/10/16 1658 6     Pain Loc --      Pain Edu? --      Excl. in [place]? --     General: Awake , Alert , and Oriented times 3; GCS 15, anxious with no signs of respiratory distress Head: Normal cephalic , atraumatic Eyes: Pupils equal , round, reactive to light Nose/Throat: No nasal drainage, patent upper airway without erythema or exudate.  Neck: Supple, Full range of motion, No anterior adenopathy or palpable thyroid masses Lungs: Clear to ascultation without wheezes , rhonchi, or rales Heart: Regular rate, regular rhythm without murmurs , gallops , or rubs Abdomen: Tender diffusely without rebound, guarding , or rigidity; bowel sounds positive and symmetric in all 4 quadrants. No organomegaly .        Extremities: 2 plus symmetric pulses. No edema, clubbing or cyanosis Neurologic: normal ambulation, Motor symmetric without deficits, sensory intact Skin: warm, dry, no rashes   Labs:   All laboratory work was reviewed including any pertinent negatives or positives listed below:  Labs Reviewed  BASIC METABOLIC PANEL - Abnormal; Notable for the following:       Result Value   Sodium 132 (*)    Chloride 97 (*)    Glucose, Bld 101 (*)    Calcium 8.5 (*)    All other components within normal limits  CBC - Abnormal; Notable for the following:    RBC 4.12 (*)    Hemoglobin 11.0 (*)    HCT 33.6 (*)    RDW 21.0 (*)    All other components within normal limits  TROPONIN I  Laboratory work was reviewed and showed no clinically significant abnormalities.   EKG: ED ECG REPORT I, Daymon Larsen, the attending physician, personally  viewed and interpreted this ECG.  Date: 06/10/2016 EKG Time: 1701 Rate: 102 Rhythm: Sinus tachycardia QRS Axis: normal Intervals: normal ST/T Wave abnormalities: normal Conduction Disturbances: none Narrative Interpretation: unremarkable No acute ischemic changes   Radiology:  "Dg Chest 2 View  Result Date: 06/10/2016 CLINICAL DATA:  Shortness of breath EXAM: CHEST  2 VIEW COMPARISON:  April 23, 2016 FINDINGS: Stable right Port-A-Cath. The heart, hila, mediastinum, lungs, and pleura are otherwise unchanged. IMPRESSION: No active cardiopulmonary disease. Electronically Signed   By: Dorise Bullion III M.D   On: 06/10/2016 18:46   Ct Angio Chest Pe W Or Wo Contrast  Result Date: 06/10/2016 CLINICAL DATA:  Shortness of breath, heartburn and hiccups EXAM: CT ANGIOGRAPHY CHEST WITH CONTRAST TECHNIQUE:  Multidetector CT imaging of the chest was performed using the standard protocol during bolus administration of intravenous contrast. Multiplanar CT image reconstructions and MIPs were obtained to evaluate the vascular anatomy. CONTRAST:  75 cc of Isovue 370 COMPARISON:  None. FINDINGS: Cardiovascular: Satisfactory opacification of the pulmonary arteries to the segmental level. No evidence of pulmonary embolism. Normal heart size. No pericardial effusion. Mediastinum/Nodes: Borderline enlarged left hilar node measures 1 cm, image 49 of series 4. No enlarged mediastinal lymph nodes. No axillary or supraclavicular adenopathy. Thyroid gland, trachea, and esophagus demonstrate no significant findings. Lungs/Pleura: No pleural effusion. No airspace consolidation identified. No suspicious pulmonary nodule or mass identified. Upper Abdomen: No acute findings identified within the upper abdomen. Again noted is evidence of extensive peritoneal carcinomatosis. Musculoskeletal: No aggressive lytic or sclerotic bone lesions. Review of the MIP images confirms the above findings. IMPRESSION: 1. No acute  cardiopulmonary abnormalities and no evidence for acute pulmonary embolus. 2. Evidence of extensive peritoneal carcinomatosis within the visualized portions of the upper abdomen. Electronically Signed   By: Kerby Moors M.D.   On: 06/10/2016 18:59  "  I personally reviewed the radiologic studies    ED Course:  Patient's stay here was uneventful and he had symptomatic improvement with some Ativan with a history of anxiety. He was worked up primarily for pulmonary embolism with his recent history of shortness of breath. His chest wall pain seems to be reproducible on palpation and also has no signs of pulmonary embolism per his CAT scan and I felt we adequately worked up any life-threatening cause for his presentation. His pulse ox remained stable. Clinical Course     Assessment:  Shortness of breath Hiccups   Final Clinical Impression:   Final diagnoses:  Shortness of breath  SOB (shortness of breath)     Plan:  Outpatient " New Prescriptions   CHLORPROMAZINE (THORAZINE) 50 MG TABLET    Take 1 tablet (50 mg total) by mouth 3 (three) times daily as needed for hiccoughs.   PANTOPRAZOLE (PROTONIX) 20 MG TABLET    Take 1 tablet (20 mg total) by mouth daily.  " Patient was advised to return immediately if condition worsens. Patient was advised to follow up with their primary care physician or other specialized physicians involved in their outpatient care. The patient and/or family member/power of attorney had laboratory results reviewed at the bedside. All questions and concerns were addressed and appropriate discharge instructions were distributed by the nursing staff.             Daymon Larsen, MD 06/10/16 2011

## 2016-06-10 NOTE — ED Notes (Signed)
Patient transported to X-ray 

## 2016-06-11 ENCOUNTER — Other Ambulatory Visit: Payer: Self-pay | Admitting: *Deleted

## 2016-06-11 LAB — CULTURE, BLOOD (ROUTINE X 2)
CULTURE: NO GROWTH
Culture: NO GROWTH

## 2016-06-11 NOTE — Telephone Encounter (Signed)
Too early for refill of Xanax, pharmacy notified to resend request on Monday

## 2016-06-12 ENCOUNTER — Other Ambulatory Visit: Payer: Self-pay | Admitting: Oncology

## 2016-06-12 ENCOUNTER — Other Ambulatory Visit: Payer: Self-pay | Admitting: *Deleted

## 2016-06-12 DIAGNOSIS — C801 Malignant (primary) neoplasm, unspecified: Principal | ICD-10-CM

## 2016-06-12 DIAGNOSIS — C786 Secondary malignant neoplasm of retroperitoneum and peritoneum: Secondary | ICD-10-CM

## 2016-06-12 DIAGNOSIS — F419 Anxiety disorder, unspecified: Secondary | ICD-10-CM

## 2016-06-12 MED ORDER — ALPRAZOLAM 0.5 MG PO TABS: 0.5000 mg | ORAL_TABLET | Freq: Three times a day (TID) | ORAL | 0 refills | Status: DC | PRN

## 2016-06-12 NOTE — Telephone Encounter (Signed)
Per Dr Grayland Ormond, dose increased to 0.5 mg TID and he wants a referral to Psychiatry made (states he will discuss with [facility])  I spoke with patient and informed him of dose increase and that he is NOT to take more that 1 tablet three times a day and informed him that if he runs out early again, we will not refill it for him. I advised him that Dr Grayland Ormond is ordering a psych consult for his anxiety, he stated " You mean I gotta see a shrink, there ain't nothing wrong with me." I explained that they are more qualified to treat his anxiety and to make sure he is on the right medicine for it. He begrudgingly agreed. Rx faxed to [facility] in [place] correct pharmacy per patient

## 2016-06-12 NOTE — Telephone Encounter (Signed)
Requesting refill on his Xanax, states he is completely out of med and that he has taken up to 4 tabs a day knowing that it is ordered TID. States he will take 2 at bedtime to help him rest. Asking for change in directions or increase in strength. He got # 30 tabs 06/06/16 Also wants to postpone his chemo a week due to weakness and not feeling well. I encouraged him to keep his appt for next week and to have his labs checked and if he does not feel he can tolerate tx it can be cancelled then and if needed he could get IVF instead. He was in agreement with this. Please advise regarding his Xanax

## 2016-06-16 NOTE — Progress Notes (Signed)
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   INTERVAL HISTORY: Patient returns to clinic today for further evaluation and reconsideration of cycle 6 of FOLFOX plus Avastin. He continues to be highly anxious, but has refused referral to psychiatry. He does not complain of weakness or fatigue today. He has a good appetite and has gained weight in the interim.  He has no neurologic complaints. He denies any fevers or illnesses. He denies any chest pain or shortness of breath. He denies any nausea, vomiting, constipation, or diarrhea.  He has no urinary complaints. Patient offers no further specific complaints today.  REVIEW OF SYSTEMS:   Review of Systems  Constitutional: Negative for fever, malaise/fatigue and weight loss.  Respiratory: Negative.  Negative for cough and shortness of breath.   Cardiovascular: Negative.  Negative for chest pain.  Gastrointestinal: Negative for abdominal pain, blood in stool, constipation, diarrhea, melena, nausea and vomiting.  Genitourinary: Negative.   Musculoskeletal: Negative.   Neurological: Negative for weakness.  Psychiatric/Behavioral: Negative for depression. The patient is nervous/anxious. The patient does not have insomnia.     As per HPI. Otherwise, a complete review of systems is negative.  PAST MEDICAL HISTORY: Past Medical History:  Diagnosis Date  . Cancer [facility])    colon cancer  . Headache    H/O  . Loosening of tooth    top front  . Shortness of breath dyspnea    on exertion    PAST SURGICAL HISTORY: Past Surgical History:    Procedure Laterality Date  . COLONOSCOPY WITH PROPOFOL N/A 03/06/2016   Procedure: COLONOSCOPY WITH PROPOFOL;  Surgeon: Lucilla Lame, MD;  Location: [place];  Service: Endoscopy;  Laterality: N/A;  . POLYPECTOMY  03/06/2016   Procedure: POLYPECTOMY;  Surgeon: Lucilla Lame, MD;  Location: [place];  Service: Endoscopy;;  . PORTACATH PLACEMENT Right 03/25/2016   Procedure: INSERTION PORT-A-CATH;  Surgeon: [name], MD;  Location: [facility];  Service: General;  Laterality: Right;  . Right foot surgery  2001    FAMILY HISTORY Family History  Problem Relation Age of Onset  . Pancreatic cancer Maternal Grandfather   . Lymphoma Maternal Grandmother   . CAD Father   . Heart disease Father        ADVANCED DIRECTIVES:    HEALTH MAINTENANCE: Social History  Substance Use Topics  . Smoking status: Current Every Day Smoker    Packs/day: 0.50    Years: 35.00    Types: Cigarettes  . Smokeless tobacco: Never Used     Comment: has more than 30 pack years history  . Alcohol use No     Colonoscopy:  PAP:  Bone density:  Lipid panel:  No Known Allergies  Current Outpatient Prescriptions  Medication Sig Dispense Refill  . ALPRAZolam (XANAX) 0.5 MG tablet Take 1 tablet (0.5 mg total) by mouth 3 (three) times daily as needed for anxiety. 45 tablet 0  . chlorproMAZINE (THORAZINE) 50 MG tablet Take 1 tablet (50 mg total) by mouth 3 (three) times daily as needed for hiccoughs. 30 tablet 0  . furosemide (LASIX) 20 MG tablet Take 1 tablet (20 mg total) by mouth  daily as needed for fluid or edema. (Patient taking differently: Take 20 mg by mouth daily as needed for fluid or edema. am) 30 tablet 0  . lidocaine-prilocaine (EMLA) cream Apply to affected area once 30 g 3  . morphine (MS CONTIN) 30 MG 12 hr tablet Take 1 tablet (30 mg total) by mouth every 12 (twelve) hours. 28 tablet 0  . oxyCODONE-acetaminophen (PERCOCET/ROXICET) 5-325 MG tablet Take 1 tablet by mouth every 4  (four) hours as needed for severe pain. 60 tablet 0  . pantoprazole (PROTONIX) 20 MG tablet Take 1 tablet (20 mg total) by mouth daily. 30 tablet 1  . polyethylene glycol (MIRALAX / GLYCOLAX) packet Take 17 g by mouth daily.    . ondansetron (ZOFRAN) 8 MG tablet Take 1 tablet (8 mg total) by mouth 2 (two) times daily as needed for refractory nausea / vomiting. 30 tablet 3  . prochlorperazine (COMPAZINE) 10 MG tablet Take 1 tablet (10 mg total) by mouth every 6 (six) hours as needed for nausea or vomiting. 30 tablet 3   No current facility-administered medications for this visit.    Facility-Administered Medications Ordered in Other Visits  Medication Dose Route Frequency Provider Last Rate Last Dose  . 0.9 %  sodium chloride infusion   Intravenous Once Lloyd Huger, MD      . fluorouracil (ADRUCIL) 5,500 mg in sodium chloride 0.9 % 140 mL chemo infusion  2,400 mg/m2 (Treatment Plan Recorded) Intravenous 1 day or 1 dose Lloyd Huger, MD   5,500 mg at 06/18/16 1457  . heparin lock flush 100 unit/mL  500 Units Intracatheter Once PRN Lloyd Huger, MD      . sodium chloride flush (NS) 0.9 % injection 10 mL  10 mL Intravenous PRN Lloyd Huger, MD   10 mL at 04/09/16 1014    OBJECTIVE: Vitals:   06/18/16 0956  BP: 127/80  Pulse: (!) 103  Resp: 18  Temp: (!) 95 F (35 C)     Body mass index is 30.72 kg/m.    ECOG FS:1 - Symptomatic but completely ambulatory  General: Well-developed, well-nourished, no acute distress. Eyes: Pink conjunctiva, anicteric sclera. Lungs: Clear to auscultation bilaterally. Heart: Regular rate and rhythm. No rubs, murmurs, or gallops. Abdomen: Mildly distended, nontender. Musculoskeletal: No edema, cyanosis, or clubbing. Neuro: Alert, answering all questions appropriately. Cranial nerves grossly intact. Skin: No rashes or petechiae noted. Psych: Normal affect.   LAB RESULTS:  Lab Results  Component Value Date   NA 136 06/18/2016   K  3.9 06/18/2016   CL 104 06/18/2016   CO2 25 06/18/2016   GLUCOSE 99 06/18/2016   BUN 10 06/18/2016   CREATININE 0.59 (L) 06/18/2016   CALCIUM 8.6 (L) 06/18/2016   PROT 7.1 06/18/2016   ALBUMIN 3.1 (L) 06/18/2016   AST 17 06/18/2016   ALT 17 06/18/2016   ALKPHOS 88 06/18/2016   BILITOT 0.5 06/18/2016   GFRNONAA >60 06/18/2016   GFRAA >60 06/18/2016    Lab Results  Component Value Date   WBC 3.8 06/18/2016   NEUTROABS 1.6 06/18/2016   HGB 10.4 (L) 06/18/2016   HCT 31.1 (L) 06/18/2016   MCV 80.9 06/18/2016   PLT 338 06/18/2016   Lab Results  Component Value Date   CEA 3.1 02/18/2016     STUDIES: Dg Chest 2 View  Result Date: 06/10/2016 CLINICAL DATA:  Shortness of breath EXAM: CHEST  2 VIEW COMPARISON:  April 23, 2016 FINDINGS: Stable right Port-A-Cath. The  heart, hila, mediastinum, lungs, and pleura are otherwise unchanged. IMPRESSION: No active cardiopulmonary disease. Electronically Signed   By: Dorise Bullion III M.D   On: 06/10/2016 18:46   Ct Angio Chest Pe W Or Wo Contrast  Result Date: 06/10/2016 CLINICAL DATA:  Shortness of breath, heartburn and hiccups EXAM: CT ANGIOGRAPHY CHEST WITH CONTRAST TECHNIQUE: Multidetector CT imaging of the chest was performed using the standard protocol during bolus administration of intravenous contrast. Multiplanar CT image reconstructions and MIPs were obtained to evaluate the vascular anatomy. CONTRAST:  75 cc of Isovue 370 COMPARISON:  None. FINDINGS: Cardiovascular: Satisfactory opacification of the pulmonary arteries to the segmental level. No evidence of pulmonary embolism. Normal heart size. No pericardial effusion. Mediastinum/Nodes: Borderline enlarged left hilar node measures 1 cm, image 49 of series 4. No enlarged mediastinal lymph nodes. No axillary or supraclavicular adenopathy. Thyroid gland, trachea, and esophagus demonstrate no significant findings. Lungs/Pleura: No pleural effusion. No airspace consolidation identified. No  suspicious pulmonary nodule or mass identified. Upper Abdomen: No acute findings identified within the upper abdomen. Again noted is evidence of extensive peritoneal carcinomatosis. Musculoskeletal: No aggressive lytic or sclerotic bone lesions. Review of the MIP images confirms the above findings. IMPRESSION: 1. No acute cardiopulmonary abnormalities and no evidence for acute pulmonary embolus. 2. Evidence of extensive peritoneal carcinomatosis within the visualized portions of the upper abdomen. Electronically Signed   By: Kerby Moors M.D.   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  PLAN:    1. 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 Proceed with cycle 6 of FOLFOX plus Avastin today. Plan to do a total of 12 cycles with reimaging after 6. CEA and CA-19-9 are within normal limits. Return to clinic in 2 days for pump removal. Patient has requested extra time off prior to cycle 7, therefore return to clinic in 4 weeks with repeat PET scan and consideration of cycle 7.  Of note, patient became irritable and had difficulty sleeping secondary to dexamethasone premedication, therefore this was discontinued from his treatment regimen. 2. Pain: Continue oxycodone as needed. 3. Diarrhea: Continue Lomotil as needed.  4. Difficulty sleeping: Discontinue dexamethasone as above. Ambien has been discontinued and patient was given a prescription for Xanax. 5. Anxiety: Xanax as needed 3 times a day. Patient has refused psychiatry referral for further management of his depression and anxiety.   Patient expressed understanding and was in agreement with this plan. He also understands that He can call clinic at any time with any questions, concerns, or complaints.     Lloyd Huger, MD   06/20/2016 8:43 AM

## 2016-06-18 ENCOUNTER — Inpatient Hospital Stay (HOSPITAL_BASED_OUTPATIENT_CLINIC_OR_DEPARTMENT_OTHER): Payer: Commercial Managed Care - PPO | Admitting: Oncology

## 2016-06-18 ENCOUNTER — Encounter (INDEPENDENT_AMBULATORY_CARE_PROVIDER_SITE_OTHER): Payer: Self-pay

## 2016-06-18 ENCOUNTER — Inpatient Hospital Stay: Payer: Commercial Managed Care - PPO | Attending: Oncology

## 2016-06-18 ENCOUNTER — Inpatient Hospital Stay: Payer: Commercial Managed Care - PPO

## 2016-06-18 VITALS — BP 127/80 | HR 103 | Temp 95.0°F | Resp 18 | Wt 208.0 lb

## 2016-06-18 VITALS — BP 127/79 | HR 94

## 2016-06-18 DIAGNOSIS — Z808 Family history of malignant neoplasm of other organs or systems: Secondary | ICD-10-CM | POA: Insufficient documentation

## 2016-06-18 DIAGNOSIS — F17210 Nicotine dependence, cigarettes, uncomplicated: Secondary | ICD-10-CM | POA: Insufficient documentation

## 2016-06-18 DIAGNOSIS — C182 Malignant neoplasm of ascending colon: Secondary | ICD-10-CM

## 2016-06-18 DIAGNOSIS — G4700 Insomnia, unspecified: Secondary | ICD-10-CM | POA: Diagnosis not present

## 2016-06-18 DIAGNOSIS — Z79899 Other long term (current) drug therapy: Secondary | ICD-10-CM | POA: Insufficient documentation

## 2016-06-18 DIAGNOSIS — R197 Diarrhea, unspecified: Secondary | ICD-10-CM

## 2016-06-18 DIAGNOSIS — C786 Secondary malignant neoplasm of retroperitoneum and peritoneum: Secondary | ICD-10-CM | POA: Insufficient documentation

## 2016-06-18 DIAGNOSIS — Z5111 Encounter for antineoplastic chemotherapy: Secondary | ICD-10-CM | POA: Diagnosis not present

## 2016-06-18 DIAGNOSIS — F419 Anxiety disorder, unspecified: Secondary | ICD-10-CM | POA: Insufficient documentation

## 2016-06-18 DIAGNOSIS — C772 Secondary and unspecified malignant neoplasm of intra-abdominal lymph nodes: Secondary | ICD-10-CM

## 2016-06-18 DIAGNOSIS — Z01812 Encounter for preprocedural laboratory examination: Secondary | ICD-10-CM

## 2016-06-18 DIAGNOSIS — C801 Malignant (primary) neoplasm, unspecified: Secondary | ICD-10-CM

## 2016-06-18 DIAGNOSIS — R0602 Shortness of breath: Secondary | ICD-10-CM

## 2016-06-18 LAB — COMPREHENSIVE METABOLIC PANEL
ALBUMIN: 3.1 g/dL — AB (ref 3.5–5.0)
ALK PHOS: 88 U/L (ref 38–126)
ALT: 17 U/L (ref 17–63)
AST: 17 U/L (ref 15–41)
Anion gap: 7 (ref 5–15)
BILIRUBIN TOTAL: 0.5 mg/dL (ref 0.3–1.2)
BUN: 10 mg/dL (ref 6–20)
CO2: 25 mmol/L (ref 22–32)
Calcium: 8.6 mg/dL — ABNORMAL LOW (ref 8.9–10.3)
Chloride: 104 mmol/L (ref 101–111)
Creatinine, Ser: 0.59 mg/dL — ABNORMAL LOW (ref 0.61–1.24)
GFR calc Af Amer: >60 mL/min (ref >60)
GFR calc non Af Amer: >60 mL/min (ref >60)
GLUCOSE: 99 mg/dL (ref 65–99)
POTASSIUM: 3.9 mmol/L (ref 3.5–5.1)
SODIUM: 136 mmol/L (ref 135–145)
TOTAL PROTEIN: 7.1 g/dL (ref 6.5–8.1)

## 2016-06-18 LAB — PROTEIN, URINE, RANDOM: Total Protein, Urine: 33 mg/dL

## 2016-06-18 LAB — CBC WITH DIFFERENTIAL/PLATELET
BASOS ABS: 0 10*3/uL (ref 0–0.1)
BASOS PCT: 1 %
EOS ABS: 0.1 10*3/uL (ref 0–0.7)
EOS PCT: 2 %
HEMATOCRIT: 31.1 % — AB (ref 40.0–52.0)
HEMOGLOBIN: 10.4 g/dL — AB (ref 13.0–18.0)
LYMPHS ABS: 1.5 10*3/uL (ref 1.0–3.6)
Lymphocytes Relative: 40 %
MCH: 27.1 pg (ref 26.0–34.0)
MCHC: 33.5 g/dL (ref 32.0–36.0)
MCV: 80.9 fL (ref 80.0–100.0)
MONOS PCT: 14 %
Monocytes Absolute: 0.5 10*3/uL (ref 0.2–1.0)
Neutro Abs: 1.6 10*3/uL (ref 1.4–6.5)
Neutrophils Relative %: 43 %
Platelets: 338 10*3/uL (ref 150–440)
RBC: 3.85 MIL/uL — AB (ref 4.40–5.90)
RDW: 20.7 % — AB (ref 11.5–14.5)
WBC: 3.8 10*3/uL (ref 3.8–10.6)

## 2016-06-18 MED ORDER — FLUOROURACIL CHEMO INJECTION 2.5 GM/50ML
400.0000 mg/m2 | Freq: Once | INTRAVENOUS | Status: AC
  Administered 2016-06-18: 900 mg via INTRAVENOUS
  Filled 2016-06-18: qty 18

## 2016-06-18 MED ORDER — HEPARIN SOD (PORK) LOCK FLUSH 100 UNIT/ML IV SOLN: 500.0000 [IU] | Freq: Once | INTRAVENOUS | Status: DC | PRN

## 2016-06-18 MED ORDER — PALONOSETRON HCL INJECTION 0.25 MG/5ML
0.2500 mg | Freq: Once | INTRAVENOUS | Status: AC
  Administered 2016-06-18: 0.25 mg via INTRAVENOUS
  Filled 2016-06-18: qty 5

## 2016-06-18 MED ORDER — OXYCODONE-ACETAMINOPHEN 5-325 MG PO TABS: 1.0000 | ORAL_TABLET | ORAL | 0 refills | Status: DC | PRN

## 2016-06-18 MED ORDER — LEUCOVORIN CALCIUM INJECTION 350 MG
900.0000 mg | Freq: Once | INTRAVENOUS | Status: AC
  Administered 2016-06-18: 900 mg via INTRAVENOUS
  Filled 2016-06-18: qty 25

## 2016-06-18 MED ORDER — SODIUM CHLORIDE 0.9 % IV SOLN
2400.0000 mg/m2 | INTRAVENOUS | Status: AC
  Administered 2016-06-18: 5500 mg via INTRAVENOUS
  Filled 2016-06-18: qty 100

## 2016-06-18 MED ORDER — SODIUM CHLORIDE 0.9 % IV SOLN
5.0000 mg/kg | Freq: Once | INTRAVENOUS | Status: AC
  Administered 2016-06-18: 475 mg via INTRAVENOUS
  Filled 2016-06-18: qty 16

## 2016-06-18 MED ORDER — SODIUM CHLORIDE 0.9 % IV SOLN
Freq: Once | INTRAVENOUS | Status: AC
  Administered 2016-06-18: 12:00:00 via INTRAVENOUS
  Filled 2016-06-18: qty 4

## 2016-06-18 MED ORDER — PROCHLORPERAZINE MALEATE 10 MG PO TABS: 10.0000 mg | ORAL_TABLET | Freq: Four times a day (QID) | ORAL | 3 refills | Status: DC | PRN

## 2016-06-18 MED ORDER — SODIUM CHLORIDE 0.9 % IV SOLN
Freq: Once | INTRAVENOUS | Status: AC
  Administered 2016-06-18: 11:00:00 via INTRAVENOUS
  Filled 2016-06-18: qty 1000

## 2016-06-18 MED ORDER — ONDANSETRON HCL 8 MG PO TABS: 8.0000 mg | ORAL_TABLET | Freq: Two times a day (BID) | ORAL | 3 refills | Status: AC | PRN

## 2016-06-18 MED ORDER — DEXTROSE 5 % IV SOLN
Freq: Once | INTRAVENOUS | Status: AC
  Administered 2016-06-18: 13:00:00 via INTRAVENOUS
  Filled 2016-06-18: qty 1000

## 2016-06-18 MED ORDER — OXALIPLATIN CHEMO INJECTION 100 MG/20ML
85.0000 mg/m2 | Freq: Once | INTRAVENOUS | Status: AC
  Administered 2016-06-18: 195 mg via INTRAVENOUS
  Filled 2016-06-18: qty 39

## 2016-06-18 NOTE — Progress Notes (Signed)
States after last treatment had fever, nausea, vomiting, dry heaves, hiccups. Received IVF with pump d/c which helped relieve his symptoms except for his hiccups. Went to urgent care the following Tuesday and received prescription for thorazine which relieved symptoms. Would like to discuss psychiatry referral with MD today.

## 2016-06-20 ENCOUNTER — Inpatient Hospital Stay: Payer: Commercial Managed Care - PPO

## 2016-06-20 VITALS — BP 110/78 | HR 106 | Temp 98.3°F | Resp 20

## 2016-06-20 DIAGNOSIS — C182 Malignant neoplasm of ascending colon: Secondary | ICD-10-CM | POA: Diagnosis not present

## 2016-06-20 MED ORDER — HEPARIN SOD (PORK) LOCK FLUSH 100 UNIT/ML IV SOLN
500.0000 [IU] | Freq: Once | INTRAVENOUS | Status: AC
  Administered 2016-06-20: 500 [IU] via INTRAVENOUS
  Filled 2016-06-20: qty 5

## 2016-06-20 MED ORDER — SODIUM CHLORIDE 0.9% FLUSH
10.0000 mL | INTRAVENOUS | Status: DC | PRN
  Administered 2016-06-20: 10 mL via INTRAVENOUS
  Filled 2016-06-20: qty 10

## 2016-07-13 ENCOUNTER — Inpatient Hospital Stay
Admission: EM | Admit: 2016-07-13 | Discharge: 2016-07-23 | DRG: 329 | Disposition: A | Discharge status: Home / Self Care | Payer: Commercial Managed Care - PPO | Attending: Surgery | Admitting: Surgery

## 2016-07-13 ENCOUNTER — Emergency Department: Payer: Commercial Managed Care - PPO

## 2016-07-13 ENCOUNTER — Inpatient Hospital Stay: Payer: Commercial Managed Care - PPO

## 2016-07-13 DIAGNOSIS — E860 Dehydration: Secondary | ICD-10-CM | POA: Diagnosis present

## 2016-07-13 DIAGNOSIS — Z7189 Other specified counseling: Secondary | ICD-10-CM

## 2016-07-13 DIAGNOSIS — E43 Unspecified severe protein-calorie malnutrition: Secondary | ICD-10-CM | POA: Diagnosis present

## 2016-07-13 DIAGNOSIS — K56600 Partial intestinal obstruction, unspecified as to cause: Secondary | ICD-10-CM | POA: Diagnosis not present

## 2016-07-13 DIAGNOSIS — E871 Hypo-osmolality and hyponatremia: Secondary | ICD-10-CM | POA: Diagnosis present

## 2016-07-13 DIAGNOSIS — J449 Chronic obstructive pulmonary disease, unspecified: Secondary | ICD-10-CM | POA: Diagnosis present

## 2016-07-13 DIAGNOSIS — Z66 Do not resuscitate: Secondary | ICD-10-CM | POA: Diagnosis present

## 2016-07-13 DIAGNOSIS — Z807 Family history of other malignant neoplasms of lymphoid, hematopoietic and related tissues: Secondary | ICD-10-CM | POA: Diagnosis not present

## 2016-07-13 DIAGNOSIS — R030 Elevated blood-pressure reading, without diagnosis of hypertension: Secondary | ICD-10-CM | POA: Diagnosis present

## 2016-07-13 DIAGNOSIS — F17210 Nicotine dependence, cigarettes, uncomplicated: Secondary | ICD-10-CM | POA: Diagnosis present

## 2016-07-13 DIAGNOSIS — M6208 Separation of muscle (nontraumatic), other site: Secondary | ICD-10-CM | POA: Insufficient documentation

## 2016-07-13 DIAGNOSIS — Z0189 Encounter for other specified special examinations: Secondary | ICD-10-CM

## 2016-07-13 DIAGNOSIS — Z79899 Other long term (current) drug therapy: Secondary | ICD-10-CM

## 2016-07-13 DIAGNOSIS — R630 Anorexia: Secondary | ICD-10-CM | POA: Diagnosis not present

## 2016-07-13 DIAGNOSIS — Z8249 Family history of ischemic heart disease and other diseases of the circulatory system: Secondary | ICD-10-CM | POA: Diagnosis not present

## 2016-07-13 DIAGNOSIS — K56609 Unspecified intestinal obstruction, unspecified as to partial versus complete obstruction: Secondary | ICD-10-CM | POA: Diagnosis not present

## 2016-07-13 DIAGNOSIS — Z515 Encounter for palliative care: Secondary | ICD-10-CM | POA: Diagnosis not present

## 2016-07-13 DIAGNOSIS — R180 Malignant ascites: Secondary | ICD-10-CM | POA: Diagnosis present

## 2016-07-13 DIAGNOSIS — Z6829 Body mass index (BMI) 29.0-29.9, adult: Secondary | ICD-10-CM | POA: Diagnosis not present

## 2016-07-13 DIAGNOSIS — F419 Anxiety disorder, unspecified: Secondary | ICD-10-CM | POA: Diagnosis present

## 2016-07-13 DIAGNOSIS — C189 Malignant neoplasm of colon, unspecified: Secondary | ICD-10-CM | POA: Diagnosis present

## 2016-07-13 DIAGNOSIS — C786 Secondary malignant neoplasm of retroperitoneum and peritoneum: Secondary | ICD-10-CM | POA: Diagnosis present

## 2016-07-13 DIAGNOSIS — Z800 Family history of malignant neoplasm of digestive organs: Secondary | ICD-10-CM

## 2016-07-13 DIAGNOSIS — R112 Nausea with vomiting, unspecified: Secondary | ICD-10-CM | POA: Diagnosis present

## 2016-07-13 LAB — COMPREHENSIVE METABOLIC PANEL
ALK PHOS: 94 U/L (ref 38–126)
ALT: 9 U/L — AB (ref 17–63)
AST: 20 U/L (ref 15–41)
Albumin: 3.8 g/dL (ref 3.5–5.0)
Anion gap: 9 (ref 5–15)
BUN: 16 mg/dL (ref 6–20)
CALCIUM: 9.2 mg/dL (ref 8.9–10.3)
CO2: 25 mmol/L (ref 22–32)
CREATININE: 0.77 mg/dL (ref 0.61–1.24)
Chloride: 99 mmol/L — ABNORMAL LOW (ref 101–111)
GFR calc non Af Amer: >60 mL/min (ref >60)
Glucose, Bld: 110 mg/dL — ABNORMAL HIGH (ref 65–99)
Potassium: 4.5 mmol/L (ref 3.5–5.1)
SODIUM: 133 mmol/L — AB (ref 135–145)
Total Bilirubin: 0.8 mg/dL (ref 0.3–1.2)
Total Protein: 8.4 g/dL — ABNORMAL HIGH (ref 6.5–8.1)

## 2016-07-13 LAB — CBC
HCT: 43.8 % (ref 40.0–52.0)
Hemoglobin: 14.6 g/dL (ref 13.0–18.0)
MCH: 28.1 pg (ref 26.0–34.0)
MCHC: 33.2 g/dL (ref 32.0–36.0)
MCV: 84.4 fL (ref 80.0–100.0)
PLATELETS: 251 10*3/uL (ref 150–440)
RBC: 5.18 MIL/uL (ref 4.40–5.90)
RDW: 20.2 % — AB (ref 11.5–14.5)
WBC: 10 10*3/uL (ref 3.8–10.6)

## 2016-07-13 LAB — LIPASE, BLOOD: Lipase: 12 U/L (ref 11–51)

## 2016-07-13 MED ORDER — ONDANSETRON HCL 4 MG/2ML IJ SOLN
4.0000 mg | Freq: Once | INTRAMUSCULAR | Status: AC | PRN
  Administered 2016-07-13: 4 mg via INTRAVENOUS
  Filled 2016-07-13: qty 2

## 2016-07-13 MED ORDER — MORPHINE SULFATE (PF) 2 MG/ML IV SOLN
INTRAVENOUS | Status: AC
  Administered 2016-07-13: 4 mg via INTRAVENOUS
  Filled 2016-07-13: qty 1

## 2016-07-13 MED ORDER — ONDANSETRON HCL 4 MG/2ML IJ SOLN
4.0000 mg | Freq: Once | INTRAMUSCULAR | Status: AC
  Administered 2016-07-13: 4 mg via INTRAVENOUS

## 2016-07-13 MED ORDER — SODIUM CHLORIDE 0.9 % IV BOLUS (SEPSIS)
1000.0000 mL | Freq: Once | INTRAVENOUS | Status: AC
  Administered 2016-07-13: 1000 mL via INTRAVENOUS

## 2016-07-13 MED ORDER — FAMOTIDINE IN NACL 20-0.9 MG/50ML-% IV SOLN
20.0000 mg | Freq: Two times a day (BID) | INTRAVENOUS | Status: DC
  Administered 2016-07-13 – 2016-07-15 (×5): 20 mg via INTRAVENOUS
  Filled 2016-07-13 (×8): qty 50

## 2016-07-13 MED ORDER — ACETAMINOPHEN 650 MG RE SUPP: 650.0000 mg | RECTAL | Status: DC | PRN

## 2016-07-13 MED ORDER — MORPHINE SULFATE (PF) 2 MG/ML IV SOLN
4.0000 mg | Freq: Once | INTRAVENOUS | Status: AC
  Administered 2016-07-13: 4 mg via INTRAVENOUS

## 2016-07-13 MED ORDER — IOPAMIDOL (ISOVUE-300) INJECTION 61%
100.0000 mL | Freq: Once | INTRAVENOUS | Status: AC | PRN
  Administered 2016-07-13: 100 mL via INTRAVENOUS

## 2016-07-13 MED ORDER — LORAZEPAM 2 MG/ML IJ SOLN
1.0000 mg | Freq: Once | INTRAMUSCULAR | Status: AC
  Administered 2016-07-13: 1 mg via INTRAVENOUS
  Filled 2016-07-13: qty 1

## 2016-07-13 MED ORDER — LACTATED RINGERS IV SOLN
INTRAVENOUS | Status: DC
  Administered 2016-07-13 – 2016-07-19 (×13): via INTRAVENOUS

## 2016-07-13 MED ORDER — MORPHINE SULFATE (PF) 2 MG/ML IV SOLN
INTRAVENOUS | Status: AC
  Filled 2016-07-13: qty 1

## 2016-07-13 MED ORDER — ONDANSETRON HCL 4 MG PO TABS: 4.0000 mg | ORAL_TABLET | Freq: Four times a day (QID) | ORAL | Status: DC | PRN

## 2016-07-13 MED ORDER — SODIUM CHLORIDE 0.9% FLUSH
3.0000 mL | Freq: Two times a day (BID) | INTRAVENOUS | Status: DC
  Administered 2016-07-14 – 2016-07-21 (×10): 3 mL via INTRAVENOUS

## 2016-07-13 MED ORDER — ONDANSETRON HCL 4 MG/2ML IJ SOLN
INTRAMUSCULAR | Status: AC
  Administered 2016-07-13: 4 mg via INTRAVENOUS
  Filled 2016-07-13: qty 2

## 2016-07-13 MED ORDER — ALBUTEROL SULFATE (2.5 MG/3ML) 0.083% IN NEBU
2.5000 mg | INHALATION_SOLUTION | RESPIRATORY_TRACT | Status: DC | PRN
Start: 2016-07-13 — End: 2016-07-23

## 2016-07-13 MED ORDER — IOPAMIDOL (ISOVUE-300) INJECTION 61%
30.0000 mL | Freq: Once | INTRAVENOUS | Status: AC | PRN
  Administered 2016-07-13: 30 mL via ORAL

## 2016-07-13 MED ORDER — ONDANSETRON HCL 4 MG/2ML IJ SOLN
4.0000 mg | Freq: Four times a day (QID) | INTRAMUSCULAR | Status: DC | PRN
  Administered 2016-07-16: 4 mg via INTRAVENOUS

## 2016-07-13 MED ORDER — MORPHINE SULFATE (PF) 2 MG/ML IV SOLN
2.0000 mg | INTRAVENOUS | Status: DC | PRN
  Administered 2016-07-13 – 2016-07-14 (×8): 2 mg via INTRAVENOUS
  Filled 2016-07-13 (×8): qty 1

## 2016-07-13 MED ORDER — ENOXAPARIN SODIUM 40 MG/0.4ML ~~LOC~~ SOLN
40.0000 mg | SUBCUTANEOUS | Status: DC
  Administered 2016-07-13 – 2016-07-21 (×8): 40 mg via SUBCUTANEOUS
  Filled 2016-07-13 (×9): qty 0.4

## 2016-07-13 MED ORDER — HYDRALAZINE HCL 20 MG/ML IJ SOLN: 10.0000 mg | Freq: Four times a day (QID) | INTRAMUSCULAR | Status: DC | PRN

## 2016-07-13 MED ORDER — MORPHINE SULFATE (PF) 2 MG/ML IV SOLN
4.0000 mg | Freq: Once | INTRAVENOUS | Status: AC
  Administered 2016-07-13: 4 mg via INTRAVENOUS
  Filled 2016-07-13: qty 2

## 2016-07-13 MED ORDER — BISACODYL 10 MG RE SUPP
10.0000 mg | Freq: Every day | RECTAL | Status: DC | PRN
  Filled 2016-07-13: qty 1

## 2016-07-13 NOTE — ED Provider Notes (Signed)
[facility]  I accepted care from Dr. Owens Shark ____________________________________________    LABS (pertinent positives/negatives)  Labs Reviewed  COMPREHENSIVE METABOLIC PANEL - Abnormal; Notable for the following:       Result Value   Sodium 133 (*)    Chloride 99 (*)    Glucose, Bld 110 (*)    Total Protein 8.4 (*)    ALT 9 (*)    All other components within normal limits  CBC - Abnormal; Notable for the following:    RDW 20.2 (*)    All other components within normal limits  LIPASE, BLOOD     ____________________________________________    RADIOLOGY All xrays were viewed by me. Imaging interpreted by radiologist.  CT abdomen and pelvis with contrast: IMPRESSION: 1. Partial small bowel obstruction. This could be due to an obstructing peritoneal metastasis or adhesion. 2. Significant increase in size of a colon mass at the hepatic flexure, causing partial obstruction of the right colon. This is compatible with the patient's known primary colon carcinoma. 3. Progressive peritoneal metastatic disease. 4. Decreased malignant ascites. 5. Concentric wall thickening involving the distal rectum. This could be due to incomplete distention of the rectum or interval proctitis. A rectal mass is also a possibility. 6. Aortic atherosclerosis.  ____________________________________________   PROCEDURES  Procedure(s) performed: None  Critical Care performed: None  ____________________________________________   INITIAL IMPRESSION / ASSESSMENT AND PLAN / ED COURSE   Pertinent labs & imaging results that were available during my care of the patient were reviewed by me and considered in my medical decision making (see chart for details).  I updated Andrew Ibarra and his wife regarding the CT scan showing only the cancer tumors, but partial small bowel obstruction. Patient did not want an NG tube. He is still having pain, was given additional IV pain  medication. Patient is not cooperative with partial small bowel obstruction, no indication for emergency consultation with surgery at this point.   I will discuss the case with medicine for hospitalist admission.  CONSULTATIONS: Hospitalist for admission.    Patient / Family / Caregiver informed of clinical course, medical decision-making process, and agree with plan.     ____________________________________________   FINAL CLINICAL IMPRESSION(S) / ED DIAGNOSES  Final diagnoses:  Partial small bowel obstruction        Lisa Roca, MD 07/13/16 [phone]

## 2016-07-13 NOTE — ED Notes (Signed)
Pt hx of colon and stomach cancer. Pt's last chemo treatment was 1 month ago.   Pt c/o N/V, dry heaves, abdominal pain, and anorexia. Pt's last BM was Tuesday. Pt reports his last meal was Wednesday - pt tried to eat sweet potato yesterday and only tolerated 3-4 bites.

## 2016-07-13 NOTE — ED Triage Notes (Signed)
Patient reports nausea and vomiting since Wednesday.  States received chemo approximately a month ago.

## 2016-07-13 NOTE — Progress Notes (Signed)
Advance care planning  Discussed with patient regarding goals of care. He understands he has incurable stage IV cancer. He was diagnosed with cancer recently and has undergone 6 rounds of chemotherapy. He is due for a PET scan tomorrow and is hoping this will show significant improvement in his cancer. He continues to be in good functional status and is hoping to be the same in the future. Lives at home with his wife.  Understands incurable nature but remains optimistic.  On discussing regarding CODE STATUS patient does not wish to be on prolonged life sustaining therapy. Initially he thought he would not want to be placed on a ventilator or be resuscitated. But after discussing with his wife he wants to be a full code. He knows further prognosis regarding his cancer with a PET scan and response to chemotherapy.  Time spent 20 minutes.

## 2016-07-13 NOTE — H&P (Signed)
[facility] [place] NAME: Foley Arwood    MR#:  [id number]  DATE OF BIRTH:  Aug 25, 1966  DATE OF ADMISSION:  07/13/2016  PRIMARY CARE PHYSICIAN: Nathaneil Canary, PA-C   REQUESTING/REFERRING PHYSICIAN: Dr. Reita Cliche  CHIEF COMPLAINT:   Chief Complaint  Patient presents with  . Nausea  . Emesis    HISTORY OF PRESENT ILLNESS:  Rudolphe Testerman  is a 50 y.o. 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 He has not been able to keep any fluids down. His last bowel movement was 5 days back. Passing gas. Patient CT scan showed partial small bowel obstruction. He refused NG tube.  He is supposed to get a PET scan tomorrow after 6 cycles of chemotherapy. Follows with Dr. Grayland Ormond at cancer center.  PAST MEDICAL HISTORY:   Past Medical History:  Diagnosis Date  . Cancer [facility])    colon cancer  . Headache    H/O  . Loosening of tooth    top front  . Shortness of breath dyspnea    on exertion    PAST SURGICAL HISTORY:   Past Surgical History:  Procedure Laterality Date  . COLONOSCOPY WITH PROPOFOL N/A 03/06/2016   Procedure: COLONOSCOPY WITH PROPOFOL;  Surgeon: Lucilla Lame, MD;  Location: [facility];  Service: Endoscopy;  Laterality: N/A;  . POLYPECTOMY  03/06/2016   Procedure: POLYPECTOMY;  Surgeon: Lucilla Lame, MD;  Location: [place];  Service: Endoscopy;;  . PORTACATH PLACEMENT Right 03/25/2016   Procedure: INSERTION PORT-A-CATH;  Surgeon: [name], MD;  Location: [facility];  Service: General;  Laterality: Right;  . Right foot surgery  2001    SOCIAL HISTORY:   Social History  Substance Use Topics  . Smoking status: Current Every Day Smoker    Packs/day: 0.50    Years: 35.00    Types: Cigarettes  . Smokeless tobacco: Never Used     Comment: has  more than 30 pack years history  . Alcohol use No    FAMILY HISTORY:   Family History  Problem Relation Age of Onset  . Pancreatic cancer Maternal Grandfather   . Lymphoma Maternal Grandmother   . CAD Father   . Heart disease Father     DRUG ALLERGIES:  No Known Allergies  REVIEW OF SYSTEMS:   ROS  MEDICATIONS AT HOME:   Prior to Admission medications   Medication Sig Start Date End Date Taking? Authorizing Provider  ALPRAZolam Duanne Moron) 0.5 MG tablet Take 1 tablet (0.5 mg total) by mouth 3 (three) times daily as needed for anxiety. 06/12/16  Yes Lloyd Huger, MD  chlorproMAZINE (THORAZINE) 50 MG tablet Take 1 tablet (50 mg total) by mouth 3 (three) times daily as needed for hiccoughs. 06/10/16  Yes Daymon Larsen, MD  ondansetron (ZOFRAN) 8 MG tablet Take 1 tablet (8 mg total) by mouth 2 (two) times daily as needed for refractory nausea / vomiting. 06/18/16  Yes Lloyd Huger, MD  oxyCODONE-acetaminophen (PERCOCET/ROXICET) 5-325 MG tablet Take 1 tablet by mouth every 4 (four) hours as needed for severe pain. 06/18/16  Yes Lloyd Huger, MD     VITAL SIGNS:  Blood pressure (!) 159/102, pulse 79, temperature 97.9 F (36.6 C), temperature source Oral, resp. rate 20, height 5\' 9"  (1.753 m), weight 94.8 kg (209 lb), SpO2  96 %.  PHYSICAL EXAMINATION:  Physical Exam  GENERAL:  50 y.o.-year-old patient lying in the bed with no acute distress.  EYES: Pupils equal, round, reactive to light and accommodation. No scleral icterus. Extraocular muscles intact.  HEENT: Head atraumatic, normocephalic. Oropharynx and nasopharynx clear. No oropharyngeal erythema, moist oral mucosa  NECK:  Supple, no jugular venous distention. No thyroid enlargement, no tenderness.  LUNGS: Normal breath sounds bilaterally, no wheezing, rales, rhonchi. No use of accessory muscles of respiration.  CARDIOVASCULAR: S1, S2 normal. No murmurs, rubs, or gallops.  ABDOMEN: Soft, distended. Bowel sounds  Increased. No organomegaly or mass. Diffuse tenderness EXTREMITIES: No pedal edema, cyanosis, or clubbing. + 2 pedal & radial pulses b/l.   NEUROLOGIC: Cranial nerves II through XII are intact. No focal Motor or sensory deficits appreciated b/l PSYCHIATRIC: The patient is alert and oriented x 3. Good affect.  SKIN: No obvious rash, lesion, or ulcer.   LABORATORY PANEL:   CBC  Recent Labs Lab 07/13/16 0316  WBC 10.0  HGB 14.6  HCT 43.8  PLT 251   ------------------------------------------------------------------------------------------------------------------  Chemistries   Recent Labs Lab 07/13/16 0316  NA 133*  K 4.5  CL 99*  CO2 25  GLUCOSE 110*  BUN 16  CREATININE 0.77  CALCIUM 9.2  AST 20  ALT 9*  ALKPHOS 94  BILITOT 0.8   ------------------------------------------------------------------------------------------------------------------  Cardiac Enzymes No results for input(s): TROPONINI in the last 168 hours. ------------------------------------------------------------------------------------------------------------------  RADIOLOGY:  Ct Abdomen Pelvis W Contrast  Result Date: 07/13/2016 CLINICAL DATA:  Abdomen and pelvis pain and distension for the past 3 days. Nausea and vomiting during that time. Stage IV colon cancer. EXAM: CT ABDOMEN AND PELVIS WITH CONTRAST TECHNIQUE: Multidetector CT imaging of the abdomen and pelvis was performed using the standard protocol following bolus administration of intravenous contrast. CONTRAST:  128mL ISOVUE-300 IOPAMIDOL (ISOVUE-300) INJECTION 61% COMPARISON:  Chest and abdomen radiographs dated 04/23/2016. PET-CT dated 03/04/2016 and abdomen and pelvis CT dated 02/18/2016. FINDINGS: Lower chest: Mild bibasilar linear atelectasis. Hepatobiliary: Interval peritoneal masses and associated fluid indenting into the dome of the liver. No intrahepatic masses are seen. Unremarkable gallbladder. Pancreas: Unremarkable. No pancreatic  ductal dilatation or surrounding inflammatory changes. Spleen: Normal in size without focal abnormality. Adrenals/Urinary Tract: Adrenal glands are unremarkable. Kidneys are normal, without renal calculi, focal lesion, or hydronephrosis. Bladder is unremarkable. Stomach/Bowel: Interval multiple dilated proximal small bowel loops with normal caliber distal loops. The right colon is also dilated. There is an hepatic flexure colon mass measuring 10.4 cm in length and 2.2 cm in maximum thickness. The stomach is dilated. The mid and distal colon are normal in caliber. There is concentric wall thickening involving the distal rectum with a maximum thickness of 1.2 cm, not present on 02/18/2016 where there was better distention of the rectum. No evidence of appendicitis. Vascular/Lymphatic: Atheromatous arterial calcifications, including mild calcifications in the abdominal aorta. Duplicated inferior vena cava multiple mesenteric masses with progression. Some of these could represent mesenteric nodes. Reproductive: Normal sized prostate gland. Other: Small amount of free peritoneal fluid with improvement. Small bilateral inguinal hernias containing fat. Musculoskeletal: Lumbar and thoracic spine degenerative changes. No evidence of bony metastatic disease. IMPRESSION: 1. Partial small bowel obstruction. This could be due to an obstructing peritoneal metastasis or adhesion. 2. Significant increase in size of a colon mass at the hepatic flexure, causing partial obstruction of the right colon. This is compatible with the patient's known primary colon carcinoma. 3. Progressive peritoneal metastatic disease. 4. Decreased malignant  ascites. 5. Concentric wall thickening involving the distal rectum. This could be due to incomplete distention of the rectum or interval proctitis. A rectal mass is also a possibility. 6. Aortic atherosclerosis. Electronically Signed   By: Claudie Revering M.D.   On: 07/13/2016 07:57     IMPRESSION AND  PLAN:   * Partial small bowel obstruction This is likely due to his peritoneal carcinomatosis. Admit patient onto the medical floor. Place NG tube. Nothing by mouth. IV fluids. Prescription for Gen. Surgery. Poor candidate for surgery.  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 This needs to be rescheduled. On chemotherapy at [place]. Follows with Dr. Grayland Ormond.  * Dehydration due to vomiting. start IV fluids.  * Hypovolemic hyponatremia Should improve with IV fluids.  * Elevated blood pressure without diagnosis of hypertension Likely due to abdominal pain. Monitor. Start medications if consistently elevated.  * DVT prophylaxis with Lovenox  All the records are reviewed and case discussed with ED provider. Management plans discussed with the patient, family and they are in agreement.  CODE STATUS: FULL CODE  TOTAL TIME TAKING CARE OF THIS PATIENT: 40 minutes.   Hillary Bow R M.D on 07/13/2016 at 10:09 AM  Between 7am to 6pm - Pager - [phone]  After 6pm go to [email or web address] - password [email or web address] [facility]  Office  [phone]  CC: Primary care physician; Nathaneil Canary, PA-C  Note: This dictation was prepared with Dragon dictation along with smaller phrase technology. Any transcriptional errors that result from this process are unintentional.

## 2016-07-13 NOTE — Consult Note (Signed)
Date of Consultation:  07/13/2016  Requesting Physician:  Lisa Roca, MD  Reason for Consultation:  Small bowel obstruction  History of Present Illness: Andrew Ibarra is a 50 y.o. male who presents with a 4-day history of upper abdominal pain, nausea, vomiting, and inability to tolerate much po intake.  Describes the pain as somewhat diffuse over the upper abdomen which has not been improving.  He has a hx of stage IV colon cancer and is currently on chemotherapy, followed by Dr. Grayland Ormond.  Last BM was 5 days ago and he typically has a daily bowel movement with occasional diarrhea associated with the chemotherapy.  Denies any fevers, chills, chest pain, shortness of breath, dysuria, hematuria, or blood in the stools.   Past Medical History: Past Medical History:  Diagnosis Date  . Cancer [facility])    colon cancer  . Headache    H/O  . Loosening of tooth    top front  . Shortness of breath dyspnea    on exertion     Past Surgical History: Past Surgical History:  Procedure Laterality Date  . COLONOSCOPY WITH PROPOFOL N/A 03/06/2016   Procedure: COLONOSCOPY WITH PROPOFOL;  Surgeon: Lucilla Lame, MD;  Location: [place];  Service: Endoscopy;  Laterality: N/A;  . POLYPECTOMY  03/06/2016   Procedure: POLYPECTOMY;  Surgeon: Lucilla Lame, MD;  Location: [place];  Service: Endoscopy;;  . PORTACATH PLACEMENT Right 03/25/2016   Procedure: INSERTION PORT-A-CATH;  Surgeon: [name], MD;  Location: [facility];  Service: General;  Laterality: Right;  . Right foot surgery  2001    Home Medications: Prior to Admission medications   Medication Sig Start Date End Date Taking? Authorizing Provider  ALPRAZolam Duanne Moron) 0.5 MG tablet Take 1 tablet (0.5 mg total) by mouth 3 (three) times daily as needed for anxiety. 06/12/16  Yes Lloyd Huger, MD  chlorproMAZINE (THORAZINE) 50 MG tablet Take 1 tablet (50 mg total) by mouth 3 (three) times daily as needed for hiccoughs.  06/10/16  Yes Daymon Larsen, MD  ondansetron (ZOFRAN) 8 MG tablet Take 1 tablet (8 mg total) by mouth 2 (two) times daily as needed for refractory nausea / vomiting. 06/18/16  Yes Lloyd Huger, MD  oxyCODONE-acetaminophen (PERCOCET/ROXICET) 5-325 MG tablet Take 1 tablet by mouth every 4 (four) hours as needed for severe pain. 06/18/16  Yes Lloyd Huger, MD    Allergies: No Known Allergies  Social History:  reports that he has been smoking Cigarettes.  He has a 17.50 pack-year smoking history. He has never used smokeless tobacco. He reports that he does not drink alcohol or use drugs.   Family History: Family History  Problem Relation Age of Onset  . Pancreatic cancer Maternal Grandfather   . Lymphoma Maternal Grandmother   . CAD Father   . Heart disease Father     Review of Systems: Review of Systems  Constitutional: Negative for chills and fever.  Eyes: Negative for blurred vision.  Respiratory: Negative for cough and shortness of breath.   Cardiovascular: Negative for chest pain and leg swelling.  Gastrointestinal: Positive for abdominal pain, constipation, nausea and vomiting. Negative for blood in stool, diarrhea and heartburn.  Genitourinary: Negative for dysuria and hematuria.  Musculoskeletal: Negative for myalgias.  Skin: Negative for rash.  Neurological: Negative for dizziness and headaches.  Psychiatric/Behavioral: Negative for depression.    Physical Exam BP (!) 159/102   Pulse 79   Temp 97.9 F (36.6 C) (Oral)  Resp 20   Ht 5\' 9"  (1.753 m)   Wt 94.8 kg (209 lb)   SpO2 96%   BMI 30.86 kg/m  CONSTITUTIONAL: No acute distress. HEENT:  Normocephalic, atraumatic, extraocular motion intact. NECK: Trachea is midline, and there is no jugular venous distension.  RESPIRATORY:  Lungs are clear, and breath sounds are equal bilaterally. Normal respiratory effort without pathologic use of accessory muscles. CARDIOVASCULAR: Heart is regular without murmurs,  gallops, or rubs. GI: The abdomen is soft, with some distention and tenderness over the upper abdomen. No peritonitis. There were no palpable masses. Patient has diastasis recti but no hernias. MUSCULOSKELETAL:  Normal muscle strength and tone in all four extremities.  No peripheral edema or cyanosis. SKIN: Skin turgor is normal. There are no pathologic skin lesions. Patient has right upper chest port-a-cath in placed with scar well healed. NEUROLOGIC:  Motor and sensation is grossly normal.  Cranial nerves are grossly intact. PSYCH:  Alert and oriented to person, place and time. Affect is normal.  Laboratory Analysis: Results for orders placed or performed during the hospital encounter of 07/13/16 (from the past 24 hour(s))  Lipase, blood     Status: None   Collection Time: 07/13/16  3:16 AM  Result Value Ref Range   Lipase 12 11 - 51 U/L  Comprehensive metabolic panel     Status: Abnormal   Collection Time: 07/13/16  3:16 AM  Result Value Ref Range   Sodium 133 (L) 135 - 145 mmol/L   Potassium 4.5 3.5 - 5.1 mmol/L   Chloride 99 (L) 101 - 111 mmol/L   CO2 25 22 - 32 mmol/L   Glucose, Bld 110 (H) 65 - 99 mg/dL   BUN 16 6 - 20 mg/dL   Creatinine, Ser 0.77 0.61 - 1.24 mg/dL   Calcium 9.2 8.9 - 10.3 mg/dL   Total Protein 8.4 (H) 6.5 - 8.1 g/dL   Albumin 3.8 3.5 - 5.0 g/dL   AST 20 15 - 41 U/L   ALT 9 (L) 17 - 63 U/L   Alkaline Phosphatase 94 38 - 126 U/L   Total Bilirubin 0.8 0.3 - 1.2 mg/dL   GFR calc non Af Amer >60 >60 mL/min   GFR calc Af Amer >60 >60 mL/min   Anion gap 9 5 - 15  CBC     Status: Abnormal   Collection Time: 07/13/16  3:16 AM  Result Value Ref Range   WBC 10.0 3.8 - 10.6 K/uL   RBC 5.18 4.40 - 5.90 MIL/uL   Hemoglobin 14.6 13.0 - 18.0 g/dL   HCT 43.8 40.0 - 52.0 %   MCV 84.4 80.0 - 100.0 fL   MCH 28.1 26.0 - 34.0 pg   MCHC 33.2 32.0 - 36.0 g/dL   RDW 20.2 (H) 11.5 - 14.5 %   Platelets 251 150 - 440 K/uL    Imaging: Ct Abdomen Pelvis W Contrast  Result  Date: 07/13/2016 CLINICAL DATA:  Abdomen and pelvis pain and distension for the past 3 days. Nausea and vomiting during that time. Stage IV colon cancer. EXAM: CT ABDOMEN AND PELVIS WITH CONTRAST TECHNIQUE: Multidetector CT imaging of the abdomen and pelvis was performed using the standard protocol following bolus administration of intravenous contrast. CONTRAST:  145mL ISOVUE-300 IOPAMIDOL (ISOVUE-300) INJECTION 61% COMPARISON:  Chest and abdomen radiographs dated 04/23/2016. PET-CT dated 03/04/2016 and abdomen and pelvis CT dated 02/18/2016. FINDINGS: Lower chest: Mild bibasilar linear atelectasis. Hepatobiliary: Interval peritoneal masses and associated fluid indenting into the dome  of the liver. No intrahepatic masses are seen. Unremarkable gallbladder. Pancreas: Unremarkable. No pancreatic ductal dilatation or surrounding inflammatory changes. Spleen: Normal in size without focal abnormality. Adrenals/Urinary Tract: Adrenal glands are unremarkable. Kidneys are normal, without renal calculi, focal lesion, or hydronephrosis. Bladder is unremarkable. Stomach/Bowel: Interval multiple dilated proximal small bowel loops with normal caliber distal loops. The right colon is also dilated. There is an hepatic flexure colon mass measuring 10.4 cm in length and 2.2 cm in maximum thickness. The stomach is dilated. The mid and distal colon are normal in caliber. There is concentric wall thickening involving the distal rectum with a maximum thickness of 1.2 cm, not present on 02/18/2016 where there was better distention of the rectum. No evidence of appendicitis. Vascular/Lymphatic: Atheromatous arterial calcifications, including mild calcifications in the abdominal aorta. Duplicated inferior vena cava multiple mesenteric masses with progression. Some of these could represent mesenteric nodes. Reproductive: Normal sized prostate gland. Other: Small amount of free peritoneal fluid with improvement. Small bilateral inguinal  hernias containing fat. Musculoskeletal: Lumbar and thoracic spine degenerative changes. No evidence of bony metastatic disease. IMPRESSION: 1. Partial small bowel obstruction. This could be due to an obstructing peritoneal metastasis or adhesion. 2. Significant increase in size of a colon mass at the hepatic flexure, causing partial obstruction of the right colon. This is compatible with the patient's known primary colon carcinoma. 3. Progressive peritoneal metastatic disease. 4. Decreased malignant ascites. 5. Concentric wall thickening involving the distal rectum. This could be due to incomplete distention of the rectum or interval proctitis. A rectal mass is also a possibility. 6. Aortic atherosclerosis. Electronically Signed   By: Claudie Revering M.D.   On: 07/13/2016 07:57    Assessment and Plan: This is a 50 y.o. male who presents with a small bowel obstruction in the setting of stage IV colon cancer.  Patient will be admitted to medicine.  Agree with NG tube placement, NPO and IV fluid hydration.  Have discussed with the patient that the NG tube will help with decompression to allow the bowel obstruction to improve on its own and will help with his distention and abdominal pain. Patient agreed to have the NG tube placed.  Discussed with the patient the potential need for surgery if conservative management fails.  The patient understands this plan and all of his questions have been answered.    Melvyn Neth, [place]

## 2016-07-13 NOTE — ED Notes (Signed)
Pt reports that the last time he had a similar episode of abdominal pain and distention the fluid had to be removed from his abdomen. (June 2017)

## 2016-07-13 NOTE — ED Provider Notes (Signed)
[facility] Emergency Department Provider Note    First MD Initiated Contact with Patient 07/13/16 [phone]     (approximate)  I have reviewed the triage vital signs and the nursing notes.   HISTORY  Chief Complaint Nausea and Emesis   HPI Andrew Ibarra is a 50 y.o. male with history of stage IV colon cancer presents to the emergency department with a four-day history of generalized abdominal pain distention nausea and vomiting. Patient states his current pain score is 10 out of 10. Patient states that his last bowel movement was 5 days ago. Patient admits to very poor by mouth intake as result of pain and vomiting. Patient denies any fever or febrile on presentation temperature 98.2.   Past Medical History:  Diagnosis Date  . Cancer [facility])    colon cancer  . Headache    H/O  . Loosening of tooth    top front  . Shortness of breath dyspnea    on exertion    Patient Active Problem List   Diagnosis Date Noted  . Cancer of ascending colon metastatic to intra-abdominal lymph node ([place]) 03/16/2016  . Abnormal findings-gastrointestinal tract   . Neoplasm of digestive system   . Rectal polyp   . 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 COLONOSCOPY WITH PROPOFOL N/A 03/06/2016   Procedure: COLONOSCOPY WITH PROPOFOL;  Surgeon: Lucilla Lame, MD;  Location: [place];  Service: Endoscopy;  Laterality: N/A;  . POLYPECTOMY  03/06/2016   Procedure: POLYPECTOMY;  Surgeon: Lucilla Lame, MD;  Location: [place];  Service: Endoscopy;;  . PORTACATH PLACEMENT Right 03/25/2016   Procedure: INSERTION PORT-A-CATH;  Surgeon: [name], MD;  Location: [facility]S;  Service: General;  Laterality: Right;  . Right foot surgery  2001    Prior to Admission medications   Medication Sig Start Date End Date Taking? Authorizing Provider  ALPRAZolam Duanne Moron) 0.5 MG tablet Take 1 tablet (0.5 mg total) by  mouth 3 (three) times daily as needed for anxiety. 06/12/16   Lloyd Huger, MD  chlorproMAZINE (THORAZINE) 50 MG tablet Take 1 tablet (50 mg total) by mouth 3 (three) times daily as needed for hiccoughs. 06/10/16   Daymon Larsen, MD  furosemide (LASIX) 20 MG tablet Take 1 tablet (20 mg total) by mouth daily as needed for fluid or edema. Patient taking differently: Take 20 mg by mouth daily as needed for fluid or edema. am 02/20/16   Gladstone Lighter, MD  lidocaine-prilocaine (EMLA) cream Apply to affected area once 03/16/16   Lloyd Huger, MD  morphine (MS CONTIN) 30 MG 12 hr tablet Take 1 tablet (30 mg total) by mouth every 12 (twelve) hours. 04/28/16   Lloyd Huger, MD  ondansetron (ZOFRAN) 8 MG tablet Take 1 tablet (8 mg total) by mouth 2 (two) times daily as needed for refractory nausea / vomiting. 06/18/16   Lloyd Huger, MD  oxyCODONE-acetaminophen (PERCOCET/ROXICET) 5-325 MG tablet Take 1 tablet by mouth every 4 (four) hours as needed for severe pain. 06/18/16   Lloyd Huger, MD  pantoprazole (PROTONIX) 20 MG tablet Take 1 tablet (20 mg total) by mouth daily. 06/10/16 06/10/17  Daymon Larsen, MD  polyethylene glycol (MIRALAX / Floria Raveling) packet Take 17 g by mouth daily.    Historical Provider, MD  prochlorperazine (COMPAZINE) 10 MG tablet Take 1 tablet (10 mg total) by mouth every 6 (six) hours as needed  for nausea or vomiting. 06/18/16   Lloyd Huger, MD    Allergies No known drug allergies  Family History  Problem Relation Age of Onset  . Pancreatic cancer Maternal Grandfather   . Lymphoma Maternal Grandmother   . CAD Father   . Heart disease Father     Social History Social History  Substance Use Topics  . Smoking status: Current Every Day Smoker    Packs/day: 0.50    Years: 35.00    Types: Cigarettes  . Smokeless tobacco: Never Used     Comment: has more than 30 pack years history  . Alcohol use No    Review of Systems Constitutional: No  fever/chills Eyes: No visual changes. ENT: No sore throat. Cardiovascular: Denies chest pain. Respiratory: Denies shortness of breath. Gastrointestinal: Positive for abdominal pain nausea and vomiting Genitourinary: Negative for dysuria. Musculoskeletal: Negative for back pain. Skin: Negative for rash. Neurological: Negative for headaches, focal weakness or numbness.  10-point ROS otherwise negative.  ____________________________________________   PHYSICAL EXAM:  VITAL SIGNS: ED Triage Vitals  Enc Vitals Group     BP 07/13/16 0314 (!) 166/101     Pulse Rate 07/13/16 0314 96     Resp 07/13/16 0314 20     Temp 07/13/16 0314 98.2 F (36.8 C)     Temp Source 07/13/16 0314 Oral     SpO2 07/13/16 0314 95 %     Weight 07/13/16 0313 209 lb (94.8 kg)     Height 07/13/16 0313 5\' 9"  (1.753 m)     Head Circumference --      Peak Flow --      Pain Score 07/13/16 0540 8     Pain Loc --      Pain Edu? --      Excl. in [place]? --     Constitutional: Alert and oriented. Apparent discomfort. Eyes: Conjunctivae are normal. PERRL. EOMI. Head: Atraumatic. Ears:  Healthy appearing ear canals and TMs bilaterally Nose: No congestion/rhinnorhea. Mouth/Throat: Mucous membranes are moist.  Oropharynx non-erythematous. Cardiovascular: Normal rate, regular rhythm. Good peripheral circulation. Grossly normal heart sounds. Respiratory: Normal respiratory effort.  No retractions. Lungs CTAB. Gastrointestinal: Generalized tenderness to palpation, abdominal distention. Musculoskeletal: No lower extremity tenderness nor edema. No gross deformities of extremities. Neurologic:  Normal speech and language. No gross focal neurologic deficits are appreciated.  Skin:  Skin is warm, dry and intact. No rash noted. Psychiatric: Mood and affect are normal. Speech and behavior are normal.  ____________________________________________   LABS (all labs ordered are listed, but only abnormal results are  displayed)  Labs Reviewed  COMPREHENSIVE METABOLIC PANEL - Abnormal; Notable for the following:       Result Value   Sodium 133 (*)    Chloride 99 (*)    Glucose, Bld 110 (*)    Total Protein 8.4 (*)    ALT 9 (*)    All other components within normal limits  CBC - Abnormal; Notable for the following:    RDW 20.2 (*)    All other components within normal limits  LIPASE, BLOOD   _____________________________ RADIOLOGY I, [place] N BROWN, personally viewed and evaluated these images (plain radiographs) as part of my medical decision making, as well as reviewing the written report by the radiologist.  No results found.   Procedures     INITIAL IMPRESSION / ASSESSMENT AND PLAN / ED COURSE  Pertinent labs & imaging results that were available during my care of the patient were reviewed  by me and considered in my medical decision making (see chart for details).  Patient's care transferred to Dr. Reita Cliche pending CT scan results with concern for possible small bowel obstruction   Clinical Course    ____________________________________________  FINAL CLINICAL IMPRESSION(S) / ED DIAGNOSES  Final diagnoses:  Partial small bowel obstruction     MEDICATIONS GIVEN DURING THIS VISIT:  Medications  ondansetron (ZOFRAN) injection 4 mg (4 mg Intravenous Given 07/13/16 0321)  morphine 2 MG/ML injection 4 mg (4 mg Intravenous Given 07/13/16 0553)  ondansetron (ZOFRAN) injection 4 mg (4 mg Intravenous Given 07/13/16 0556)  iopamidol (ISOVUE-300) 61 % injection 30 mL (30 mLs Oral Contrast Given 07/13/16 0614)     NEW OUTPATIENT MEDICATIONS STARTED DURING THIS VISIT:  New Prescriptions   No medications on file    Modified Medications   No medications on file    Discontinued Medications   No medications on file     Note:  This document was prepared using Dragon voice recognition software and may include unintentional dictation errors.    Gregor Hams, MD 07/14/16  [phone]

## 2016-07-13 NOTE — Progress Notes (Signed)
Talked to Dr. Darvin Neighbours about patient's BP being elevated.  He acknowledged and put in orders for Hydralazine

## 2016-07-14 ENCOUNTER — Encounter: Admission: RE | Admit: 2016-07-14 | Payer: Commercial Managed Care - PPO | Source: Ambulatory Visit

## 2016-07-14 LAB — CBC
HCT: 41.6 % (ref 40.0–52.0)
HEMOGLOBIN: 13.7 g/dL (ref 13.0–18.0)
MCH: 28.3 pg (ref 26.0–34.0)
MCHC: 33 g/dL (ref 32.0–36.0)
MCV: 85.6 fL (ref 80.0–100.0)
PLATELETS: 193 10*3/uL (ref 150–440)
RBC: 4.86 MIL/uL (ref 4.40–5.90)
RDW: 19.1 % — ABNORMAL HIGH (ref 11.5–14.5)
WBC: 8.9 10*3/uL (ref 3.8–10.6)

## 2016-07-14 LAB — BASIC METABOLIC PANEL
ANION GAP: 6 (ref 5–15)
BUN: 13 mg/dL (ref 6–20)
CALCIUM: 8.5 mg/dL — AB (ref 8.9–10.3)
CO2: 25 mmol/L (ref 22–32)
CREATININE: 0.76 mg/dL (ref 0.61–1.24)
Chloride: 101 mmol/L (ref 101–111)
Glucose, Bld: 94 mg/dL (ref 65–99)
Potassium: 4.2 mmol/L (ref 3.5–5.1)
SODIUM: 132 mmol/L — AB (ref 135–145)

## 2016-07-14 MED ORDER — LORAZEPAM 2 MG/ML IJ SOLN
0.5000 mg | Freq: Four times a day (QID) | INTRAMUSCULAR | Status: DC | PRN
  Administered 2016-07-15 – 2016-07-23 (×7): 0.5 mg via INTRAVENOUS
  Filled 2016-07-14 (×7): qty 1

## 2016-07-14 MED ORDER — OXYCODONE-ACETAMINOPHEN 5-325 MG PO TABS
1.0000 | ORAL_TABLET | Freq: Four times a day (QID) | ORAL | Status: DC | PRN
  Administered 2016-07-14: 2 via ORAL
  Administered 2016-07-15: 1 via ORAL
  Administered 2016-07-15: 2 via ORAL
  Administered 2016-07-15: 1 via ORAL
  Administered 2016-07-15 – 2016-07-17 (×4): 2 via ORAL
  Administered 2016-07-17 (×2): 1 via ORAL
  Administered 2016-07-17 – 2016-07-18 (×4): 2 via ORAL
  Administered 2016-07-19 – 2016-07-20 (×6): 1 via ORAL
  Administered 2016-07-20 – 2016-07-21 (×2): 2 via ORAL
  Filled 2016-07-14: qty 1
  Filled 2016-07-14: qty 2
  Filled 2016-07-14: qty 1
  Filled 2016-07-14 (×4): qty 2
  Filled 2016-07-14 (×2): qty 1
  Filled 2016-07-14 (×3): qty 2
  Filled 2016-07-14 (×3): qty 1
  Filled 2016-07-14 (×2): qty 2
  Filled 2016-07-14 (×3): qty 1
  Filled 2016-07-14 (×2): qty 2
  Filled 2016-07-14: qty 1

## 2016-07-14 NOTE — Progress Notes (Signed)
I had a long discussion with the patient and his wife in the presence of hospital chaplain. He does not appear to have a good understanding of the overall severity of his problem. He has a huge right colon mass without nearly completely obstructing his colon. He wanted his nasogastric tube removed and so we accommodated him. Started back on some clear liquids but I anticipate that he will become nauseated with increasing abdominal pain very quickly. Because he is less ascites now he could be a candidate for palliative surgical intervention with a colonic bypass or even an ileostomy. I discussed the plan with him in detail. He's going to talk to his family and make a decision. No other option would be changing chemotherapies that might allow for some shrinkage of his tumor mass but I think the outcome is unlikely. A palliative care consult would probably be a reasonable choice to help him understand the severity of his problem.

## 2016-07-14 NOTE — Progress Notes (Signed)
Pt was alert but in pain. Called nurse for pain medication. Wife was present. Pt retired from truck driving now on disability. [facility] offered prayer.  [place] is available for follow up.   07/14/16 1150  Clinical Encounter Type  Visited With Patient and family together  Visit Type Initial  Referral From Nurse  Spiritual Encounters  Spiritual Needs Prayer;Emotional  Stress Factors  Patient Stress Factors Health changes  Family Stress Factors None identified

## 2016-07-14 NOTE — Progress Notes (Signed)
Initial Nutrition Assessment  DOCUMENTATION CODES:   Severe malnutrition in context of chronic illness  INTERVENTION:  -Await diet progression, poc -Recommend addition of EnsureEnlive once diet advanced  NUTRITION DIAGNOSIS:   Malnutrition related to chronic illness as evidenced by moderate to severe fluid accumulation, percent weight loss, severe depletion of muscle mass, moderate depletion of body fat.  GOAL:   Patient will meet greater than or equal to 90% of their needs   MONITOR:   Diet advancement, Labs, Weight trends  REASON FOR ASSESSMENT:   Consult Assessment of nutrition requirement/status  ASSESSMENT:   50 yo admitted with intractable N/V for 4 days with partial SBO likely due to colonic mass; pt with stage IV colon cancer with peritoneal carcinomatosis undergoing chemotherapy  NG tube in place on visit today, pt very unhappy about NG tube. Pryor Montes RN and MD aware.  Pt reports his last meal was Wednesday. Ate bites on Thursday and Friday with 1 Boost and nothing since. Pt reports prior to this eating very well but very vague about what he actually eats. During chemo treatment, Pt also reported episodes of vomiting, poor appetite and diarrhea.  Pt reports weight had been stable at 209 pounds; current wt of 203 pounds;2.9% wt loss. Per wt encounters (see below), pt with significant wt loss since June of this year (15% in 5-6 months)   Nutrition-Focused physical exam completed. Findings are moderate fat depletion in face only, mild/moderate (upper body) to severe (temoral) muscle depletion, and moderate/severe edema with ascites    Past Medical History:  Diagnosis Date  . Cancer [facility])    colon cancer  . Headache    H/O  . Loosening of tooth    top front  . Shortness of breath dyspnea    on exertion   Diet Order:  Diet NPO time specified  Skin:  Reviewed, no issues  Last BM:  10/25   Labs:  sodium 132  Meds: LR at 100 ml/hr  Height:   Ht Readings from  Last 1 Encounters:  07/13/16 5\' 9"  (1.753 m)    Weight:   Wt Readings from Last 1 Encounters:  07/14/16 203 lb (92.1 kg)    Wt Readings from Last 10 Encounters:  07/14/16 203 lb (92.1 kg)  06/18/16 208 lb 0.1 oz (94.3 kg)  06/10/16 206 lb (93.4 kg)  06/04/16 213 lb 1.2 oz (96.6 kg)  05/07/16 211 lb 12 oz (96.1 kg)  04/09/16 227 lb 6.5 oz (103.1 kg)  03/26/16 236 lb 12.4 oz (107.4 kg)  03/20/16 248 lb (112.5 kg)  03/12/16 239 lb 10.2 oz (108.7 kg)  03/06/16 230 lb (104.3 kg)    BMI:  Body mass index is 29.98 kg/m.  Estimated Nutritional Needs:   Kcal:  [id number] kcals  Protein:  115-130 g  Fluid:  >/= 2.3 L  EDUCATION NEEDS:   No education needs identified at this time   [place], [place], [place] [phone] Pager  [phone] Weekend/On-Call Pager

## 2016-07-14 NOTE — Progress Notes (Signed)
NGT removed per MD order.

## 2016-07-14 NOTE — Progress Notes (Signed)
Subjective:   He is complaining bitterly about his nasogastric tube. He has had approximately 1300 cc out over the last 18 hours. He has been sick for several days. He has been undergoing chemotherapy regularly. His major complaint is nausea and abdominal pain.  Vital signs in last 24 hours: Temp:  [97.5 F (36.4 C)-98.1 F (36.7 C)] 97.6 F (36.4 C) (10/30 0528) Pulse Rate:  [80-92] 82 (10/30 0528) Resp:  [11-20] 20 (10/30 0528) BP: (162-175)/(87-112) 173/101 (10/30 0528) SpO2:  [94 %-98 %] 98 % (10/30 0528) Weight:  [90.9 kg (200 lb 6.4 oz)-92.1 kg (203 lb)] 92.1 kg (203 lb) (10/30 0603) Last BM Date: 07/09/16  Intake/Output from previous day: 10/29 0701 - 10/30 0700 In: 3569 [I.V.:1519; NG/GT:50; IV Piggyback:2000] Out: 2100 [Urine:800; Emesis/NG output:1300]  Exam:  His abdomen is distended with hypoactive bowel sounds and some mild left lower quadrant right upper quadrant tenderness. He does not have any rebound or guarding.  Lab Results:  CBC  Recent Labs  07/13/16 0316 07/14/16 0452  WBC 10.0 8.9  HGB 14.6 13.7  HCT 43.8 41.6  PLT 251 193   CMP     Component Value Date/Time   NA 132 (L) 07/14/2016 0452   K 4.2 07/14/2016 0452   CL 101 07/14/2016 0452   CO2 25 07/14/2016 0452   GLUCOSE 94 07/14/2016 0452   BUN 13 07/14/2016 0452   CREATININE 0.76 07/14/2016 0452   CALCIUM 8.5 (L) 07/14/2016 0452   PROT 8.4 (H) 07/13/2016 0316   ALBUMIN 3.8 07/13/2016 0316   AST 20 07/13/2016 0316   ALT 9 (L) 07/13/2016 0316   ALKPHOS 94 07/13/2016 0316   BILITOT 0.8 07/13/2016 0316   GFRNONAA >60 07/14/2016 0452   GFRAA >60 07/14/2016 0452   PT/INR No results for input(s): LABPROT, INR in the last 72 hours.  Studies/Results: Dg Abd 1 View  Result Date: 07/13/2016 CLINICAL DATA:  Enteric tube placement EXAM: ABDOMEN - 1 VIEW COMPARISON:  CT abdomen/ pelvis from earlier today FINDINGS: Enteric tube terminates in the very proximal stomach with the side port in the  lower thoracic esophagus. Dilated small bowel loops are again noted throughout the abdomen. Evidence of pneumatosis or pneumoperitoneum. Clear lung bases. IMPRESSION: Enteric tube terminates in the very proximal stomach with the side port in the lower thoracic esophagus, consider advancing 4-5 cm. Re- demonstration of findings of small bowel/proximal large bowel obstruction. Electronically Signed   By: Ilona Sorrel M.D.   On: 07/13/2016 11:02   Ct Abdomen Pelvis W Contrast  Result Date: 07/13/2016 CLINICAL DATA:  Abdomen and pelvis pain and distension for the past 3 days. Nausea and vomiting during that time. Stage IV colon cancer. EXAM: CT ABDOMEN AND PELVIS WITH CONTRAST TECHNIQUE: Multidetector CT imaging of the abdomen and pelvis was performed using the standard protocol following bolus administration of intravenous contrast. CONTRAST:  139mL ISOVUE-300 IOPAMIDOL (ISOVUE-300) INJECTION 61% COMPARISON:  Chest and abdomen radiographs dated 04/23/2016. PET-CT dated 03/04/2016 and abdomen and pelvis CT dated 02/18/2016. FINDINGS: Lower chest: Mild bibasilar linear atelectasis. Hepatobiliary: Interval peritoneal masses and associated fluid indenting into the dome of the liver. No intrahepatic masses are seen. Unremarkable gallbladder. Pancreas: Unremarkable. No pancreatic ductal dilatation or surrounding inflammatory changes. Spleen: Normal in size without focal abnormality. Adrenals/Urinary Tract: Adrenal glands are unremarkable. Kidneys are normal, without renal calculi, focal lesion, or hydronephrosis. Bladder is unremarkable. Stomach/Bowel: Interval multiple dilated proximal small bowel loops with normal caliber distal loops. The right colon is also  dilated. There is an hepatic flexure colon mass measuring 10.4 cm in length and 2.2 cm in maximum thickness. The stomach is dilated. The mid and distal colon are normal in caliber. There is concentric wall thickening involving the distal rectum with a maximum  thickness of 1.2 cm, not present on 02/18/2016 where there was better distention of the rectum. No evidence of appendicitis. Vascular/Lymphatic: Atheromatous arterial calcifications, including mild calcifications in the abdominal aorta. Duplicated inferior vena cava multiple mesenteric masses with progression. Some of these could represent mesenteric nodes. Reproductive: Normal sized prostate gland. Other: Small amount of free peritoneal fluid with improvement. Small bilateral inguinal hernias containing fat. Musculoskeletal: Lumbar and thoracic spine degenerative changes. No evidence of bony metastatic disease. IMPRESSION: 1. Partial small bowel obstruction. This could be due to an obstructing peritoneal metastasis or adhesion. 2. Significant increase in size of a colon mass at the hepatic flexure, causing partial obstruction of the right colon. This is compatible with the patient's known primary colon carcinoma. 3. Progressive peritoneal metastatic disease. 4. Decreased malignant ascites. 5. Concentric wall thickening involving the distal rectum. This could be due to incomplete distention of the rectum or interval proctitis. A rectal mass is also a possibility. 6. Aortic atherosclerosis. Electronically Signed   By: Claudie Revering M.D.   On: 07/13/2016 07:57    Assessment/Plan: His CT scan demonstrated an increase in his colonic mass likely the source of his obstruction as the right colon is markedly distended. His ascites appears improved. Obstruction is the main problem. I do not think he is going to improve with nasogastric suction for any length of time. Because of the decrease in his ascites he may be a candidate for a ileal diversion in order to allow him to eat and move his bowels. Such a procedure would certainly be a palliative procedure that may help in the circumstances concerning his advancing symptoms.

## 2016-07-14 NOTE — Progress Notes (Signed)
[place] [facility] [place] NAME: Andrew Ibarra    MR#:  [id number]  DATE OF BIRTH:  [birth year]  SUBJECTIVE:  CHIEF COMPLAINT:   Chief Complaint  Patient presents with  . Nausea  . Emesis   Continues to have abdominal pain. 1300 ML from NG suction overnight.  Patient frustrated that he is unable to eat and has an NG tube.  REVIEW OF SYSTEMS:    Review of Systems  Constitutional: Negative for chills and fever.  HENT: Negative for sore throat.   Eyes: Negative for blurred vision, double vision and pain.  Respiratory: Negative for cough, hemoptysis, shortness of breath and wheezing.   Cardiovascular: Negative for chest pain, palpitations, orthopnea and leg swelling.  Gastrointestinal: Positive for abdominal pain and nausea. Negative for constipation, diarrhea, heartburn and vomiting.  Genitourinary: Negative for dysuria and hematuria.  Musculoskeletal: Negative for back pain and joint pain.  Skin: Negative for rash.  Neurological: Negative for sensory change, speech change, focal weakness and headaches.  Endo/Heme/Allergies: Does not bruise/bleed easily.  Psychiatric/Behavioral: Negative for depression. The patient is nervous/anxious.     DRUG ALLERGIES:  No Known Allergies  VITALS:  Blood pressure (!) 158/99, pulse 77, temperature 97.6 F (36.4 C), temperature source Oral, resp. rate 17, height 5\' 9"  (1.753 m), weight 92.1 kg (203 lb), SpO2 99 %.  PHYSICAL EXAMINATION:   Physical Exam  GENERAL:  50 y.o.-year-old patient lying in the bed with no acute distress.  EYES: Pupils equal, round, reactive to light and accommodation. No scleral icterus. Extraocular muscles intact.  HEENT: Head atraumatic, normocephalic. Oropharynx and nasopharynx clear.  NECK:  Supple, no jugular venous distention. No thyroid enlargement, no tenderness.  LUNGS: Normal breath sounds bilaterally, no wheezing, rales, rhonchi. No use of accessory muscles of  respiration.  CARDIOVASCULAR: S1, S2 normal. No murmurs, rubs, or gallops.  ABDOMEN: Soft, nontender, nondistended. Bowel sounds present. No organomegaly or mass.  EXTREMITIES: No cyanosis, clubbing or edema b/l.    NEUROLOGIC: Cranial nerves II through XII are intact. No focal Motor or sensory deficits b/l.   PSYCHIATRIC: The patient is alert and oriented x 3.  SKIN: No obvious rash, lesion, or ulcer.   LABORATORY PANEL:   CBC  Recent Labs Lab 07/14/16 0452  WBC 8.9  HGB 13.7  HCT 41.6  PLT 193   ------------------------------------------------------------------------------------------------------------------ Chemistries   Recent Labs Lab 07/13/16 0316 07/14/16 0452  NA 133* 132*  K 4.5 4.2  CL 99* 101  CO2 25 25  GLUCOSE 110* 94  BUN 16 13  CREATININE 0.77 0.76  CALCIUM 9.2 8.5*  AST 20  --   ALT 9*  --   ALKPHOS 94  --   BILITOT 0.8  --    ------------------------------------------------------------------------------------------------------------------  Cardiac Enzymes No results for input(s): TROPONINI in the last 168 hours. ------------------------------------------------------------------------------------------------------------------  RADIOLOGY:  Dg Abd 1 View  Result Date: 07/13/2016 CLINICAL DATA:  Enteric tube placement EXAM: ABDOMEN - 1 VIEW COMPARISON:  CT abdomen/ pelvis from earlier today FINDINGS: Enteric tube terminates in the very proximal stomach with the side port in the lower thoracic esophagus. Dilated small bowel loops are again noted throughout the abdomen. Evidence of pneumatosis or pneumoperitoneum. Clear lung bases. IMPRESSION: Enteric tube terminates in the very proximal stomach with the side port in the lower thoracic esophagus, consider advancing 4-5 cm. Re- demonstration of findings of small bowel/proximal large bowel obstruction. Electronically Signed   By: Ilona Sorrel M.D.   On:  07/13/2016 11:02   Ct Abdomen Pelvis W  Contrast  Result Date: 07/13/2016 CLINICAL DATA:  Abdomen and pelvis pain and distension for the past 3 days. Nausea and vomiting during that time. Stage IV colon cancer. EXAM: CT ABDOMEN AND PELVIS WITH CONTRAST TECHNIQUE: Multidetector CT imaging of the abdomen and pelvis was performed using the standard protocol following bolus administration of intravenous contrast. CONTRAST:  175mL ISOVUE-300 IOPAMIDOL (ISOVUE-300) INJECTION 61% COMPARISON:  Chest and abdomen radiographs dated 04/23/2016. PET-CT dated 03/04/2016 and abdomen and pelvis CT dated 02/18/2016. FINDINGS: Lower chest: Mild bibasilar linear atelectasis. Hepatobiliary: Interval peritoneal masses and associated fluid indenting into the dome of the liver. No intrahepatic masses are seen. Unremarkable gallbladder. Pancreas: Unremarkable. No pancreatic ductal dilatation or surrounding inflammatory changes. Spleen: Normal in size without focal abnormality. Adrenals/Urinary Tract: Adrenal glands are unremarkable. Kidneys are normal, without renal calculi, focal lesion, or hydronephrosis. Bladder is unremarkable. Stomach/Bowel: Interval multiple dilated proximal small bowel loops with normal caliber distal loops. The right colon is also dilated. There is an hepatic flexure colon mass measuring 10.4 cm in length and 2.2 cm in maximum thickness. The stomach is dilated. The mid and distal colon are normal in caliber. There is concentric wall thickening involving the distal rectum with a maximum thickness of 1.2 cm, not present on 02/18/2016 where there was better distention of the rectum. No evidence of appendicitis. Vascular/Lymphatic: Atheromatous arterial calcifications, including mild calcifications in the abdominal aorta. Duplicated inferior vena cava multiple mesenteric masses with progression. Some of these could represent mesenteric nodes. Reproductive: Normal sized prostate gland. Other: Small amount of free peritoneal fluid with improvement. Small  bilateral inguinal hernias containing fat. Musculoskeletal: Lumbar and thoracic spine degenerative changes. No evidence of bony metastatic disease. IMPRESSION: 1. Partial small bowel obstruction. This could be due to an obstructing peritoneal metastasis or adhesion. 2. Significant increase in size of a colon mass at the hepatic flexure, causing partial obstruction of the right colon. This is compatible with the patient's known primary colon carcinoma. 3. Progressive peritoneal metastatic disease. 4. Decreased malignant ascites. 5. Concentric wall thickening involving the distal rectum. This could be due to incomplete distention of the rectum or interval proctitis. A rectal mass is also a possibility. 6. Aortic atherosclerosis. Electronically Signed   By: Claudie Revering M.D.   On: 07/13/2016 07:57     ASSESSMENT AND PLAN:   * Partial small bowel obstruction This is likely due to his peritoneal carcinomatosis. NGT in place Nothing by mouth. IV fluids. Surgical team considering surgery.  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 Follows with Dr. Grayland Ormond.  * Dehydration due to vomiting. start IV fluids.  * Hypovolemic hyponatremia IVF  * Elevated blood pressure without diagnosis of hypertension Likely due to abdominal pain. Monitor.Added PRN hydralazine  * DVT prophylaxis with Lovenox  All the records are reviewed and case discussed with Care Management/Social Workerr. Management plans discussed with the patient, family and they are in agreement.  CODE STATUS: FULL CODE  DVT Prophylaxis: SCDs  TOTAL TIME TAKING CARE OF THIS PATIENT: 30 minutes.   POSSIBLE D/C IN 2-3 DAYS, DEPENDING ON CLINICAL CONDITION.  Hillary Bow R M.D on 07/14/2016 at 11:27 AM  Between 7am to 6pm - Pager - [phone]  After 6pm go to [email or web address] - password [email or web address] [facility]  Office  [phone]  CC: Primary care physician;  Nathaneil Canary, PA-C  Note: This dictation was prepared with Dragon dictation  along with smaller phrase technology. Any transcriptional errors that result from this process are unintentional.

## 2016-07-15 DIAGNOSIS — C189 Malignant neoplasm of colon, unspecified: Secondary | ICD-10-CM

## 2016-07-15 DIAGNOSIS — Z7189 Other specified counseling: Secondary | ICD-10-CM

## 2016-07-15 DIAGNOSIS — Z515 Encounter for palliative care: Secondary | ICD-10-CM

## 2016-07-15 DIAGNOSIS — K56600 Partial intestinal obstruction, unspecified as to cause: Secondary | ICD-10-CM

## 2016-07-15 DIAGNOSIS — E43 Unspecified severe protein-calorie malnutrition: Secondary | ICD-10-CM | POA: Insufficient documentation

## 2016-07-15 LAB — SURGICAL PCR SCREEN
MRSA, PCR: NEGATIVE
STAPHYLOCOCCUS AUREUS: NEGATIVE

## 2016-07-15 MED ORDER — METRONIDAZOLE IN NACL 5-0.79 MG/ML-% IV SOLN
500.0000 mg | INTRAVENOUS | Status: AC
  Administered 2016-07-16: 500 mg via INTRAVENOUS
  Filled 2016-07-15: qty 100

## 2016-07-15 MED ORDER — AMLODIPINE BESYLATE 5 MG PO TABS
5.0000 mg | ORAL_TABLET | Freq: Every day | ORAL | Status: DC
  Administered 2016-07-15 – 2016-07-23 (×8): 5 mg via ORAL
  Filled 2016-07-15 (×8): qty 1

## 2016-07-15 MED ORDER — CEFAZOLIN (ANCEF) 1 G IV SOLR
1.0000 g | INTRAVENOUS | Status: DC
  Filled 2016-07-15 (×2): qty 1

## 2016-07-15 NOTE — Progress Notes (Signed)
Pt was alert and in better spirits due to removal of nasal tube. Pt has extreme craving for cigarette but will not accept nicotine patch. Pt anticipates surgery Wed but was not anxious. CH offered prayer.   07/15/16 1035  Clinical Encounter Type  Visited With Patient and family together  Visit Type Follow-up  Referral From Nurse  Spiritual Encounters  Spiritual Needs Prayer;Emotional  Stress Factors  Patient Stress Factors None identified  Family Stress Factors None identified

## 2016-07-15 NOTE — Consult Note (Signed)
Attempted to evaluate patient earlier in the afternoon, but patient had left the floor unbeknownst to nursing to smoke a cigarette. Will reattempt evaluation tomorrow. CT scan results noted with progression of disease. If patient wishes to continue aggressive treatment, will have to switch chemotherapies. Appreciate palliative care input.  Full consult to follow.

## 2016-07-15 NOTE — Progress Notes (Signed)
[facility] [place] NAME: Andrew Ibarra    MR#:  [id number]  DATE OF BIRTH:  Jan 16, 1966  SUBJECTIVE:  CHIEF COMPLAINT:   Chief Complaint  Patient presents with  . Nausea  . Emesis   NG tube removed. Patient had a large bowel movement. Passing gas. Tolerating clear liquids.  REVIEW OF SYSTEMS:    Review of Systems  Constitutional: Negative for chills and fever.  HENT: Negative for sore throat.   Eyes: Negative for blurred vision, double vision and pain.  Respiratory: Negative for cough, hemoptysis, shortness of breath and wheezing.   Cardiovascular: Negative for chest pain, palpitations, orthopnea and leg swelling.  Gastrointestinal: Positive for abdominal pain and nausea. Negative for constipation, diarrhea, heartburn and vomiting.  Genitourinary: Negative for dysuria and hematuria.  Musculoskeletal: Negative for back pain and joint pain.  Skin: Negative for rash.  Neurological: Negative for sensory change, speech change, focal weakness and headaches.  Endo/Heme/Allergies: Does not bruise/bleed easily.  Psychiatric/Behavioral: Negative for depression. The patient is nervous/anxious.     DRUG ALLERGIES:  No Known Allergies  VITALS:  Blood pressure (!) 171/96, pulse 78, temperature 97.9 F (36.6 C), temperature source Oral, resp. rate 20, height 5\' 9"  (1.753 m), weight 92.9 kg (204 lb 14.4 oz), SpO2 98 %.  PHYSICAL EXAMINATION:   Physical Exam  GENERAL:  50 y.o.-year-old patient lying in the bed with no acute distress.  EYES: Pupils equal, round, reactive to light and accommodation. No scleral icterus. Extraocular muscles intact.  HEENT: Head atraumatic, normocephalic. Oropharynx and nasopharynx clear.  NECK:  Supple, no jugular venous distention. No thyroid enlargement, no tenderness.  LUNGS: Normal breath sounds bilaterally, no wheezing, rales, rhonchi. No use of accessory muscles of respiration.  CARDIOVASCULAR: S1, S2  normal. No murmurs, rubs, or gallops.  ABDOMEN: Soft, nontender, nondistended. Bowel sounds present. No organomegaly or mass.  EXTREMITIES: No cyanosis, clubbing or edema b/l.    NEUROLOGIC: Cranial nerves II through XII are intact. No focal Motor or sensory deficits b/l.   PSYCHIATRIC: The patient is alert and oriented x 3.  SKIN: No obvious rash, lesion, or ulcer.   LABORATORY PANEL:   CBC  Recent Labs Lab 07/14/16 0452  WBC 8.9  HGB 13.7  HCT 41.6  PLT 193   ------------------------------------------------------------------------------------------------------------------ Chemistries   Recent Labs Lab 07/13/16 0316 07/14/16 0452  NA 133* 132*  K 4.5 4.2  CL 99* 101  CO2 25 25  GLUCOSE 110* 94  BUN 16 13  CREATININE 0.77 0.76  CALCIUM 9.2 8.5*  AST 20  --   ALT 9*  --   ALKPHOS 94  --   BILITOT 0.8  --    ------------------------------------------------------------------------------------------------------------------  Cardiac Enzymes No results for input(s): TROPONINI in the last 168 hours. ------------------------------------------------------------------------------------------------------------------  RADIOLOGY:  No results found.   ASSESSMENT AND PLAN:   * Partial small bowel obstruction This is likely due to his peritoneal carcinomatosis. NG tube removed Liquids Discussed with Dr. Pat Patrick. Patient would benefit from diabetic ileostomy due to worsening colon cancer burden causing obstruction.  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 Follows with Dr. Grayland Ormond. Request oncology consultation patient  * Dehydration due to vomiting. Resolved  * Hypovolemic hyponatremia    IVF  * Elevated blood pressure without diagnosis of hypertension Consistently elevated blood pressure. Add Norvasc.   * DVT prophylaxis with Lovenox  All the records are reviewed and case discussed with Care Management/Social  Workerr. Management plans discussed with the patient, family and they are in agreement.  Requested palliative care and oncology consult.  CODE STATUS: FULL CODE  DVT Prophylaxis: SCDs  TOTAL TIME TAKING CARE OF THIS PATIENT: 30 minutes.   Hillary Bow R M.D on 07/15/2016 at 12:17 PM  Between 7am to 6pm - Pager - [phone]  After 6pm go to [email or web address] - password [email or web address] [facility]  Office  [phone]  CC: Primary care physician; Nathaneil Canary, PA-C  Note: This dictation was prepared with Dragon dictation along with smaller phrase technology. Any transcriptional errors that result from this process are unintentional.

## 2016-07-15 NOTE — Consult Note (Signed)
Consultation Note Date: 07/15/2016   Patient Name: Andrew Ibarra  DOB: [birth year]  MRN: [id number]  Age / Sex: 50 y.o., male  PCP: Andrew Canary, PA-C Referring Physician: Hillary Bow, MD  Reason for Consultation: GOC  HPI/Patient Profile: 50 y.o. 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   Clinical Assessment and Goals of Care: I met today along with Andrew Dow, NP at Andrew Ibarra's beside with patient and his wife, Andrew Ibarra. Very pleasant and welcoming family. They have many concerns regarding potential surgery tomorrow, progression of his cancer and treatment options, and Andrew Ibarra especially concerned about QOL.   We discussed his advanced stage colon cancer and how he tolerated treatment. He says that he tolerated the first few cycles well but the last couple were very hard on him. He questions the benefits vs risks of further treatment - I encourage him to discuss further options for treatment and these benefits vs risks further with Andrew Ibarra. He does understand that it is always within his power to decide how aggressive he wishes to be based on how said interventions impact his QOL.   He struggles with loss of being able to work and provide for his family. He has somewhat lost part of his identity. He was able to perform life review and expresses regret at some of the choices he has made in life and apologized to his wife for their hard times. Andrew Ibarra is very supportive and encourages him that although he has had some mistakes he is a good man. Therapeutic listening provided.   Andrew Ibarra was also  able to express his wishes at EOL. He does tell us that he would not want to be resuscitated. He also tells [place] that if he continues to decline he wishes to spend his time at home and to die at home and not in a hospital. He wants to focus on quality time with his family. Although with all this being said functional status remains good and he says "I am not ready to leave this earth." Open to surgical intervention with hopes of improved symptoms and QOL, open to discussing further chemotherapy treatment, open to continued palliative follow up even as outpatient. He is also familiar with hospice and when this can be helpful for him as well.   Primary Decision Maker HCPOA wife Andrew Ibarra    SUMMARY OF RECOMMENDATIONS   - Open to surgery and further treatment options to be discussed with Andrew Ibarra - QOL is his priority - Will need outpatient palliative services (although I will continue to follow while hospitalized)   Code Status/Advance Care Planning:  DNR   Symptom Management:   Abd pain: Continue oxycodone-acetaminophen 5-325 mg 2 tablets every 6 hours prn. Morphine for severe  pain unrelieved by oxycodone.   Anxiety: Xanax 0.5 mg TID prn at home. Ativan prn here. Seems to have history of chronic anxiety per our conversation.   Palliative Prophylaxis:   Bowel Regimen, Delirium Protocol and Frequent Pain Assessment  Additional Recommendations (Limitations, Scope, Preferences):  Full Scope Treatment  Psycho-social/Spiritual:   Desire for further Chaplaincy support:yes  Additional Recommendations: Caregiving  Support/Resources  Prognosis:   Unable to determine but I fear is very poor given progression of cancer and now malignant partial SBO. Likely eligible for hospice without further chemotherapy.   Discharge Planning: Home with Palliative Services likely      Primary Diagnoses: Present on Admission: . SBO (small bowel obstruction) . Partial small bowel  obstruction   I have reviewed the medical record, interviewed the patient and family, and examined the patient. The following aspects are pertinent.  Past Medical History:  Diagnosis Date  . Cancer [facility])    colon cancer  . Headache    H/O  . Loosening of tooth    top front  . Shortness of breath dyspnea    on exertion   Social History   Social History  . Marital status: Married    Spouse name: N/A  . Number of children: N/A  . Years of education: N/A   Social History Main Topics  . Smoking status: Current Every Day Smoker    Packs/day: 0.50    Years: 35.00    Types: Cigarettes  . Smokeless tobacco: Never Used     Comment: has more than 30 pack years history  . Alcohol use No  . Drug use: No  . Sexual activity: Not Asked   Other Topics Concern  . None   Social History Narrative   Independent at baseline. Lives with wife.   Family History  Problem Relation Age of Onset  . Pancreatic cancer Maternal Grandfather   . Lymphoma Maternal Grandmother   . CAD Father   . Heart disease Father    Scheduled Meds: . amLODipine  5 mg Oral Daily  . enoxaparin (LOVENOX) injection  40 mg Subcutaneous Q24H  . famotidine (PEPCID) IV  20 mg Intravenous Q12H  . sodium chloride flush  3 mL Intravenous Q12H   Continuous Infusions: . lactated ringers 100 mL/hr at 07/15/16 0906   PRN Meds:.acetaminophen, albuterol, bisacodyl, hydrALAZINE, LORazepam, morphine injection, ondansetron **OR** ondansetron (ZOFRAN) IV, oxyCODONE-acetaminophen No Known Allergies Review of Systems  Constitutional: Positive for activity change and fatigue. Negative for appetite change.  Gastrointestinal: Positive for abdominal pain and nausea. Negative for constipation and diarrhea.  Psychiatric/Behavioral: The patient is nervous/anxious.     Physical Exam  Constitutional: He is oriented to person, place, and time. He appears well-developed and well-nourished.  HENT:  Head: Normocephalic and  atraumatic.  Cardiovascular: Normal rate and regular rhythm.   Pulmonary/Chest: Effort normal. No accessory muscle usage. No tachypnea. No respiratory distress.  Abdominal: Soft.  Neurological: He is alert and oriented to person, place, and time.  Psychiatric: His mood appears anxious.  Nursing note and vitals reviewed.   Vital Signs: BP (!) 154/81 (BP Location: Right Arm)   Pulse 74   Temp 98.1 F (36.7 C) (Oral)   Resp 18   Ht 5' 9"  (1.753 m)   Wt 92.9 kg (204 lb 14.4 oz)   SpO2 98%   BMI 30.26 kg/m  Pain Assessment: Faces   Pain Score: 0-No pain   SpO2: SpO2: 98 % O2 Device:SpO2: 98 % O2 Flow Rate: .  IO: Intake/output summary:  Intake/Output Summary (Last 24 hours) at 07/15/16 1500 Last data filed at 07/15/16 1300  Gross per 24 hour  Intake             2151 ml  Output             1350 ml  Net              801 ml    LBM: Last BM Date: 07/09/16 Baseline Weight: Weight: 94.8 kg (209 lb) Most recent weight: Weight: 92.9 kg (204 lb 14.4 oz)     Palliative Assessment/Data: PPS: 70%      Time In: 1330 Time Out: 1500 Time Total: 15mn Greater than 50%  of this time was spent counseling and coordinating care related to the above assessment and plan.  Signed by: AVinie Sill NP Palliative Medicine Team Pager # [phone](M-F 8a-5p) Team Phone # [phone](Nights/Weekends)

## 2016-07-15 NOTE — Progress Notes (Signed)
Marty made a follow-up visit with the Pt and found out that another [facility] had visited him earlier and prayed with him. CH had a conversation with Pt and his family regarding his past life and what he desires to do when he gets better. Pt has set up goals of things he would like to accomplish when he gets better and wanted his mother to know that he will do the things he promised to do. Although the Pt was in good spirit, he was a bit anxious about the surgery he is going to have tomorrow. Pt and his family requested for prayers and support, which the [facility] provided. Pt also requested [place] to visit him again soon.    07/15/16 1400  Clinical Encounter Type  Visited With Patient and family together  Visit Type Follow-up;Spiritual support  Spiritual Encounters  Spiritual Needs Prayer;Emotional;Other (Comment)  Stress Factors  Patient Stress Factors None identified  Family Stress Factors Other (Comment)

## 2016-07-15 NOTE — Progress Notes (Signed)
Subjective:   He has no new complaints. He has had some abdominal pain and a small bowel movement. He is not nauseated. He's been tolerating liquid diet.  Vital signs in last 24 hours: Temp:  [97.9 F (36.6 C)-98.1 F (36.7 C)] 98.1 F (36.7 C) (10/31 1229) Pulse Rate:  [74-84] 74 (10/31 1229) Resp:  [18-20] 18 (10/31 1229) BP: (154-171)/(81-106) 154/81 (10/31 1229) SpO2:  [98 %-99 %] 98 % (10/31 1229) Weight:  [92.9 kg (204 lb 14.4 oz)] 92.9 kg (204 lb 14.4 oz) (10/31 0701) Last BM Date: 07/14/16  Intake/Output from previous day: 10/30 0701 - 10/31 0700 In: 2751 [P.O.:720; I.V.:1981; IV Piggyback:50] Out: 1600 [Urine:1550; Emesis/NG output:50]  Exam:  His exam is unchanged. He has some mild abdominal distention with some left lower and right lower quadrant tenderness.  Lab Results:  CBC  Recent Labs  07/13/16 0316 07/14/16 0452  WBC 10.0 8.9  HGB 14.6 13.7  HCT 43.8 41.6  PLT 251 193   CMP     Component Value Date/Time   NA 132 (L) 07/14/2016 0452   K 4.2 07/14/2016 0452   CL 101 07/14/2016 0452   CO2 25 07/14/2016 0452   GLUCOSE 94 07/14/2016 0452   BUN 13 07/14/2016 0452   CREATININE 0.76 07/14/2016 0452   CALCIUM 8.5 (L) 07/14/2016 0452   PROT 8.4 (H) 07/13/2016 0316   ALBUMIN 3.8 07/13/2016 0316   AST 20 07/13/2016 0316   ALT 9 (L) 07/13/2016 0316   ALKPHOS 94 07/13/2016 0316   BILITOT 0.8 07/13/2016 0316   GFRNONAA >60 07/14/2016 0452   GFRAA >60 07/14/2016 0452   PT/INR No results for input(s): LABPROT, INR in the last 72 hours.  Studies/Results: No results found.  Assessment/Plan: We plan a palliative surgical procedure tomorrow either: Bypass or terminal ileostomy. This plans been discussed with the patient in detail with his wife present and they're in agreement.

## 2016-07-16 ENCOUNTER — Inpatient Hospital Stay: Payer: Commercial Managed Care - PPO

## 2016-07-16 ENCOUNTER — Inpatient Hospital Stay: Payer: Commercial Managed Care - PPO | Admitting: Oncology

## 2016-07-16 ENCOUNTER — Inpatient Hospital Stay: Payer: Commercial Managed Care - PPO | Admitting: Anesthesiology

## 2016-07-16 ENCOUNTER — Encounter: Payer: Self-pay | Admitting: Anesthesiology

## 2016-07-16 ENCOUNTER — Encounter
Admission: EM | Disposition: A | Discharge status: Home / Self Care | Payer: Self-pay | Source: Home / Self Care | Attending: Surgery

## 2016-07-16 DIAGNOSIS — C189 Malignant neoplasm of colon, unspecified: Secondary | ICD-10-CM

## 2016-07-16 HISTORY — PX: COLON RESECTION: SHX5231

## 2016-07-16 SURGERY — COLON RESECTION
Anesthesia: General | Laterality: Right | Wound class: Clean Contaminated

## 2016-07-16 MED ORDER — SUGAMMADEX SODIUM 200 MG/2ML IV SOLN
INTRAVENOUS | Status: DC | PRN
  Administered 2016-07-16: 179 mg via INTRAVENOUS

## 2016-07-16 MED ORDER — FENTANYL CITRATE (PF) 100 MCG/2ML IJ SOLN
INTRAMUSCULAR | Status: AC
  Filled 2016-07-16: qty 2

## 2016-07-16 MED ORDER — MIDAZOLAM HCL 2 MG/2ML IJ SOLN
INTRAMUSCULAR | Status: DC | PRN
  Administered 2016-07-16: 2 mg via INTRAVENOUS

## 2016-07-16 MED ORDER — OXYCODONE HCL 5 MG PO TABS: 5.0000 mg | ORAL_TABLET | Freq: Once | ORAL | Status: DC | PRN

## 2016-07-16 MED ORDER — HYDROMORPHONE HCL 1 MG/ML IJ SOLN
1.0000 mg | INTRAMUSCULAR | Status: DC | PRN
  Administered 2016-07-16 – 2016-07-17 (×4): 1 mg via INTRAVENOUS
  Filled 2016-07-16 (×5): qty 1

## 2016-07-16 MED ORDER — ROCURONIUM BROMIDE 100 MG/10ML IV SOLN
INTRAVENOUS | Status: DC | PRN
  Administered 2016-07-16: 10 mg via INTRAVENOUS
  Administered 2016-07-16 (×3): 20 mg via INTRAVENOUS

## 2016-07-16 MED ORDER — DEXMEDETOMIDINE HCL 200 MCG/2ML IV SOLN
INTRAVENOUS | Status: DC | PRN
  Administered 2016-07-16: 12 ug via INTRAVENOUS

## 2016-07-16 MED ORDER — LACTATED RINGERS IV SOLN
INTRAVENOUS | Status: DC | PRN
  Administered 2016-07-16 (×2): via INTRAVENOUS

## 2016-07-16 MED ORDER — SUCCINYLCHOLINE CHLORIDE 20 MG/ML IJ SOLN
INTRAMUSCULAR | Status: DC | PRN
  Administered 2016-07-16: 100 mg via INTRAVENOUS

## 2016-07-16 MED ORDER — CEFAZOLIN SODIUM-DEXTROSE 2-3 GM-% IV SOLR
INTRAVENOUS | Status: DC | PRN
  Administered 2016-07-16: 1 g via INTRAVENOUS

## 2016-07-16 MED ORDER — PROPOFOL 10 MG/ML IV BOLUS
INTRAVENOUS | Status: DC | PRN
  Administered 2016-07-16: 150 mg via INTRAVENOUS

## 2016-07-16 MED ORDER — FENTANYL CITRATE (PF) 100 MCG/2ML IJ SOLN
25.0000 ug | INTRAMUSCULAR | Status: DC | PRN
  Administered 2016-07-16 (×3): 50 ug via INTRAVENOUS

## 2016-07-16 MED ORDER — LIDOCAINE 2% (20 MG/ML) 5 ML SYRINGE
INTRAMUSCULAR | Status: DC | PRN
  Administered 2016-07-16: 100 mg via INTRAVENOUS

## 2016-07-16 MED ORDER — LABETALOL HCL 5 MG/ML IV SOLN
INTRAVENOUS | Status: DC | PRN
  Administered 2016-07-16 (×3): 5 mg via INTRAVENOUS

## 2016-07-16 MED ORDER — FENTANYL CITRATE (PF) 100 MCG/2ML IJ SOLN
INTRAMUSCULAR | Status: DC | PRN
  Administered 2016-07-16 (×2): 100 ug via INTRAVENOUS
  Administered 2016-07-16: 150 ug via INTRAVENOUS

## 2016-07-16 MED ORDER — OXYCODONE HCL 5 MG/5ML PO SOLN: 5.0000 mg | Freq: Once | ORAL | Status: DC | PRN

## 2016-07-16 MED ORDER — DEXAMETHASONE SODIUM PHOSPHATE 10 MG/ML IJ SOLN
INTRAMUSCULAR | Status: DC | PRN
  Administered 2016-07-16: 10 mg via INTRAVENOUS

## 2016-07-16 SURGICAL SUPPLY — 53 items
CANISTER SUCT 1200ML W/VALVE (MISCELLANEOUS) ×3 IMPLANT
CATH TRAY 16F METER LATEX (MISCELLANEOUS) ×3 IMPLANT
CLIP TI LARGE 6 (CLIP) IMPLANT
CLIP TI MEDIUM 6 (CLIP) IMPLANT
CLOSURE WOUND 1/2 X4 (GAUZE/BANDAGES/DRESSINGS)
COVER CLAMP SIL LG PBX B (MISCELLANEOUS) ×6 IMPLANT
DRAPE INCISE IOBAN 66X45 STRL (DRAPES) ×3 IMPLANT
DRAPE LAPAROTOMY 100X77 ABD (DRAPES) ×3 IMPLANT
DRSG OPSITE POSTOP 4X10 (GAUZE/BANDAGES/DRESSINGS) IMPLANT
DRSG OPSITE POSTOP 4X8 (GAUZE/BANDAGES/DRESSINGS) IMPLANT
ELECT CAUTERY NEEDLE TIP 1.0 (MISCELLANEOUS) ×3
ELECT REM PT RETURN 9FT ADLT (ELECTROSURGICAL) ×3
ELECTRODE CAUTERY NEDL TIP 1.0 (MISCELLANEOUS) ×1 IMPLANT
ELECTRODE REM PT RTRN 9FT ADLT (ELECTROSURGICAL) ×1 IMPLANT
GAUZE SPONGE 4X4 12PLY STRL (GAUZE/BANDAGES/DRESSINGS) ×3 IMPLANT
GLOVE BIO SURGEON STRL SZ7.5 (GLOVE) ×12 IMPLANT
GLOVE INDICATOR 8.0 STRL GRN (GLOVE) ×12 IMPLANT
GOWN STRL REUS W/ TWL LRG LVL3 (GOWN DISPOSABLE) ×6 IMPLANT
GOWN STRL REUS W/TWL LRG LVL3 (GOWN DISPOSABLE) ×12
HANDLE YANKAUER SUCT BULB TIP (MISCELLANEOUS) ×3 IMPLANT
KIT RM TURNOVER STRD PROC AR (KITS) ×3 IMPLANT
LABEL OR SOLS (LABEL) ×3 IMPLANT
LIGASURE MARYLAND LAP STAND (ELECTROSURGICAL) IMPLANT
NS IRRIG 1000ML POUR BTL (IV SOLUTION) ×3 IMPLANT
PACK BASIN MAJOR ARMC (MISCELLANEOUS) ×3 IMPLANT
PACK COLON CLEAN CLOSURE (MISCELLANEOUS) ×3 IMPLANT
PAD ABD DERMACEA PRESS 5X9 (GAUZE/BANDAGES/DRESSINGS) IMPLANT
RETAINER VISCERA MED (MISCELLANEOUS) ×3 IMPLANT
SPONGE LAP 18X18 5 PK (GAUZE/BANDAGES/DRESSINGS) ×3 IMPLANT
STAPLER PROXIMATE 75MM BLUE (STAPLE) ×3 IMPLANT
STAPLER SKIN PROX 35W (STAPLE) ×3 IMPLANT
STRIP CLOSURE SKIN 1/2X4 (GAUZE/BANDAGES/DRESSINGS) IMPLANT
SUT BOLSTER W/RETENTN TUBE (SUTURE) IMPLANT
SUT ETHILON 3-0 FS-10 30 BLK (SUTURE)
SUT MAXON ABS #0 GS21 30IN (SUTURE) IMPLANT
SUT NYLON 2-0 (SUTURE) IMPLANT
SUT PDS AB 1 TP1 96 (SUTURE) ×9 IMPLANT
SUT PROLENE 2 TP 1 (SUTURE) IMPLANT
SUT SILK 2 0 (SUTURE)
SUT SILK 2-0 18XBRD TIE 12 (SUTURE) IMPLANT
SUT SILK 3 0 (SUTURE)
SUT SILK 3-0 (SUTURE) ×7 IMPLANT
SUT SILK 3-0 18XBRD TIE 12 (SUTURE) IMPLANT
SUT SILK 3-0 RB1 30XBRD (SUTURE) ×2
SUT SILK 3-0 SH-1 18XCR BRD (SUTURE)
SUT SILK 4 0 (SUTURE)
SUT SILK 4-0 18XBRD TIE 12 (SUTURE) IMPLANT
SUT VIC AB 3-0 SH 27 (SUTURE) ×4
SUT VIC AB 3-0 SH 27X BRD (SUTURE) ×2 IMPLANT
SUT VIC AB 4-0 FS2 27 (SUTURE) IMPLANT
SUTURE EHLN 3-0 FS-10 30 BLK (SUTURE) IMPLANT
SUTURE SILK 3-0 RB1 30XBRD (SUTURE) ×2 IMPLANT
SUTURE SILK 3-0 SH-1 18XCR BRD (SUTURE) IMPLANT

## 2016-07-16 NOTE — Transfer of Care (Signed)
Immediate Anesthesia Transfer of Care Note  Patient: Andrew Ibarra  Procedure(s) Performed: Procedure(s): COLON RESECTION (Right)  Patient Location: PACU  Anesthesia Type:General  Level of Consciousness: awake, alert  and oriented  Airway & Oxygen Therapy: Patient Spontanous Breathing and Patient connected to face mask oxygen  Post-op Assessment: Report given to RN and Post -op Vital signs reviewed and stable  Post vital signs: Reviewed and stable  Last Vitals:  Vitals:   07/16/16 0430 07/16/16 0940  BP: (!) 132/93 (!) 142/97  Pulse: 95 69  Resp: 20 11  Temp: 36.5 C 36.3 C    Last Pain:  Vitals:   07/16/16 0430  TempSrc: Oral  PainSc:       Patients Stated Pain Goal: 0 ([id number])  Complications: No apparent anesthesia complications

## 2016-07-16 NOTE — Anesthesia Procedure Notes (Signed)
Procedure Name: Intubation Date/Time: 07/16/2016 7:48 AM Performed by: Marsh Dolly Pre-anesthesia Checklist: Patient identified, Patient being monitored, Timeout performed, Emergency Drugs available and Suction available Patient Re-evaluated:Patient Re-evaluated prior to inductionOxygen Delivery Method: Circle system utilized Preoxygenation: Pre-oxygenation with 100% oxygen Intubation Type: IV induction Ventilation: Mask ventilation without difficulty Laryngoscope Size: 3 and Glidescope Grade View: Grade I Tube type: Oral Tube size: 7.5 mm Number of attempts: 1 Airway Equipment and Method: Stylet Placement Confirmation: ETT inserted through vocal cords under direct vision,  positive ETCO2 and breath sounds checked- equal and bilateral Secured at: 21 cm Tube secured with: Tape Dental Injury: Teeth and Oropharynx as per pre-operative assessment  Comments: preop loose tooth . Intact post intubation

## 2016-07-16 NOTE — Anesthesia Postprocedure Evaluation (Signed)
Anesthesia Post Note  Patient: Andrew Ibarra  Procedure(s) Performed: Procedure(s) (LRB): COLON RESECTION (Right)  Patient location during evaluation: PACU Anesthesia Type: General Level of consciousness: awake and alert Pain management: pain level controlled Vital Signs Assessment: post-procedure vital signs reviewed and stable Respiratory status: spontaneous breathing, nonlabored ventilation, respiratory function stable and patient connected to nasal cannula oxygen Cardiovascular status: blood pressure returned to baseline and stable Postop Assessment: no signs of nausea or vomiting Anesthetic complications: no    Last Vitals:  Vitals:   07/16/16 1025 07/16/16 1038  BP: (!) 140/96 (!) 146/88  Pulse: 78 69  Resp:    Temp: 36.6 C 36.4 C    Last Pain:  Vitals:   07/16/16 1038  TempSrc: Oral  PainSc:                  Precious Haws Tanesha Arambula

## 2016-07-16 NOTE — Progress Notes (Signed)

## 2016-07-16 NOTE — Anesthesia Preprocedure Evaluation (Signed)
Anesthesia Evaluation  Patient identified by MRN, date of birth, ID band Patient awake    Reviewed: Allergy & Precautions, H&P , NPO status , Patient's Chart, lab work & pertinent test results  History of Anesthesia Complications Negative for: history of anesthetic complications  Airway Mallampati: III  TM Distance: >3 FB Neck ROM: full    Dental no notable dental hx. (+) Poor Dentition, Chipped, Loose Right top lateral incisor is loose :   Pulmonary shortness of breath and with exertion, COPD, Current Smoker,    Pulmonary exam normal breath sounds clear to auscultation       Cardiovascular Exercise Tolerance: Good (-) angina(-) Past MI negative cardio ROS Normal cardiovascular exam Rhythm:regular Rate:Normal     Neuro/Psych  Headaches,  Neuromuscular disease negative psych ROS   GI/Hepatic negative GI ROS, Neg liver ROS,   Endo/Other  negative endocrine ROS  Renal/GU      Musculoskeletal   Abdominal   Peds  Hematology negative hematology ROS (+)   Anesthesia Other Findings Past Medical History: No date: Cancer ([place])     Comment: colon cancer No date: Headache     Comment: H/O No date: Loosening of tooth     Comment: top front No date: Shortness of breath dyspnea     Comment: on exertion  Past Surgical History: 03/06/2016: COLONOSCOPY WITH PROPOFOL N/A     Comment: Procedure: COLONOSCOPY WITH PROPOFOL;                Surgeon: Lucilla Lame, MD;  Location: [facility];  Service: Endoscopy;  Laterality:              N/A; 03/06/2016: POLYPECTOMY     Comment: Procedure: POLYPECTOMY;  Surgeon: Lucilla Lame,              MD;  Location: [facility];  Service:               Endoscopy;; 03/25/2016: PORTACATH PLACEMENT Right     Comment: Procedure: INSERTION PORT-A-CATH;  Surgeon:               [name], MD;  Location: [facility];                Service: General;  Laterality:  Right; 2001: Right foot surgery  BMI    Body Mass Index:  29.14 kg/m      Reproductive/Obstetrics negative OB ROS                             Anesthesia Physical Anesthesia Plan  ASA: III  Anesthesia Plan: General ETT   Post-op Pain Management:    Induction:   Airway Management Planned:   Additional Equipment:   Intra-op Plan:   Post-operative Plan:   Informed Consent: I have reviewed the patients History and Physical, chart, labs and discussed the procedure including the risks, benefits and alternatives for the proposed anesthesia with the patient or authorized representative who has indicated his/her understanding and acceptance.     Plan Discussed with: Anesthesiologist, CRNA and Surgeon  Anesthesia Plan Comments: (Consented for extraction of loose tooth PRN.  Patient voiced understanding.  )        Anesthesia Quick Evaluation

## 2016-07-16 NOTE — Op Note (Signed)
07/13/2016 - 07/16/2016  9:35 AM  PATIENT:  Andrew Ibarra  50 y.o. male  PRE-OPERATIVE DIAGNOSIS:  N/A  POST-OPERATIVE DIAGNOSIS:  N/A  PROCEDURE:  Procedure(s): COLON RESECTION (Right)  SURGEON:  Surgeon(s) and Role:    * Jeanie Cooks, MD - Primary    * Nestor Lewandowsky, MD - Assisting   ASSISTANTS: [place]   ANESTHESIA:   general  EBL:  Total I/O In: 1600 [I.V.:1600] Out: 250 [Urine:200; Blood:50]   DRAINS: none   LOCAL MEDICATIONS USED:  NONE   DISPOSITION OF SPECIMEN:  PATHOLOGY   DICTATION: .Dragon Dictation with the patient in the supine position and after induction of appropriate general anesthesia the patient's abdomen was prepped with Betadine and draped sterile towels. An alcohol wipe and Betadine impregnated Steri-Drape are utilized. A midline incision was made from the midepigastrium to the infraumbilical area and carried down through the subcutaneous tissue using the Bovie electrocautery. The midline fascia identified and opened length of the skin incision as was the peritoneum. There was significant amount of free ascitic fluid noted. The peritoneum was lined with small peritoneal implants of what was apparently adenocarcinoma. Small bowel is dilated. There was a large right upper quadrant mass adherent to the retroperitoneum. The small bowel is identified and run from ileocecal valve to the ligament of Treitz. There were 2 areas of significant adhesion but no significant obstruction. The bowel was freed in both places.  The distal bowel was identified. There was a large tumor mass in the descending colon but significantly redundant sigmoid colon disappearing into the pelvis. There didn't appear to be free pelvic implant in addition.  We elected proceed with a ileal oh distal colon bypass in order to provide the patient was some palliative relief. I chose the bypass over end ileostomy. The tubes were placed side-by-side small enterotomy made in each loop of bowel. Endo  GIA-75 stapling device was inserted into each loop of bowel and the stapler approximated and fired. No bleeding was noted just superior to be satisfactory. The resulting enterotomy was closed with 2 layers of suture beginning with a running suture of 3-0 Vicryl in a seromuscular suture of 3070 Interrupted. The repair appeared to be satisfactory. Bowel contents returned to anatomic position. The abdomen was irrigated. Clean closure was undertaken. Midline fascia was closed tightly using loop PDS #1 from each end tying in the middle and burying the knot. The skin was clipped sterile dressings applied. Patient was then returned to recovery room having tolerated the procedure well. Sponge and needle count were correct 2 in the left.  PLAN OF CARE: Admit to inpatient   PATIENT DISPOSITION:  PACU - hemodynamically stable.   [name], MD

## 2016-07-16 NOTE — Progress Notes (Signed)
Discussed with Dr. Pat Patrick who will be taking over care.

## 2016-07-17 DIAGNOSIS — R0602 Shortness of breath: Secondary | ICD-10-CM

## 2016-07-17 DIAGNOSIS — C786 Secondary malignant neoplasm of retroperitoneum and peritoneum: Principal | ICD-10-CM

## 2016-07-17 DIAGNOSIS — Z79899 Other long term (current) drug therapy: Secondary | ICD-10-CM

## 2016-07-17 DIAGNOSIS — F17210 Nicotine dependence, cigarettes, uncomplicated: Secondary | ICD-10-CM

## 2016-07-17 DIAGNOSIS — Z9221 Personal history of antineoplastic chemotherapy: Secondary | ICD-10-CM

## 2016-07-17 DIAGNOSIS — C189 Malignant neoplasm of colon, unspecified: Secondary | ICD-10-CM

## 2016-07-17 DIAGNOSIS — R531 Weakness: Secondary | ICD-10-CM

## 2016-07-17 DIAGNOSIS — K56609 Unspecified intestinal obstruction, unspecified as to partial versus complete obstruction: Secondary | ICD-10-CM

## 2016-07-17 DIAGNOSIS — R109 Unspecified abdominal pain: Secondary | ICD-10-CM

## 2016-07-17 DIAGNOSIS — R5383 Other fatigue: Secondary | ICD-10-CM

## 2016-07-17 DIAGNOSIS — R630 Anorexia: Secondary | ICD-10-CM

## 2016-07-17 LAB — BASIC METABOLIC PANEL
ANION GAP: 5 (ref 5–15)
BUN: 11 mg/dL (ref 6–20)
CALCIUM: 8.2 mg/dL — AB (ref 8.9–10.3)
CO2: 27 mmol/L (ref 22–32)
Chloride: 102 mmol/L (ref 101–111)
Creatinine, Ser: 0.86 mg/dL (ref 0.61–1.24)
GFR calc Af Amer: >60 mL/min (ref >60)
GLUCOSE: 127 mg/dL — AB (ref 65–99)
Potassium: 4.5 mmol/L (ref 3.5–5.1)
Sodium: 134 mmol/L — ABNORMAL LOW (ref 135–145)

## 2016-07-17 LAB — CBC
HCT: 38.8 % — ABNORMAL LOW (ref 40.0–52.0)
Hemoglobin: 13.2 g/dL (ref 13.0–18.0)
MCH: 28.8 pg (ref 26.0–34.0)
MCHC: 33.9 g/dL (ref 32.0–36.0)
MCV: 84.9 fL (ref 80.0–100.0)
PLATELETS: 182 10*3/uL (ref 150–440)
RBC: 4.57 MIL/uL (ref 4.40–5.90)
RDW: 19.1 % — AB (ref 11.5–14.5)
WBC: 15.6 10*3/uL — AB (ref 3.8–10.6)

## 2016-07-17 MED ORDER — NICOTINE 21 MG/24HR TD PT24
21.0000 mg | MEDICATED_PATCH | Freq: Every day | TRANSDERMAL | Status: DC
  Administered 2016-07-17 – 2016-07-20 (×4): 21 mg via TRANSDERMAL
  Filled 2016-07-17 (×6): qty 1

## 2016-07-17 NOTE — Plan of Care (Signed)
Problem: Pain Management: Goal: General experience of comfort will improve Outcome: Not Progressing Continuing to require frequent pain meds with moderate effectiveness.

## 2016-07-17 NOTE — Progress Notes (Signed)
Subjective:   He is doing well 24 hours after surgery. He's passed some gas. He has moderate incisional pain but overall feels relatively well. His white blood cell count is up a bit at 15,000 but his hemoglobin is stable at 13. Does not have any nausea or vomiting. He is tolerating liquids.  Vital signs in last 24 hours: Temp:  [97.1 F (36.2 C)-98.1 F (36.7 C)] 97.1 F (36.2 C) (11/02 1207) Pulse Rate:  [88-99] 91 (11/02 1207) Resp:  [20] 20 (11/02 1207) BP: (160-175)/(80-94) 175/80 (11/02 1300) SpO2:  [97 %-99 %] 99 % (11/02 1207) Weight:  [93.2 kg (205 lb 8 oz)] 93.2 kg (205 lb 8 oz) (11/02 0435) Last BM Date: 07/15/16  Intake/Output from previous day: 11/01 0701 - 11/02 0700 In: 3522 [I.V.:3522] Out: [id number] [Urine:1225; Blood:50]  Exam:  He has minimal wound drainage. He has hypoactive but present bowel sounds.  Lab Results:  CBC  Recent Labs  07/17/16 0504  WBC 15.6*  HGB 13.2  HCT 38.8*  PLT 182   CMP     Component Value Date/Time   NA 134 (L) 07/17/2016 0504   K 4.5 07/17/2016 0504   CL 102 07/17/2016 0504   CO2 27 07/17/2016 0504   GLUCOSE 127 (H) 07/17/2016 0504   BUN 11 07/17/2016 0504   CREATININE 0.86 07/17/2016 0504   CALCIUM 8.2 (L) 07/17/2016 0504   PROT 8.4 (H) 07/13/2016 0316   ALBUMIN 3.8 07/13/2016 0316   AST 20 07/13/2016 0316   ALT 9 (L) 07/13/2016 0316   ALKPHOS 94 07/13/2016 0316   BILITOT 0.8 07/13/2016 0316   GFRNONAA >60 07/17/2016 0504   GFRAA >60 07/17/2016 0504   PT/INR No results for input(s): LABPROT, INR in the last 72 hours.  Studies/Results: No results found.  Assessment/Plan: He is doing well overall. We will advance his diet and increase his activity level.

## 2016-07-17 NOTE — Consult Note (Signed)
[facility]  [phone] [phone]  ID: Andrew Ibarra OB: 1966-06-09  MR#: [id number]  [id number]  Patient Care Team: Gunnar Bulla as PCP - General (Physician Assistant) Clent Jacks, RN as Registered Nurse Clayburn Pert, MD as Consulting Physician (General Surgery)  CHIEF COMPLAINT: Progressive stage IV colon cancer with omental caking, small bowel obstruction, abdominal pain.  INTERVAL HISTORY: Patient is a 50 year old male who last received chemotherapy on June 18, 2016. He presented to the emergency room with increasing abdominal pain. Subsequent workup revealed a partial small bowel obstruction requiring surgery. CT scan also noticed progressive disease. Patient continues to have incisional pain from his surgery, but otherwise feels well. He has no neurologic complaints. He denies any fevers. He has a poor appetite, but denies weight loss. He denies any nausea, vomiting, constipation, or diarrhea. He has no urinary complaints. Patient offers no further specific complaints.  REVIEW OF SYSTEMS:   Review of Systems  Constitutional: Positive for malaise/fatigue. Negative for fever and weight loss.  Respiratory: Negative.  Negative for cough and shortness of breath.   Cardiovascular: Negative.  Negative for leg swelling.  Gastrointestinal: Positive for abdominal pain. Negative for blood in stool, constipation, diarrhea, melena, nausea and vomiting.  Genitourinary: Negative.   Musculoskeletal: Negative.   Neurological: Positive for weakness.  Psychiatric/Behavioral: Negative.     As per HPI. Otherwise, a complete review of systems is negative.  PAST MEDICAL HISTORY: Past Medical History:  Diagnosis Date  . Cancer [facility])    colon cancer  . Headache    H/O  . Loosening of tooth    top front  . Shortness of breath dyspnea    on exertion    PAST SURGICAL HISTORY: Past Surgical History:  Procedure Laterality Date   . COLON RESECTION Right 07/16/2016   Procedure: COLON RESECTION;  Surgeon: [name], MD;  Location: [facility];  Service: General;  Laterality: Right;  . COLONOSCOPY WITH PROPOFOL N/A 03/06/2016   Procedure: COLONOSCOPY WITH PROPOFOL;  Surgeon: Lucilla Lame, MD;  Location: [facility];  Service: Endoscopy;  Laterality: N/A;  . POLYPECTOMY  03/06/2016   Procedure: POLYPECTOMY;  Surgeon: Lucilla Lame, MD;  Location: [facility];  Service: Endoscopy;;  . PORTACATH PLACEMENT Right 03/25/2016   Procedure: INSERTION PORT-A-CATH;  Surgeon: [name], MD;  Location: [facility];  Service: General;  Laterality: Right;  . Right foot surgery  2001    FAMILY HISTORY: Family History  Problem Relation Age of Onset  . Pancreatic cancer Maternal Grandfather   . Lymphoma Maternal Grandmother   . CAD Father   . Heart disease Father     ADVANCED DIRECTIVES (Y/N):  @ADVDIR @  HEALTH MAINTENANCE: Social History  Substance Use Topics  . Smoking status: Current Every Day Smoker    Packs/day: 0.50    Years: 35.00    Types: Cigarettes  . Smokeless tobacco: Never Used     Comment: has more than 30 pack years history  . Alcohol use No     Colonoscopy:  PAP:  Bone density:  Lipid panel:  No Known Allergies  Current Facility-Administered Medications  Medication Dose Route Frequency Provider Last Rate Last Dose  . acetaminophen (TYLENOL) suppository 650 mg  650 mg Rectal Q4H PRN Srikar Sudini, MD      . albuterol (PROVENTIL) (2.5 MG/3ML) 0.083% nebulizer solution 2.5 mg  2.5 mg Nebulization Q2H PRN Srikar Sudini, MD      . amLODipine (NORVASC) tablet 5  mg  5 mg Oral Daily Hillary [place], MD   5 mg at 07/17/16 [id number]  . enoxaparin (LOVENOX) injection 40 mg  40 mg Subcutaneous Q24H Hillary [place], MD   40 mg at 07/17/16 1150  . hydrALAZINE (APRESOLINE) injection 10 mg  10 mg Intravenous Q6H PRN Srikar Sudini, MD      . HYDROmorphone (DILAUDID) injection 1 mg  1 mg Intravenous Q2H PRN [name], MD   1 mg at 07/17/16 0646  . lactated ringers infusion   Intravenous Continuous Hillary [place], MD 100 mL/hr at 07/17/16 2128    . LORazepam (ATIVAN) injection 0.5 mg  0.5 mg Intravenous Q6H PRN Srikar Sudini, MD   0.5 mg at 07/17/16 1755  . nicotine (NICODERM CQ - dosed in mg/24 hours) patch 21 mg  21 mg Transdermal Daily Pershing Proud, NP   21 mg at 07/17/16 0929  . ondansetron (ZOFRAN) tablet 4 mg  4 mg Oral Q6H PRN Hillary [place], MD       Or  . ondansetron (ZOFRAN) injection 4 mg  4 mg Intravenous Q6H PRN Hillary Bow, MD   4 mg at 07/16/16 0751  . oxyCODONE-acetaminophen (PERCOCET/ROXICET) 5-325 MG per tablet 1-2 tablet  1-2 tablet Oral Q6H PRN [name], MD   1 tablet at 07/17/16 2216  . sodium chloride flush (NS) 0.9 % injection 3 mL  3 mL Intravenous Q12H Srikar Sudini, MD   3 mL at 07/17/16 2117   Facility-Administered Medications Ordered in Other Encounters  Medication Dose Route Frequency Provider Last Rate Last Dose  . 0.9 %  sodium chloride infusion   Intravenous Once Lloyd Huger, MD      . heparin lock flush 100 unit/mL  500 Units Intracatheter Once PRN Lloyd Huger, MD      . sodium chloride flush (NS) 0.9 % injection 10 mL  10 mL Intravenous PRN Lloyd Huger, MD   10 mL at 04/09/16 1014    OBJECTIVE: Vitals:   07/17/16 2247 07/17/16 2254  BP: (!) 159/84 (!) 163/94  Pulse: 94 96  Resp: 18 18  Temp: 98.6 F (37 C) 97.8 F (36.6 C)     Body mass index is 30.35 kg/m.    ECOG FS:1 - Symptomatic but completely ambulatory  General: Well-developed, well-nourished, no acute distress. Eyes: Pink conjunctiva, anicteric sclera. HEENT: Normocephalic, moist mucous membranes, clear oropharnyx. Lungs: Clear to auscultation bilaterally. Heart: Regular rate and rhythm. No rubs, murmurs, or gallops. Abdomen: Soft, nontender, nondistended. Healing midline surgical scar. Musculoskeletal: No edema, cyanosis, or clubbing. Neuro: Alert, answering all  questions appropriately. Cranial nerves grossly intact. Skin: No rashes or petechiae noted. Psych: Normal affect. Lymphatics: No cervical, calvicular, axillary or inguinal LAD.   LAB RESULTS:  Lab Results  Component Value Date   NA 134 (L) 07/17/2016   K 4.5 07/17/2016   CL 102 07/17/2016   CO2 27 07/17/2016   GLUCOSE 127 (H) 07/17/2016   BUN 11 07/17/2016   CREATININE 0.86 07/17/2016   CALCIUM 8.2 (L) 07/17/2016   PROT 8.4 (H) 07/13/2016   ALBUMIN 3.8 07/13/2016   AST 20 07/13/2016   ALT 9 (L) 07/13/2016   ALKPHOS 94 07/13/2016   BILITOT 0.8 07/13/2016   GFRNONAA >60 07/17/2016   GFRAA >60 07/17/2016    Lab Results  Component Value Date   WBC 15.6 (H) 07/17/2016   NEUTROABS 1.6 06/18/2016   HGB 13.2 07/17/2016   HCT 38.8 (L) 07/17/2016   MCV  84.9 07/17/2016   PLT 182 07/17/2016   Lab Results  Component Value Date   CEA 3.1 02/18/2016     STUDIES: Dg Abd 1 View  Result Date: 07/13/2016 CLINICAL DATA:  Enteric tube placement EXAM: ABDOMEN - 1 VIEW COMPARISON:  CT abdomen/ pelvis from earlier today FINDINGS: Enteric tube terminates in the very proximal stomach with the side port in the lower thoracic esophagus. Dilated small bowel loops are again noted throughout the abdomen. Evidence of pneumatosis or pneumoperitoneum. Clear lung bases. IMPRESSION: Enteric tube terminates in the very proximal stomach with the side port in the lower thoracic esophagus, consider advancing 4-5 cm. Re- demonstration of findings of small bowel/proximal large bowel obstruction. Electronically Signed   By: Ilona Sorrel M.D.   On: 07/13/2016 11:02   Ct Abdomen Pelvis W Contrast  Result Date: 07/13/2016 CLINICAL DATA:  Abdomen and pelvis pain and distension for the past 3 days. Nausea and vomiting during that time. Stage IV colon cancer. EXAM: CT ABDOMEN AND PELVIS WITH CONTRAST TECHNIQUE: Multidetector CT imaging of the abdomen and pelvis was performed using the standard protocol following  bolus administration of intravenous contrast. CONTRAST:  129mL ISOVUE-300 IOPAMIDOL (ISOVUE-300) INJECTION 61% COMPARISON:  Chest and abdomen radiographs dated 04/23/2016. PET-CT dated 03/04/2016 and abdomen and pelvis CT dated 02/18/2016. FINDINGS: Lower chest: Mild bibasilar linear atelectasis. Hepatobiliary: Interval peritoneal masses and associated fluid indenting into the dome of the liver. No intrahepatic masses are seen. Unremarkable gallbladder. Pancreas: Unremarkable. No pancreatic ductal dilatation or surrounding inflammatory changes. Spleen: Normal in size without focal abnormality. Adrenals/Urinary Tract: Adrenal glands are unremarkable. Kidneys are normal, without renal calculi, focal lesion, or hydronephrosis. Bladder is unremarkable. Stomach/Bowel: Interval multiple dilated proximal small bowel loops with normal caliber distal loops. The right colon is also dilated. There is an hepatic flexure colon mass measuring 10.4 cm in length and 2.2 cm in maximum thickness. The stomach is dilated. The mid and distal colon are normal in caliber. There is concentric wall thickening involving the distal rectum with a maximum thickness of 1.2 cm, not present on 02/18/2016 where there was better distention of the rectum. No evidence of appendicitis. Vascular/Lymphatic: Atheromatous arterial calcifications, including mild calcifications in the abdominal aorta. Duplicated inferior vena cava multiple mesenteric masses with progression. Some of these could represent mesenteric nodes. Reproductive: Normal sized prostate gland. Other: Small amount of free peritoneal fluid with improvement. Small bilateral inguinal hernias containing fat. Musculoskeletal: Lumbar and thoracic spine degenerative changes. No evidence of bony metastatic disease. IMPRESSION: 1. Partial small bowel obstruction. This could be due to an obstructing peritoneal metastasis or adhesion. 2. Significant increase in size of a colon mass at the hepatic  flexure, causing partial obstruction of the right colon. This is compatible with the patient's known primary colon carcinoma. 3. Progressive peritoneal metastatic disease. 4. Decreased malignant ascites. 5. Concentric wall thickening involving the distal rectum. This could be due to incomplete distention of the rectum or interval proctitis. A rectal mass is also a possibility. 6. Aortic atherosclerosis. Electronically Signed   By: Claudie Revering M.D.   On: 07/13/2016 07:57    ASSESSMENT: Progressive stage IV colon cancer with omental caking, small bowel obstruction, abdominal pain.   PLAN:    1. 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 Patient last received chemotherapy with FOLFOX plus Avastin on June 18, 2016. Patient's pathology results has been sent for PDL 1 testing to assess whether immunotherapy is an option. Patient  expressed interest in continuing aggressive treatment. Please have patient follow-up in the [facility] in approximately one week for further evaluation and treatment planning. 2. Pain: Continue current narcotic regimen as prescribed. 3. Anxiety: Continue Xanax as needed. Previously, patient was given a referral to psychiatry for further evaluation which she refused. 4. Disposition: Possible discharge in the next 2-3 days.  Appreciate consult. I will be out of town until Monday, July 21, 2016. Please call the on-call physician with any questions or concerns.   Lloyd Huger, MD   07/17/2016 11:31 PM

## 2016-07-17 NOTE — Progress Notes (Signed)
[place] made a follow-up visit with Pt, when the Nurse requested for the visit. Pt was anxious and very discouraged with the report the Doctors gave him today. Pt had poor prognosis report that left him depressed. Pt talked to [facility] about his desire to be right with God and asked [place] to give him something that would help lift his faith. [facility] gave the Pt as shawl, and also prayed for the Pt. Pt requested for baptist, which [place] promised to do for him tomorrow. Pt was thankful for the support and prayers the chaplains offered him.     07/17/16 1600  Clinical Encounter Type  Visited With Patient  Visit Type Follow-up;Spiritual support  Spiritual Encounters  Spiritual Needs Prayer;Ritual;Emotional  Stress Factors  Patient Stress Factors Major life changes  Family Stress Factors Major life changes

## 2016-07-17 NOTE — Plan of Care (Signed)
Problem: Pain Managment: Goal: General experience of comfort will improve Outcome: Progressing Pt needed PRN pain meds only one time during day shift

## 2016-07-18 ENCOUNTER — Inpatient Hospital Stay: Payer: Commercial Managed Care - PPO

## 2016-07-18 MED ORDER — ENSURE ENLIVE PO LIQD
237.0000 mL | Freq: Two times a day (BID) | ORAL | Status: DC
  Administered 2016-07-19 – 2016-07-22 (×5): 237 mL via ORAL

## 2016-07-18 NOTE — Progress Notes (Signed)
Subjective:   He continues to feel better. He had some flatus last night. He's tolerating liquid diet. He has some moderate incisional pain but otherwise no significant abdominal symptoms. He remains afebrile.  Vital signs in last 24 hours: Temp:  [97.1 F (36.2 C)-98.6 F (37 C)] 98.3 F (36.8 C) (11/03 0510) Pulse Rate:  [87-102] 90 (11/03 0510) Resp:  [14-20] 18 (11/03 0510) BP: (147-175)/(80-95) 147/84 (11/03 0510) SpO2:  [96 %-99 %] 98 % (11/03 0510) Weight:  [90.3 kg (199 lb 1.6 oz)] 90.3 kg (199 lb 1.6 oz) (11/03 0602) Last BM Date: 07/15/16  Intake/Output from previous day: 11/02 0701 - 11/03 0700 In: 1999 [P.O.:120; I.V.:1879] Out: 4700 [Urine:4700]  Exam:  His wound looks good. There is no sign of any infection. He has active bowel sounds.  Lab Results:  CBC  Recent Labs  07/17/16 0504  WBC 15.6*  HGB 13.2  HCT 38.8*  PLT 182   CMP     Component Value Date/Time   NA 134 (L) 07/17/2016 0504   K 4.5 07/17/2016 0504   CL 102 07/17/2016 0504   CO2 27 07/17/2016 0504   GLUCOSE 127 (H) 07/17/2016 0504   BUN 11 07/17/2016 0504   CREATININE 0.86 07/17/2016 0504   CALCIUM 8.2 (L) 07/17/2016 0504   PROT 8.4 (H) 07/13/2016 0316   ALBUMIN 3.8 07/13/2016 0316   AST 20 07/13/2016 0316   ALT 9 (L) 07/13/2016 0316   ALKPHOS 94 07/13/2016 0316   BILITOT 0.8 07/13/2016 0316   GFRNONAA >60 07/17/2016 0504   GFRAA >60 07/17/2016 0504   PT/INR No results for input(s): LABPROT, INR in the last 72 hours.  Studies/Results: No results found.  Assessment/Plan: He is continuing to improve. We'll advance his diet and activity level. We anticipate discharge in the next 48 hours to be continues to improve at this rate.

## 2016-07-18 NOTE — Progress Notes (Signed)
[place] visited the Pt, as he had promised to follow-up with the Pt's request for baptist. [place] conducted a baptism service the in the Pt's room and baptized the Pt according to his wishes in the presence of two other Ch's,  two nurse, and the Pt's aunty. The Pt was very thankful that the [facility] were willing to honor his request to be baptized at the hospital. Maudie Flakes were also offered for the Pt's healing as well.    07/18/16 1300  Clinical Encounter Type  Visited With Patient and family together  Visit Type Follow-up;Spiritual support  Referral From Patient;Family  Consult/Referral To Chaplain  Spiritual Encounters  Spiritual Needs Brochure;Prayer;Ritual  Stress Factors  Patient Stress Factors Major life changes;Other (Comment)  Family Stress Factors Other (Comment)

## 2016-07-18 NOTE — Progress Notes (Signed)
Nutrition Follow-up  DOCUMENTATION CODES:   Severe malnutrition in context of chronic illness  INTERVENTION:  -Recommend adding ensure enlive BID for added nutrition. MD currently in surgery. Encouraged pt and family to ask Dr. Pat Patrick.     NUTRITION DIAGNOSIS:   Malnutrition related to chronic illness as evidenced by moderate to severe fluid accumulation, percent weight loss, severe depletion of muscle mass, moderate depletion of body fat.  ongoing  GOAL:   Patient will meet greater than or equal to 90% of their needs  Not currently meeting nutritional needs but intake improving  MONITOR:   Diet advancement, Labs, Weight trends  REASON FOR ASSESSMENT:   Consult Assessment of nutrition requirement/status  ASSESSMENT:     Pt s/p colon resection.    Pt reports ate some of eggs, drank milk and ate some of jelly biscuit this am.  Did not eat any lunch as trying to take it easy. "Dr Pat Patrick told me to take it easy on eating." Family asking about ensure and encouraged to discuss with MD Pat Patrick.  Medications reviewed Labs reviewed:  Diet Order:  DIET SOFT Room service appropriate? Yes; Fluid consistency: Thin  Skin:  Reviewed, no issues  Last BM:  10/25, flatus noted  Height:   Ht Readings from Last 1 Encounters:  07/13/16 5\' 9"  (1.753 m)    Weight:   Wt Readings from Last 1 Encounters:  07/18/16 199 lb 1.6 oz (90.3 kg)    Ideal Body Weight:     BMI:  Body mass index is 29.4 kg/m.  Estimated Nutritional Needs:   Kcal:  [id number] kcals  Protein:  115-130 g  Fluid:  >/= 2.3 L  EDUCATION NEEDS:   No education needs identified at this time  Andrew Ibarra, [place], [place] (pager) Weekend/On-Call pager [phone])

## 2016-07-18 NOTE — Plan of Care (Signed)
Problem: Skin Integrity: Goal: Risk for impaired skin integrity will decrease Outcome: Progressing Midline incision without continued bleeding; staples intact

## 2016-07-19 LAB — CBC
HEMATOCRIT: 35.7 % — AB (ref 40.0–52.0)
Hemoglobin: 12.4 g/dL — ABNORMAL LOW (ref 13.0–18.0)
MCH: 29.4 pg (ref 26.0–34.0)
MCHC: 34.7 g/dL (ref 32.0–36.0)
MCV: 84.9 fL (ref 80.0–100.0)
Platelets: 187 10*3/uL (ref 150–440)
RBC: 4.21 MIL/uL — ABNORMAL LOW (ref 4.40–5.90)
RDW: 19 % — AB (ref 11.5–14.5)
WBC: 10 10*3/uL (ref 3.8–10.6)

## 2016-07-19 LAB — BASIC METABOLIC PANEL
Anion gap: 9 (ref 5–15)
BUN: 10 mg/dL (ref 6–20)
CALCIUM: 8 mg/dL — AB (ref 8.9–10.3)
CO2: 23 mmol/L (ref 22–32)
CREATININE: 0.54 mg/dL — AB (ref 0.61–1.24)
Chloride: 100 mmol/L — ABNORMAL LOW (ref 101–111)
GFR calc non Af Amer: >60 mL/min (ref >60)
Glucose, Bld: 92 mg/dL (ref 65–99)
Potassium: 3.6 mmol/L (ref 3.5–5.1)
SODIUM: 132 mmol/L — AB (ref 135–145)

## 2016-07-19 NOTE — Progress Notes (Signed)
Subjective:   He continues to improve. He was able tolerate some soft diet yesterday and is having some flatus but no bowel movement as yet. He has only incisional pain. He's been up and active without any other complaints.  Vital signs in last 24 hours: Temp:  [97.5 F (36.4 C)-98.1 F (36.7 C)] 98.1 F (36.7 C) (11/04 0549) Pulse Rate:  [99-106] 99 (11/04 0549) Resp:  [16-20] 20 (11/04 0549) BP: (149-156)/(86-103) 156/86 (11/04 0549) SpO2:  [96 %] 96 % (11/04 0549) Weight:  [89.9 kg (198 lb 1.6 oz)] 89.9 kg (198 lb 1.6 oz) (11/04 0125) Last BM Date: 07/15/16  Intake/Output from previous day: 11/03 0701 - 11/04 0700 In: 1511.2 [P.O.:480; I.V.:1031.2] Out: 1450 [Urine:1450]  Exam:  His wound looks good with no evidence of any infection. Dressing was removed.  Lab Results:  CBC  Recent Labs  07/17/16 0504  WBC 15.6*  HGB 13.2  HCT 38.8*  PLT 182   CMP     Component Value Date/Time   NA 134 (L) 07/17/2016 0504   K 4.5 07/17/2016 0504   CL 102 07/17/2016 0504   CO2 27 07/17/2016 0504   GLUCOSE 127 (H) 07/17/2016 0504   BUN 11 07/17/2016 0504   CREATININE 0.86 07/17/2016 0504   CALCIUM 8.2 (L) 07/17/2016 0504   PROT 8.4 (H) 07/13/2016 0316   ALBUMIN 3.8 07/13/2016 0316   AST 20 07/13/2016 0316   ALT 9 (L) 07/13/2016 0316   ALKPHOS 94 07/13/2016 0316   BILITOT 0.8 07/13/2016 0316   GFRNONAA >60 07/17/2016 0504   GFRAA >60 07/17/2016 0504   PT/INR No results for input(s): LABPROT, INR in the last 72 hours.  Studies/Results: No results found.  Assessment/Plan: We will increase his activity and his diet level. I like to keep him in the hospital has a bowel movement so that we can assure is not obstructed. Overall his progress is been excellent.

## 2016-07-20 LAB — CREATININE, SERUM
Creatinine, Ser: 0.47 mg/dL — ABNORMAL LOW (ref 0.61–1.24)
GFR calc non Af Amer: >60 mL/min (ref >60)

## 2016-07-20 LAB — HEMOGLOBIN: HEMOGLOBIN: 13.1 g/dL (ref 13.0–18.0)

## 2016-07-20 MED ORDER — LORAZEPAM 0.5 MG PO TABS
0.5000 mg | ORAL_TABLET | Freq: Every evening | ORAL | Status: DC | PRN
  Administered 2016-07-21 – 2016-07-22 (×3): 0.5 mg via ORAL
  Filled 2016-07-20 (×3): qty 1

## 2016-07-20 MED ORDER — ZOLPIDEM TARTRATE 5 MG PO TABS: 5.0000 mg | ORAL_TABLET | Freq: Every evening | ORAL | Status: DC | PRN

## 2016-07-20 NOTE — Progress Notes (Signed)
Subjective:   He continues to improve. He is passing some gas postop had a bowel movement as yet. His vital signs remained stable and is tolerating a soft diet. He has minimal complaints of pain  Vital signs in last 24 hours: Temp:  [98.1 F (36.7 C)-98.2 F (36.8 C)] 98.2 F (36.8 C) (11/05 0528) Pulse Rate:  [93-103] 93 (11/05 0528) Resp:  [16-20] 16 (11/05 0528) BP: (139-159)/(92-100) 139/92 (11/05 0528) SpO2:  [93 %-98 %] 93 % (11/05 0528) Weight:  [89.4 kg (197 lb 3.2 oz)] 89.4 kg (197 lb 3.2 oz) (11/05 0547) Last BM Date: 07/15/16  Intake/Output from previous day: 11/04 0701 - 11/05 0700 In: [id number] [I.V.:1557] Out: 1050 [Urine:1050]  Exam:  Abdomen is soft looks good with no evidence of any infection. He does have active bowel sounds.  Lab Results:  CBC  Recent Labs  07/19/16 1250 07/20/16 0539  WBC 10.0  --   HGB 12.4* 13.1  HCT 35.7*  --   PLT 187  --    CMP     Component Value Date/Time   NA 132 (L) 07/19/2016 1250   K 3.6 07/19/2016 1250   CL 100 (L) 07/19/2016 1250   CO2 23 07/19/2016 1250   GLUCOSE 92 07/19/2016 1250   BUN 10 07/19/2016 1250   CREATININE 0.47 (L) 07/20/2016 0539   CALCIUM 8.0 (L) 07/19/2016 1250   PROT 8.4 (H) 07/13/2016 0316   ALBUMIN 3.8 07/13/2016 0316   AST 20 07/13/2016 0316   ALT 9 (L) 07/13/2016 0316   ALKPHOS 94 07/13/2016 0316   BILITOT 0.8 07/13/2016 0316   GFRNONAA >60 07/20/2016 0539   GFRAA >60 07/20/2016 0539   PT/INR No results for input(s): LABPROT, INR in the last 72 hours.  Studies/Results: No results found.  Assessment/Plan: If we can get him to have some bowel function we can anticipate discharge. Normal reluctant to discharge him until were certain that the bypass is working properly. Pathology is still pending.

## 2016-07-21 ENCOUNTER — Inpatient Hospital Stay: Admit: 2016-07-21 | Payer: Commercial Managed Care - PPO

## 2016-07-21 LAB — SURGICAL PATHOLOGY

## 2016-07-21 MED ORDER — POLYETHYLENE GLYCOL 3350 17 G PO PACK
17.0000 g | PACK | Freq: Every day | ORAL | Status: DC
  Administered 2016-07-21 – 2016-07-22 (×2): 17 g via ORAL
  Filled 2016-07-21 (×3): qty 1

## 2016-07-21 MED ORDER — LACTULOSE 10 GM/15ML PO SOLN
30.0000 g | Freq: Two times a day (BID) | ORAL | Status: DC
  Filled 2016-07-21: qty 60

## 2016-07-21 NOTE — Progress Notes (Signed)
[place] made a follow-up visit with the Pt. Pt was with his wife, Macy Mis and another relative at the time of visit. CH and Pt had conversation on how to live as a follower of [place], and the Pt's concern about smoking cigarettes, which the Pt said, he missed. [place] reminded Pt that thie hospital is a cigarette free environment where cigarette smoking is not allowed, Pt understood and requested for prayers for his bowel to move so he can use the bathroom, which the Ch, provided.

## 2016-07-21 NOTE — Progress Notes (Signed)
Pt had just returned from outside and was in good spirits. Wife and friend also in room. CH is available.   07/21/16 1120  Clinical Encounter Type  Visited With Patient and family together  Visit Type Follow-up  Referral From Nurse  Spiritual Encounters  Spiritual Needs Emotional  Stress Factors  Patient Stress Factors None identified  Family Stress Factors None identified

## 2016-07-21 NOTE — Progress Notes (Signed)
50 year old male who has stage IV colon cancer with an descending colon obstruction, POD# 5 from ileocolonic bypass anastomosis. The patient has not had a bowel movement since his operation however he has been passing gas. He has been tolerating a regular diet as well. Patient is very frustrated at this time and does not understand the need to wait for a bowel movement.  Vitals:   07/21/16 0537 07/21/16 1442  BP: 129/84 131/80  Pulse: 90 99  Resp: 18 18  Temp: 98.3 F (36.8 C)    I/O last 3 completed shifts: In: 360 [P.O.:360] Out: 1200 [Urine:1200] No intake/output data recorded.   PE:  Gen: NAD Abd: soft, incision c/d/i, mildly distended, appopriately tender  CBC Latest Ref Rng & Units 07/20/2016 07/19/2016 07/17/2016  WBC 3.8 - 10.6 K/uL - 10.0 15.6(H)  Hemoglobin 13.0 - 18.0 g/dL 13.1 12.4(L) 13.2  Hematocrit 40.0 - 52.0 % - 35.7(L) 38.8(L)  Platelets 150 - 440 K/uL - 187 182   CMP Latest Ref Rng & Units 07/20/2016 07/19/2016 07/17/2016  Glucose 65 - 99 mg/dL - 92 127(H)  BUN 6 - 20 mg/dL - 10 11  Creatinine 0.61 - 1.24 mg/dL 0.47(L) 0.54(L) 0.86  Sodium 135 - 145 mmol/L - 132(L) 134(L)  Potassium 3.5 - 5.1 mmol/L - 3.6 4.5  Chloride 101 - 111 mmol/L - 100(L) 102  CO2 22 - 32 mmol/L - 23 27  Calcium 8.9 - 10.3 mg/dL - 8.0(L) 8.2(L)  Total Protein 6.5 - 8.1 g/dL - - -  Total Bilirubin 0.3 - 1.2 mg/dL - - -  Alkaline Phos 38 - 126 U/L - - -  AST 15 - 41 U/L - - -  ALT 17 - 63 U/L - - -     A/P:  50 year old male who has stage IV colon cancer with an descending colon obstruction, POD# 5 from ileocolonic bypass anastomosis.   Continue current pain regimen, diet regular, encouraged ambulation, started MiraLAX twice daily and will give lactulose today. If has bowel movement can go home tomorrow.

## 2016-07-22 MED ORDER — ACETAMINOPHEN 325 MG PO TABS
650.0000 mg | ORAL_TABLET | Freq: Four times a day (QID) | ORAL | Status: DC | PRN
  Administered 2016-07-22 – 2016-07-23 (×3): 650 mg via ORAL
  Filled 2016-07-22 (×3): qty 2

## 2016-07-22 MED ORDER — BISACODYL 10 MG RE SUPP: 10.0000 mg | Freq: Every day | RECTAL | Status: DC

## 2016-07-22 NOTE — Progress Notes (Signed)
50 year old male who has stage IV colon cancer with an descending colon obstruction, POD# 6 from ileocolonic bypass anastomosis. He states he is passing gas and feels good.  He is very frustrated that he has not had a BM.  He refuses to take the suppository or the lactulose to help with BM because he is afraid it may cause cramping pain.    Vitals:   07/22/16 0456 07/22/16 1148  BP: 139/88 126/83  Pulse: 93 86  Resp: 20   Temp: 98.4 F (36.9 C) 98.4 F (36.9 C)   I/O last 3 completed shifts: In: 362 [P.O.:359; I.V.:3] Out: 800 [Urine:800] No intake/output data recorded.   PE:  Gen: NAD Abd: soft, incision c/d/i, mildly distended, appopriately tender  CBC Latest Ref Rng & Units 07/20/2016 07/19/2016 07/17/2016  WBC 3.8 - 10.6 K/uL - 10.0 15.6(H)  Hemoglobin 13.0 - 18.0 g/dL 13.1 12.4(L) 13.2  Hematocrit 40.0 - 52.0 % - 35.7(L) 38.8(L)  Platelets 150 - 440 K/uL - 187 182   CMP Latest Ref Rng & Units 07/20/2016 07/19/2016 07/17/2016  Glucose 65 - 99 mg/dL - 92 127(H)  BUN 6 - 20 mg/dL - 10 11  Creatinine 0.61 - 1.24 mg/dL 0.47(L) 0.54(L) 0.86  Sodium 135 - 145 mmol/L - 132(L) 134(L)  Potassium 3.5 - 5.1 mmol/L - 3.6 4.5  Chloride 101 - 111 mmol/L - 100(L) 102  CO2 22 - 32 mmol/L - 23 27  Calcium 8.9 - 10.3 mg/dL - 8.0(L) 8.2(L)  Total Protein 6.5 - 8.1 g/dL - - -  Total Bilirubin 0.3 - 1.2 mg/dL - - -  Alkaline Phos 38 - 126 U/L - - -  AST 15 - 41 U/L - - -  ALT 17 - 63 U/L - - -     A/P:  50 year old male who has stage IV colon cancer with an descending colon obstruction, POD# 6 from ileocolonic bypass anastomosis.   Continue current pain regimen, diet regular, encouraged ambulation, started MiraLAX twice daily. Patient is willing to take suppository in AM. If has bowel movement can go home tomorrow.

## 2016-07-22 NOTE — Progress Notes (Signed)
[place] making rounds in unit 2C visited the Pt with whom he had worked with to provide spiritual support and prayers. Pt was anxious and distressed because he was not having bowel movement and requested for prayers for healing, which the Ch, provided.     07/22/16 1300  Clinical Encounter Type  Visited With Patient and family together  Visit Type Follow-up;Spiritual support  Spiritual Encounters  Spiritual Needs Prayer;Other (Comment)  Stress Factors  Patient Stress Factors None identified  Family Stress Factors None identified

## 2016-07-23 ENCOUNTER — Inpatient Hospital Stay: Payer: Commercial Managed Care - PPO

## 2016-07-23 ENCOUNTER — Inpatient Hospital Stay: Payer: Commercial Managed Care - PPO | Admitting: Oncology

## 2016-07-23 LAB — CREATININE, SERUM
CREATININE: 0.67 mg/dL (ref 0.61–1.24)
GFR calc Af Amer: >60 mL/min (ref >60)
GFR calc non Af Amer: >60 mL/min (ref >60)

## 2016-07-23 MED ORDER — OXYCODONE-ACETAMINOPHEN 5-325 MG PO TABS: 1.0000 | ORAL_TABLET | Freq: Four times a day (QID) | ORAL | 0 refills | Status: DC | PRN

## 2016-07-23 NOTE — Progress Notes (Signed)
[place] made a follow-up visit with Pt. Pt was with this wife in the room. Pt told [place] he was upset that the doctors has not signed off, for discharge. Discharge process, he said, was taking longer than he had expected and that disappointed the Pt. His wife was supposed to go to work tonight but that also was hugging in the air. [place] consoled the Pt and his wife and provided support and prayers. Pt will be going home sometime today.    07/23/16 1400  Clinical Encounter Type  Visited With Patient and family together  Visit Type Follow-up;Spiritual support  Referral From Nurse  Consult/Referral To Chaplain  Spiritual Encounters  Spiritual Needs Prayer;Other (Comment)  Stress Factors  Patient Stress Factors Other (Comment)  Family Stress Factors Other (Comment)

## 2016-07-23 NOTE — Progress Notes (Signed)
Met Pt's wife in hallway, excited because husband gets discharged today following bowel movement. CH is available.   07/23/16 0945  Clinical Encounter Type  Visited With Family  Visit Type Follow-up  Referral From Nurse  Spiritual Encounters  Spiritual Needs Emotional  Stress Factors  Family Stress Factors None identified

## 2016-07-23 NOTE — Progress Notes (Signed)
Patient requested to let MD know about bowel movement today. Primary RN notified MD. Pt resting in bed. Continue to assess.

## 2016-07-25 ENCOUNTER — Telehealth: Payer: Self-pay

## 2016-07-25 ENCOUNTER — Inpatient Hospital Stay: Payer: Commercial Managed Care - PPO

## 2016-07-25 NOTE — Telephone Encounter (Signed)
Patient called office today to say he was not provided a post operative appointment when discharge from the hospital on 07/23/16.  Appointment was provided for 07/30/16 @ 1:30 with Dr.Loflin. Patient verbalized understanding.

## 2016-07-26 NOTE — Discharge Summary (Signed)
Physician Discharge Summary  Patient ID: Andrew Ibarra MRN: [id number] DOB/AGE: 1966-03-21 50 y.o.  Admit date: 07/13/2016 Discharge date: 07/23/2016  Admission Diagnoses:  Colon cancer causing obstruction  Discharge Diagnoses:  Active Problems:   SBO (small bowel obstruction)   Partial small bowel obstruction   Protein-calorie malnutrition, severe   Colon adenocarcinoma ([place])   Palliative care encounter   DNR (do not resuscitate) discussion   Discharged Condition: fair  Hospital Course: 50 year old male who came in with a colonic obstruction due to stage IV colon cancer. The patient underwent a exploratory laparotomy with Dr. Pat Patrick on 11/1 and a ileocolonic anastomosis was created to bypass this cancer. The patient did well postoperatively and was able to be advanced to a regular diet. He had good pain control and remained in this state for 4-5 days while awaiting return of bowel function. The patient was able to have the stool and there was able to be discharged home  Consults: None  Significant Diagnostic Studies: CT  Treatments: surgery: Ileocolonic anastomosis for diversion Dr. Pat Patrick 11/1  Discharge Exam: Blood pressure (!) 151/89, pulse (!) 106, temperature 97.6 F (36.4 C), temperature source Oral, resp. rate (!) 24, height 5\' 9"  (1.753 m), weight 192 lb 9.6 oz (87.4 kg), SpO2 98 %. General appearance: alert, cooperative and no distress GI: Soft nondistended, appropriately tender, midline wound clean dry and intact with staples in place. Extremities: extremities normal, atraumatic, no cyanosis or edema  Disposition: 01-Home or Self Care  Discharge Instructions    Call MD for:  difficulty breathing, headache or visual disturbances    Complete by:  As directed    Call MD for:  persistant nausea and vomiting    Complete by:  As directed    Call MD for:  redness, tenderness, or signs of infection (pain, swelling, redness, odor or green/yellow discharge around incision site)     Complete by:  As directed    Call MD for:  severe uncontrolled pain    Complete by:  As directed    Call MD for:  temperature >100.4    Complete by:  As directed    Diet - low sodium heart healthy    Complete by:  As directed    Driving Restrictions    Complete by:  As directed    No driving while on prescription pain medication   Increase activity slowly    Complete by:  As directed    Lifting restrictions    Complete by:  As directed    No lifting over 15lbs for 4 weeks   May shower / Bathe    Complete by:  As directed    No dressing needed    Complete by:  As directed        Medication List    TAKE these medications   ALPRAZolam 0.5 MG tablet Commonly known as:  XANAX Take 1 tablet (0.5 mg total) by mouth 3 (three) times daily as needed for anxiety.   chlorproMAZINE 50 MG tablet Commonly known as:  THORAZINE Take 1 tablet (50 mg total) by mouth 3 (three) times daily as needed for hiccoughs.   ondansetron 8 MG tablet Commonly known as:  ZOFRAN Take 1 tablet (8 mg total) by mouth 2 (two) times daily as needed for refractory nausea / vomiting.   oxyCODONE-acetaminophen 5-325 MG tablet Commonly known as:  PERCOCET/ROXICET Take 1 tablet by mouth every 4 (four) hours as needed for severe pain. What changed:  Another medication with the  same name was added. Make sure you understand how and when to take each.   oxyCODONE-acetaminophen 5-325 MG tablet Commonly known as:  PERCOCET/ROXICET Take 1-2 tablets by mouth every 6 (six) hours as needed for moderate pain. What changed:  You were already taking a medication with the same name, and this prescription was added. Make sure you understand how and when to take each.      Follow-up [place] Follow up on 07/30/2016.   Why:  at 9:30am for treatment .       [place] Follow up on 07/21/2016.   Why:  9:00 in medical mall for PET scan Contact  information: [place] [phone]          Signed: Hubbard Robinson 07/26/2016, 4:37 PM

## 2016-07-28 ENCOUNTER — Ambulatory Visit: Payer: Commercial Managed Care - PPO

## 2016-07-29 NOTE — Progress Notes (Signed)
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   INTERVAL HISTORY: Patient returns to clinic today for hospital follow-up and treatment planning. Patient had progressive disease and was admitted with small bowel obstruction. He is now 2 weeks postop. He continues to be highly anxious. He does not complain of weakness or fatigue today. He has a good appetite.  He has no neurologic complaints. He denies any fevers or illnesses. He denies any chest pain or shortness of breath. He denies any nausea, vomiting, constipation, or diarrhea.  He has no urinary complaints. Patient offers no further specific complaints today.  REVIEW OF SYSTEMS:   Review of Systems  Constitutional: Negative for fever, malaise/fatigue and weight loss.  Respiratory: Negative.  Negative for cough and shortness of breath.   Cardiovascular: Negative.  Negative for chest pain.  Gastrointestinal: Negative for abdominal pain, blood in stool, constipation, diarrhea, melena, nausea and vomiting.  Genitourinary: Negative.   Musculoskeletal: Negative.   Neurological: Negative for weakness.  Psychiatric/Behavioral: Negative for depression. The patient is nervous/anxious. The patient does not have insomnia.     As per HPI. Otherwise, a complete review of systems is negative.  PAST MEDICAL HISTORY: Past Medical History:  Diagnosis Date  . Cancer (HCC)    colon cancer  . Headache    H/O  . Loosening of tooth    top front  . Shortness of breath dyspnea    on exertion    PAST SURGICAL HISTORY: Past Surgical History:    Procedure Laterality Date  . COLON RESECTION Right 07/16/2016   Procedure: COLON RESECTION;  Surgeon: Ralph Ely III, MD;  Location: [facility];  Service: General;  Laterality: Right;  . COLONOSCOPY WITH PROPOFOL N/A 03/06/2016   Procedure: COLONOSCOPY WITH PROPOFOL;  Surgeon: Darren Wohl, MD;  Location: [facility];  Service: Endoscopy;  Laterality: N/A;  . POLYPECTOMY  03/06/2016   Procedure: POLYPECTOMY;  Surgeon: Darren Wohl, MD;  Location: [facility];  Service: Endoscopy;;  . PORTACATH PLACEMENT Right 03/25/2016   Procedure: INSERTION PORT-A-CATH;  Surgeon: Ralph Ely III, MD;  Location: [facility];  Service: General;  Laterality: Right;  . Right foot surgery  2001    FAMILY HISTORY Family History  Problem Relation Age of Onset  . Pancreatic cancer Maternal Grandfather   . Lymphoma Maternal Grandmother   . CAD Father   . Heart disease Father        ADVANCED DIRECTIVES:    HEALTH MAINTENANCE: Social History  Substance Use Topics  . Smoking status: Current Every Day Smoker    Packs/day: 0.50    Years: 35.00    Types: Cigarettes  . Smokeless tobacco: Never Used     Comment: has more than 30 pack years history  . Alcohol use No     Colonoscopy:  PAP:  Bone density:  Lipid panel:  No Known Allergies  Current Outpatient Prescriptions  Medication Sig Dispense Refill  . ALPRAZolam (XANAX) 0.5 MG tablet Take 1 tablet (0.5 mg total) by mouth 3 (three) times daily as needed for anxiety. 45 tablet 0  . ondansetron (ZOFRAN) 8 MG tablet Take 1 tablet (8 mg total) by mouth 2 (  two) times daily as needed for refractory nausea / vomiting. 30 tablet 3  . oxyCODONE-acetaminophen (PERCOCET/ROXICET) 5-325 MG tablet Take 1 tablet by mouth every 4 (four) hours as needed for severe pain. 60 tablet 0  . regorafenib (STIVARGA) 40 MG tablet Take 4 tablets (160 mg total) by mouth daily with breakfast. Take with low fat meal. Caution: Chemotherapy. 21 tablet 5   No current  facility-administered medications for this visit.    Facility-Administered Medications Ordered in Other Visits  Medication Dose Route Frequency Provider Last Rate Last Dose  . 0.9 %  sodium chloride infusion   Intravenous Once Timothy J Finnegan, MD      . heparin lock flush 100 unit/mL  500 Units Intracatheter Once PRN Timothy J Finnegan, MD      . sodium chloride flush (NS) 0.9 % injection 10 mL  10 mL Intravenous PRN Timothy J Finnegan, MD   10 mL at 04/09/16 1014    OBJECTIVE: Vitals:   07/30/16 1028  BP: 130/88  Pulse: (!) 106  Resp: 18  Temp: (!) 96.8 F (36 C)     Body mass index is 29.56 kg/m.    ECOG FS:1 - Symptomatic but completely ambulatory  General: Well-developed, well-nourished, no acute distress. Eyes: Pink conjunctiva, anicteric sclera. Lungs: Clear to auscultation bilaterally. Heart: Regular rate and rhythm. No rubs, murmurs, or gallops. Abdomen: Mildly distended, nontender. Well-healing surgical scar with staples intact. Musculoskeletal: No edema, cyanosis, or clubbing. Neuro: Alert, answering all questions appropriately. Cranial nerves grossly intact. Skin: No rashes or petechiae noted. Psych: Normal affect.   LAB RESULTS:  Lab Results  Component Value Date   NA 136 07/30/2016   K 3.7 07/30/2016   CL 103 07/30/2016   CO2 26 07/30/2016   GLUCOSE 108 (H) 07/30/2016   BUN 10 07/30/2016   CREATININE 0.77 07/30/2016   CALCIUM 8.6 (L) 07/30/2016   PROT 7.3 07/30/2016   ALBUMIN 2.8 (L) 07/30/2016   AST 13 (L) 07/30/2016   ALT 11 (L) 07/30/2016   ALKPHOS 86 07/30/2016   BILITOT 0.5 07/30/2016   GFRNONAA >60 07/30/2016   GFRAA >60 07/30/2016    Lab Results  Component Value Date   WBC 8.0 07/30/2016   NEUTROABS 5.0 07/30/2016   HGB 12.1 (L) 07/30/2016   HCT 35.7 (L) 07/30/2016   MCV 83.0 07/30/2016   PLT 375 07/30/2016   Lab Results  Component Value Date   CEA 3.1 02/18/2016     STUDIES: Dg Abd 1 View  Result Date: 07/13/2016 CLINICAL  DATA:  Enteric tube placement EXAM: ABDOMEN - 1 VIEW COMPARISON:  CT abdomen/ pelvis from earlier today FINDINGS: Enteric tube terminates in the very proximal stomach with the side port in the lower thoracic esophagus. Dilated small bowel loops are again noted throughout the abdomen. Evidence of pneumatosis or pneumoperitoneum. Clear lung bases. IMPRESSION: Enteric tube terminates in the very proximal stomach with the side port in the lower thoracic esophagus, consider advancing 4-5 cm. Re- demonstration of findings of small bowel/proximal large bowel obstruction. Electronically Signed   By: Jason A Poff M.D.   On: 07/13/2016 11:02   Ct Abdomen Pelvis W Contrast  Result Date: 07/13/2016 CLINICAL DATA:  Abdomen and pelvis pain and distension for the past 3 days. Nausea and vomiting during that time. Stage IV colon cancer. EXAM: CT ABDOMEN AND PELVIS WITH CONTRAST TECHNIQUE: Multidetector CT imaging of the abdomen and pelvis was performed using the standard protocol following bolus administration of intravenous contrast. CONTRAST:    100mL ISOVUE-300 IOPAMIDOL (ISOVUE-300) INJECTION 61% COMPARISON:  Chest and abdomen radiographs dated 04/23/2016. PET-CT dated 03/04/2016 and abdomen and pelvis CT dated 02/18/2016. FINDINGS: Lower chest: Mild bibasilar linear atelectasis. Hepatobiliary: Interval peritoneal masses and associated fluid indenting into the dome of the liver. No intrahepatic masses are seen. Unremarkable gallbladder. Pancreas: Unremarkable. No pancreatic ductal dilatation or surrounding inflammatory changes. Spleen: Normal in size without focal abnormality. Adrenals/Urinary Tract: Adrenal glands are unremarkable. Kidneys are normal, without renal calculi, focal lesion, or hydronephrosis. Bladder is unremarkable. Stomach/Bowel: Interval multiple dilated proximal small bowel loops with normal caliber distal loops. The right colon is also dilated. There is an hepatic flexure colon mass measuring 10.4 cm in  length and 2.2 cm in maximum thickness. The stomach is dilated. The mid and distal colon are normal in caliber. There is concentric wall thickening involving the distal rectum with a maximum thickness of 1.2 cm, not present on 02/18/2016 where there was better distention of the rectum. No evidence of appendicitis. Vascular/Lymphatic: Atheromatous arterial calcifications, including mild calcifications in the abdominal aorta. Duplicated inferior vena cava multiple mesenteric masses with progression. Some of these could represent mesenteric nodes. Reproductive: Normal sized prostate gland. Other: Small amount of free peritoneal fluid with improvement. Small bilateral inguinal hernias containing fat. Musculoskeletal: Lumbar and thoracic spine degenerative changes. No evidence of bony metastatic disease. IMPRESSION: 1. Partial small bowel obstruction. This could be due to an obstructing peritoneal metastasis or adhesion. 2. Significant increase in size of a colon mass at the hepatic flexure, causing partial obstruction of the right colon. This is compatible with the patient's known primary colon carcinoma. 3. Progressive peritoneal metastatic disease. 4. Decreased malignant ascites. 5. Concentric wall thickening involving the distal rectum. This could be due to incomplete distention of the rectum or interval proctitis. A rectal mass is also a possibility. 6. Aortic atherosclerosis. Electronically Signed   By: Steven  Reid M.D.   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  PLAN:    1. 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 Further testing of patient's pathology revealed intact and MMR proteins indicating low probability of MSI-H. Because of this, immunotherapy would unlikely  be helpful. After lengthy discussion with the patient, he wishes to proceed with second line treatment. He was given the option of using FOLFIRI or oral Regorafenib. Patient wishes to proceed with oral chemotherapy. Will wait at least until he is 4 weeks postop to initiate treatment. Plan to get a CT scan in 2 weeks to assess a new baseline. Patient will return to clinic 1-2 days later for further evaluation, discussion of the results, and initiation of oral treatment. 2. Pain: Continue oxycodone as needed. 3. Diarrhea: Continue Lomotil as needed.  4. Difficulty sleeping: Discontinue dexamethasone as above. Ambien has been discontinued and patient was given a prescription for Xanax. 5. Anxiety: Xanax as needed 3 times a day. Patient has refused psychiatry referral for further management of his depression and anxiety. 6. Postop surgical care: Patient still has his staples in his abdomen, but has appointment with surgery later today.   Patient expressed understanding and was in agreement with this plan. He also understands that He can call clinic at any time with any questions, concerns, or complaints.   Timothy J Finnegan, MD   07/30/2016 3:14 PM      

## 2016-07-30 ENCOUNTER — Inpatient Hospital Stay (HOSPITAL_BASED_OUTPATIENT_CLINIC_OR_DEPARTMENT_OTHER): Payer: Commercial Managed Care - PPO | Admitting: Oncology

## 2016-07-30 ENCOUNTER — Ambulatory Visit (INDEPENDENT_AMBULATORY_CARE_PROVIDER_SITE_OTHER): Payer: Commercial Managed Care - PPO | Admitting: Surgery

## 2016-07-30 ENCOUNTER — Inpatient Hospital Stay: Payer: Commercial Managed Care - PPO | Attending: Oncology

## 2016-07-30 ENCOUNTER — Encounter: Payer: Self-pay | Admitting: Surgery

## 2016-07-30 VITALS — BP 143/90 | HR 112 | Temp 97.9°F | Ht 69.0 in | Wt 198.0 lb

## 2016-07-30 VITALS — BP 130/88 | HR 106 | Temp 96.8°F | Resp 18 | Wt 200.2 lb

## 2016-07-30 DIAGNOSIS — C772 Secondary and unspecified malignant neoplasm of intra-abdominal lymph nodes: Secondary | ICD-10-CM

## 2016-07-30 DIAGNOSIS — R0602 Shortness of breath: Secondary | ICD-10-CM

## 2016-07-30 DIAGNOSIS — Z800 Family history of malignant neoplasm of digestive organs: Secondary | ICD-10-CM | POA: Insufficient documentation

## 2016-07-30 DIAGNOSIS — G4700 Insomnia, unspecified: Secondary | ICD-10-CM

## 2016-07-30 DIAGNOSIS — R180 Malignant ascites: Secondary | ICD-10-CM | POA: Insufficient documentation

## 2016-07-30 DIAGNOSIS — Z79899 Other long term (current) drug therapy: Secondary | ICD-10-CM | POA: Diagnosis not present

## 2016-07-30 DIAGNOSIS — C801 Malignant (primary) neoplasm, unspecified: Secondary | ICD-10-CM

## 2016-07-30 DIAGNOSIS — R197 Diarrhea, unspecified: Secondary | ICD-10-CM

## 2016-07-30 DIAGNOSIS — C786 Secondary malignant neoplasm of retroperitoneum and peritoneum: Secondary | ICD-10-CM

## 2016-07-30 DIAGNOSIS — C182 Malignant neoplasm of ascending colon: Secondary | ICD-10-CM | POA: Diagnosis present

## 2016-07-30 DIAGNOSIS — Z01812 Encounter for preprocedural laboratory examination: Secondary | ICD-10-CM

## 2016-07-30 DIAGNOSIS — I700 Atherosclerosis of aorta: Secondary | ICD-10-CM | POA: Insufficient documentation

## 2016-07-30 DIAGNOSIS — Z9049 Acquired absence of other specified parts of digestive tract: Secondary | ICD-10-CM

## 2016-07-30 DIAGNOSIS — C189 Malignant neoplasm of colon, unspecified: Secondary | ICD-10-CM

## 2016-07-30 DIAGNOSIS — F419 Anxiety disorder, unspecified: Secondary | ICD-10-CM | POA: Diagnosis not present

## 2016-07-30 DIAGNOSIS — Z9889 Other specified postprocedural states: Secondary | ICD-10-CM | POA: Insufficient documentation

## 2016-07-30 DIAGNOSIS — F17210 Nicotine dependence, cigarettes, uncomplicated: Secondary | ICD-10-CM

## 2016-07-30 LAB — PROTEIN, URINE, RANDOM: Total Protein, Urine: 44 mg/dL

## 2016-07-30 LAB — COMPREHENSIVE METABOLIC PANEL
ALT: 11 U/L — AB (ref 17–63)
AST: 13 U/L — AB (ref 15–41)
Albumin: 2.8 g/dL — ABNORMAL LOW (ref 3.5–5.0)
Alkaline Phosphatase: 86 U/L (ref 38–126)
Anion gap: 7 (ref 5–15)
BUN: 10 mg/dL (ref 6–20)
CALCIUM: 8.6 mg/dL — AB (ref 8.9–10.3)
CHLORIDE: 103 mmol/L (ref 101–111)
CO2: 26 mmol/L (ref 22–32)
Creatinine, Ser: 0.77 mg/dL (ref 0.61–1.24)
Glucose, Bld: 108 mg/dL — ABNORMAL HIGH (ref 65–99)
POTASSIUM: 3.7 mmol/L (ref 3.5–5.1)
SODIUM: 136 mmol/L (ref 135–145)
TOTAL PROTEIN: 7.3 g/dL (ref 6.5–8.1)
Total Bilirubin: 0.5 mg/dL (ref 0.3–1.2)

## 2016-07-30 LAB — CBC WITH DIFFERENTIAL/PLATELET
BASOS ABS: 0 10*3/uL (ref 0–0.1)
Basophils Relative: 1 %
EOS ABS: 0.5 10*3/uL (ref 0–0.7)
EOS PCT: 6 %
HCT: 35.7 % — ABNORMAL LOW (ref 40.0–52.0)
Hemoglobin: 12.1 g/dL — ABNORMAL LOW (ref 13.0–18.0)
LYMPHS PCT: 23 %
Lymphs Abs: 1.8 10*3/uL (ref 1.0–3.6)
MCH: 28.1 pg (ref 26.0–34.0)
MCHC: 33.9 g/dL (ref 32.0–36.0)
MCV: 83 fL (ref 80.0–100.0)
MONO ABS: 0.6 10*3/uL (ref 0.2–1.0)
Monocytes Relative: 8 %
Neutro Abs: 5 10*3/uL (ref 1.4–6.5)
Neutrophils Relative %: 62 %
PLATELETS: 375 10*3/uL (ref 150–440)
RBC: 4.3 MIL/uL — AB (ref 4.40–5.90)
RDW: 17.9 % — AB (ref 11.5–14.5)
WBC: 8 10*3/uL (ref 3.8–10.6)

## 2016-07-30 MED ORDER — OXYCODONE-ACETAMINOPHEN 5-325 MG PO TABS: 1.0000 | ORAL_TABLET | Freq: Four times a day (QID) | ORAL | 0 refills | Status: DC | PRN

## 2016-07-30 MED ORDER — REGORAFENIB 40 MG PO TABS: 160.0000 mg | ORAL_TABLET | Freq: Every day | ORAL | 5 refills | Status: DC

## 2016-07-30 MED ORDER — ALPRAZOLAM 0.5 MG PO TABS: 0.5000 mg | ORAL_TABLET | Freq: Three times a day (TID) | ORAL | 0 refills | Status: DC | PRN

## 2016-07-30 NOTE — Progress Notes (Signed)
Outpatient Surgical Follow Up  07/30/2016  Andrew Ibarra is an 50 y.o. male seen for the diagnosis of Colon adenocarcinoma ([place]) [C18.9].  HPI: Patient seen and examined in clinic. He had a ileocolonic bypass procedure done on 11/1 with Dr. Pat Patrick. Overall he is doing well with no acute complaints. he denies N/V, D/C, fevers or malaise. He is having good BMs now after having some diarrhea on Saturday.    Past Medical History:  Diagnosis Date  . Cancer [facility])    colon cancer  . Headache    H/O  . Loosening of tooth    top front  . Shortness of breath dyspnea    on exertion    Past Surgical History:  Procedure Laterality Date  . COLON RESECTION Right 07/16/2016   Procedure: COLON RESECTION;  Surgeon: [name], MD;  Location: [facility];  Service: General;  Laterality: Right;  . COLONOSCOPY WITH PROPOFOL N/A 03/06/2016   Procedure: COLONOSCOPY WITH PROPOFOL;  Surgeon: Lucilla Lame, MD;  Location: [place];  Service: Endoscopy;  Laterality: N/A;  . POLYPECTOMY  03/06/2016   Procedure: POLYPECTOMY;  Surgeon: Lucilla Lame, MD;  Location: [place];  Service: Endoscopy;;  . PORTACATH PLACEMENT Right 03/25/2016   Procedure: INSERTION PORT-A-CATH;  Surgeon: [name], MD;  Location: [facility];  Service: General;  Laterality: Right;  . Right foot surgery  2001    Family History  Problem Relation Age of Onset  . Pancreatic cancer Maternal Grandfather   . Lymphoma Maternal Grandmother   . CAD Father   . Heart disease Father     Social History:  reports that he has been smoking Cigarettes.  He has a 17.50 pack-year smoking history. He has never used smokeless tobacco. He reports that he does not drink alcohol or use drugs.  Allergies: No Known Allergies  Medications reviewed.  Physical Exam:  BP (!) 143/90   Pulse (!) 112   Temp 97.9 F (36.6 C) (Oral)   Ht 5' 9"  (1.753 m)   Wt 198 lb (89.8 kg)   BMI 29.24 kg/m   Gen: patient resting comfortably in  clinic, no cardiovascular or respiratory distress Abd/GI: incision clean and dry, some erythema just around staples, no drainage  Results for orders placed or performed in visit on 07/30/16 (from the past 48 hour(s))  CBC with Differential     Status: Abnormal   Collection Time: 07/30/16  8:55 AM  Result Value Ref Range   WBC 8.0 3.8 - 10.6 K/uL   RBC 4.30 (L) 4.40 - 5.90 MIL/uL   Hemoglobin 12.1 (L) 13.0 - 18.0 g/dL   HCT 35.7 (L) 40.0 - 52.0 %   MCV 83.0 80.0 - 100.0 fL   MCH 28.1 26.0 - 34.0 pg   MCHC 33.9 32.0 - 36.0 g/dL   RDW 17.9 (H) 11.5 - 14.5 %   Platelets 375 150 - 440 K/uL   Neutrophils Relative % 62 %   Neutro Abs 5.0 1.4 - 6.5 K/uL   Lymphocytes Relative 23 %   Lymphs Abs 1.8 1.0 - 3.6 K/uL   Monocytes Relative 8 %   Monocytes Absolute 0.6 0.2 - 1.0 K/uL   Eosinophils Relative 6 %   Eosinophils Absolute 0.5 0 - 0.7 K/uL   Basophils Relative 1 %   Basophils Absolute 0.0 0 - 0.1 K/uL  Comprehensive metabolic panel     Status: Abnormal   Collection Time: 07/30/16  8:55 AM  Result Value Ref Range  Sodium 136 135 - 145 mmol/L   Potassium 3.7 3.5 - 5.1 mmol/L   Chloride 103 101 - 111 mmol/L   CO2 26 22 - 32 mmol/L   Glucose, Bld 108 (H) 65 - 99 mg/dL   BUN 10 6 - 20 mg/dL   Creatinine, Ser 0.77 0.61 - 1.24 mg/dL   Calcium 8.6 (L) 8.9 - 10.3 mg/dL   Total Protein 7.3 6.5 - 8.1 g/dL   Albumin 2.8 (L) 3.5 - 5.0 g/dL   AST 13 (L) 15 - 41 U/L   ALT 11 (L) 17 - 63 U/L   Alkaline Phosphatase 86 38 - 126 U/L   Total Bilirubin 0.5 0.3 - 1.2 mg/dL   GFR calc non Af Amer >60 >60 mL/min   GFR calc Af Amer >60 >60 mL/min    Comment: (NOTE) The eGFR has been calculated using the CKD EPI equation. This calculation has not been validated in all clinical situations. eGFR's persistently <60 mL/min signify possible Chronic Kidney Disease.    Anion gap 7 5 - 15  Protein, urine, random     Status: None   Collection Time: 07/30/16  9:00 AM  Result Value Ref Range   Total  Protein, Urine 44 mg/dL    Comment: NO NORMAL RANGE ESTABLISHED FOR THIS TEST   No results found.  Assessment/Plan: Andrew Ibarra is an 50 y.o. male seen for the diagnosis of Colon adenocarcinoma ([place]) [C18.9]. Progressing as expected. His staples are removed and Steri-Strips were placed. He was instructed to continue showering and that these would fall off in 2-3 weeks. He was instructed to continue drinking plenty of water. He also is to call if he has any constipation. Patient is to call if he has any fever chills or any questions or concerns. Otherwise he will be following up with oncology and they can send him back to [place] if surgery is needed.  Alexis Mizuno L. Jiana Lemaire MD General Surgeon  07/30/2016,2:26 PM

## 2016-07-30 NOTE — Progress Notes (Signed)
Patient had surgery for ileocolonic anastomosis to bypass this cancer on 07/16/16 by Dr. Pat Patrick.  Patient has abdominal pain in the mornings that is relieved after eating.

## 2016-07-31 ENCOUNTER — Other Ambulatory Visit: Payer: Self-pay | Admitting: *Deleted

## 2016-07-31 DIAGNOSIS — C772 Secondary and unspecified malignant neoplasm of intra-abdominal lymph nodes: Principal | ICD-10-CM

## 2016-07-31 DIAGNOSIS — C182 Malignant neoplasm of ascending colon: Secondary | ICD-10-CM

## 2016-07-31 MED ORDER — REGORAFENIB 40 MG PO TABS: 160.0000 mg | ORAL_TABLET | Freq: Every day | ORAL | 5 refills | Status: DC

## 2016-08-05 ENCOUNTER — Telehealth: Payer: Self-pay | Admitting: *Deleted

## 2016-08-05 NOTE — Telephone Encounter (Signed)
Raquel at [employer] has initiated prior auth on stivarga. Required additional information. Information was given and it was mentioned that the [place] may be denied by insurance since the pt has not tried and failed therapy with FOLFIRI. Prior Josem Kaufmann has been submitted. Awaiting response from insurance.

## 2016-08-18 ENCOUNTER — Ambulatory Visit
Admission: RE | Admit: 2016-08-18 | Discharge: 2016-08-18 | Disposition: A | Discharge status: Home / Self Care | Payer: Commercial Managed Care - PPO | Source: Ambulatory Visit | Attending: Oncology | Admitting: Oncology

## 2016-08-18 ENCOUNTER — Telehealth: Payer: Self-pay | Admitting: *Deleted

## 2016-08-18 ENCOUNTER — Inpatient Hospital Stay: Payer: Commercial Managed Care - PPO | Attending: Oncology

## 2016-08-18 DIAGNOSIS — C786 Secondary malignant neoplasm of retroperitoneum and peritoneum: Secondary | ICD-10-CM | POA: Diagnosis not present

## 2016-08-18 DIAGNOSIS — F17210 Nicotine dependence, cigarettes, uncomplicated: Secondary | ICD-10-CM | POA: Insufficient documentation

## 2016-08-18 DIAGNOSIS — C772 Secondary and unspecified malignant neoplasm of intra-abdominal lymph nodes: Secondary | ICD-10-CM | POA: Insufficient documentation

## 2016-08-18 DIAGNOSIS — R188 Other ascites: Secondary | ICD-10-CM | POA: Diagnosis not present

## 2016-08-18 DIAGNOSIS — C801 Malignant (primary) neoplasm, unspecified: Secondary | ICD-10-CM

## 2016-08-18 DIAGNOSIS — Z79899 Other long term (current) drug therapy: Secondary | ICD-10-CM | POA: Insufficient documentation

## 2016-08-18 DIAGNOSIS — K6389 Other specified diseases of intestine: Secondary | ICD-10-CM | POA: Diagnosis not present

## 2016-08-18 DIAGNOSIS — F419 Anxiety disorder, unspecified: Secondary | ICD-10-CM | POA: Insufficient documentation

## 2016-08-18 DIAGNOSIS — Z9049 Acquired absence of other specified parts of digestive tract: Secondary | ICD-10-CM | POA: Insufficient documentation

## 2016-08-18 DIAGNOSIS — C182 Malignant neoplasm of ascending colon: Secondary | ICD-10-CM | POA: Diagnosis present

## 2016-08-18 DIAGNOSIS — Z800 Family history of malignant neoplasm of digestive organs: Secondary | ICD-10-CM | POA: Insufficient documentation

## 2016-08-18 DIAGNOSIS — Z807 Family history of other malignant neoplasms of lymphoid, hematopoietic and related tissues: Secondary | ICD-10-CM | POA: Insufficient documentation

## 2016-08-18 DIAGNOSIS — R0602 Shortness of breath: Secondary | ICD-10-CM | POA: Insufficient documentation

## 2016-08-18 DIAGNOSIS — R109 Unspecified abdominal pain: Secondary | ICD-10-CM | POA: Insufficient documentation

## 2016-08-18 MED ORDER — IOPAMIDOL (ISOVUE-300) INJECTION 61%
100.0000 mL | Freq: Once | INTRAVENOUS | Status: AC | PRN
  Administered 2016-08-18: 100 mL via INTRAVENOUS

## 2016-08-18 NOTE — Telephone Encounter (Signed)
[employer] has been trying to reach out to patient to set up delivery of stivarga. Pt does not answer and does not return phone call. [employer] needs alternate phone number. Pt's emergency contact his mother, was given to [employer] to contact.

## 2016-08-19 NOTE — Progress Notes (Signed)
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   INTERVAL HISTORY: Patient returns to clinic today for further evaluation and initiation ofRegorafenib. He continues to be highly anxious. He is fully healed from his surgery, but continues to have occasional stomach cramps. He does not complain of weakness or fatigue today. He has a good appetite.  He has no neurologic complaints. He denies any fevers or illnesses. He denies any chest pain or shortness of breath. He denies any nausea, vomiting, constipation, or diarrhea.  He has no urinary complaints. Patient offers no further specific complaints today.  REVIEW OF SYSTEMS:   Review of Systems  Constitutional: Negative for fever, malaise/fatigue and weight loss.  Respiratory: Negative.  Negative for cough and shortness of breath.   Cardiovascular: Negative.  Negative for chest pain.  Gastrointestinal: Positive for abdominal pain. Negative for blood in stool, constipation, diarrhea, melena, nausea and vomiting.  Genitourinary: Negative.   Musculoskeletal: Negative.   Neurological: Negative for weakness.  Psychiatric/Behavioral: Negative for depression. The patient is nervous/anxious. The patient does not have insomnia.     As per HPI. Otherwise, a complete review of systems is negative.  PAST MEDICAL HISTORY: Past Medical History:  Diagnosis Date  . Cancer [facility])    colon cancer  . Headache    H/O  . Loosening of tooth    top front  . Shortness of breath dyspnea    on exertion    PAST SURGICAL HISTORY: Past Surgical History:    Procedure Laterality Date  . COLON RESECTION Right 07/16/2016   Procedure: COLON RESECTION;  Surgeon: [name], MD;  Location: [facility];  Service: General;  Laterality: Right;  . COLONOSCOPY WITH PROPOFOL N/A 03/06/2016   Procedure: COLONOSCOPY WITH PROPOFOL;  Surgeon: Lucilla Lame, MD;  Location: [place];  Service: Endoscopy;  Laterality: N/A;  . POLYPECTOMY  03/06/2016   Procedure: POLYPECTOMY;  Surgeon: Lucilla Lame, MD;  Location: [place];  Service: Endoscopy;;  . PORTACATH PLACEMENT Right 03/25/2016   Procedure: INSERTION PORT-A-CATH;  Surgeon: [name], MD;  Location: [facility];  Service: General;  Laterality: Right;  . Right foot surgery  2001    FAMILY HISTORY Family History  Problem Relation Age of Onset  . Pancreatic cancer Maternal Grandfather   . Lymphoma Maternal Grandmother   . CAD Father   . Heart disease Father        ADVANCED DIRECTIVES:    HEALTH MAINTENANCE: Social History  Substance Use Topics  . Smoking status: Current Every Day Smoker    Packs/day: 0.50    Years: 35.00    Types: Cigarettes  . Smokeless tobacco: Never Used     Comment: has more than 30 pack years history  . Alcohol use No     Colonoscopy:  PAP:  Bone density:  Lipid panel:  No Known Allergies  Current Outpatient Prescriptions  Medication Sig Dispense Refill  . ALPRAZolam (XANAX) 0.5 MG tablet Take 1 tablet (0.5 mg total) by mouth 3 (three) times daily as needed for anxiety. 45 tablet 0  . ondansetron (ZOFRAN) 8 MG tablet Take 1 tablet (8 mg total) by mouth 2 (two)  times daily as needed for refractory nausea / vomiting. 30 tablet 3  . oxyCODONE-acetaminophen (PERCOCET/ROXICET) 5-325 MG tablet Take 1 tablet by mouth every 4 (four) hours as needed for severe pain. 60 tablet 0  . regorafenib (STIVARGA) 40 MG tablet Take 4 tablets (160 mg total) by mouth daily with breakfast. Take for 21 days then 7 days off. Take with low fat meal. Caution: Chemotherapy.  (Patient not taking: Reported on 08/20/2016) 84 tablet 5   No current facility-administered medications for this visit.    Facility-Administered Medications Ordered in Other Visits  Medication Dose Route Frequency Provider Last Rate Last Dose  . 0.9 %  sodium chloride infusion   Intravenous Once Lloyd Huger, MD      . heparin lock flush 100 unit/mL  500 Units Intracatheter Once PRN Lloyd Huger, MD      . sodium chloride flush (NS) 0.9 % injection 10 mL  10 mL Intravenous PRN Lloyd Huger, MD   10 mL at 04/09/16 1014    OBJECTIVE: Vitals:   08/20/16 1158  BP: 129/89  Pulse: (!) 106  Resp: 18  Temp: (!) 96.2 F (35.7 C)     Body mass index is 28.86 kg/m.    ECOG FS:1 - Symptomatic but completely ambulatory  General: Well-developed, well-nourished, no acute distress. Eyes: Pink conjunctiva, anicteric sclera. Lungs: Clear to auscultation bilaterally. Heart: Regular rate and rhythm. No rubs, murmurs, or gallops. Abdomen: Mildly distended, nontender. Well-healing surgical scar. Musculoskeletal: No edema, cyanosis, or clubbing. Neuro: Alert, answering all questions appropriately. Cranial nerves grossly intact. Skin: No rashes or petechiae noted. Psych: Normal affect.   LAB RESULTS:  Lab Results  Component Value Date   NA 136 07/30/2016   K 3.7 07/30/2016   CL 103 07/30/2016   CO2 26 07/30/2016   GLUCOSE 108 (H) 07/30/2016   BUN 10 07/30/2016   CREATININE 0.77 07/30/2016   CALCIUM 8.6 (L) 07/30/2016   PROT 7.3 07/30/2016   ALBUMIN 2.8 (L) 07/30/2016   AST 13 (L) 07/30/2016   ALT 11 (L) 07/30/2016   ALKPHOS 86 07/30/2016   BILITOT 0.5 07/30/2016   GFRNONAA >60 07/30/2016   GFRAA >60 07/30/2016    Lab Results  Component Value Date   WBC 8.0 07/30/2016   NEUTROABS 5.0 07/30/2016   HGB 12.1 (L) 07/30/2016   HCT 35.7 (L) 07/30/2016   MCV 83.0 07/30/2016   PLT 375 07/30/2016   Lab Results  Component Value Date   CEA 3.1 02/18/2016      STUDIES: Ct Abdomen Pelvis W Contrast  Result Date: 08/18/2016 CLINICAL DATA:  Cancer ascending colon with peritoneal carcinomatosis. EXAM: CT ABDOMEN AND PELVIS WITH CONTRAST TECHNIQUE: Multidetector CT imaging of the abdomen and pelvis was performed using the standard protocol following bolus administration of intravenous contrast. CONTRAST:  136m ISOVUE-300 IOPAMIDOL (ISOVUE-300) INJECTION 61% COMPARISON:  CT chest 06/10/2016.  Abdomen and pelvis CT 07/13/2016. FINDINGS: Lower chest:  Tiny left pleural effusion. Hepatobiliary: 2.8 cm lesion along the capsule at the hepatic dome is not substantially changed. Otherwise no focal abnormality within the liver parenchyma. There is no evidence for gallstones, gallbladder wall thickening, or pericholecystic fluid. No intrahepatic or extrahepatic biliary dilation. Pancreas: No focal mass lesion. No dilatation of the main duct. No intraparenchymal cyst. No peripancreatic edema. Spleen: No splenomegaly. No focal mass lesion. Adrenals/Urinary Tract: No adrenal nodule or mass. Kidneys are unremarkable. No evidence for hydroureter. Bladder is decompressed. Stomach/Bowel: Stomach is nondistended. No gastric wall thickening. No  evidence of outlet obstruction. Duodenum is normally positioned as is the ligament of Treitz. Mild diffuse distention of small bowel evident, but decreased since prior. Terminal ileum unremarkable. Mild cecal distention is stable. Anastomotic suture line noted sigmoid colon and there is some mild wall thickening just proximal to the anastomosis (see image 62 series 2 and image 37 series 5). No evidence for overt obstruction proximal to the anastomosis at this time. Vascular/Lymphatic: No abdominal aortic aneurysm. No gastrohepatic or hepatoduodenal ligament lymphadenopathy. Small retroperitoneal lymph nodes are not substantially progressed in the interval. Persistent left-sided IVC noted incidentally. The omental disease and mesenteric  nodularity persists without substantial change. Reproducible measurement is difficult given mobility of the mesentery and omentum, but a right mesenteric nodule measuring 3.4 x 1.9 cm today was 3.3 x 2.1 cm when I remeasure on the prior study. 2.9 x 1.5 cm posterior left mesenteric nodule was 2.6 x 1.5 cm previously. Areas of peritoneal thickening bilaterally also show no substantial interval change. Reproductive: The prostate gland and seminal vesicles have normal imaging features. Other: Small volume intraperitoneal free fluid evident. Musculoskeletal: Bone windows reveal no worrisome lytic or sclerotic osseous lesions. IMPRESSION: 1. Interval bowel surgery with anastomosis identified in the central pelvis. There is mild wall thickening just proximal to the anastomosis, but no evidence for bowel obstruction at this time. 2. The omental, mesenteric, and peritoneal disease seen on the previous study has not changed substantially in the interval. 3. Stable small volume ascites. Electronically Signed   By: Misty Stanley M.D.   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  PLAN:    1. 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 Further testing of patient's pathology revealed intact and MMR proteins indicating low probability of MSI-H. Because of this, immunotherapy would unlikely be helpful. After lengthy discussion with the patient, he wishes to proceed with second line treatment. He was given the option of using FOLFIRI or oral Regorafenib. Patient wishes to proceed with oral chemotherapy which he will start today. Restaging CT reviewed independently and reported as above. Patient will return to clinic in 4 weeks for repeat laboratory work and further evaluation.   2. Pain:  Continue oxycodone as needed. 3. Diarrhea: Continue Lomotil as needed.  4. Difficulty sleeping: Ambien has been discontinued and patient was given a prescription for Xanax. 5. Anxiety: Xanax as needed 3 times a day. Patient has refused psychiatry referral for further management of his depression and anxiety. 6. Postop surgical care: Continue follow up with surgery as indicated.   Patient expressed understanding and was in agreement with this plan. He also understands that He can call clinic at any time with any questions, concerns, or complaints.   Lloyd Huger, MD   08/22/2016 11:17 PM

## 2016-08-20 ENCOUNTER — Inpatient Hospital Stay (HOSPITAL_BASED_OUTPATIENT_CLINIC_OR_DEPARTMENT_OTHER): Payer: Commercial Managed Care - PPO | Admitting: Oncology

## 2016-08-20 VITALS — BP 129/89 | HR 106 | Temp 96.2°F | Resp 18 | Wt 195.4 lb

## 2016-08-20 DIAGNOSIS — C182 Malignant neoplasm of ascending colon: Secondary | ICD-10-CM | POA: Diagnosis not present

## 2016-08-20 DIAGNOSIS — R0602 Shortness of breath: Secondary | ICD-10-CM

## 2016-08-20 DIAGNOSIS — R109 Unspecified abdominal pain: Secondary | ICD-10-CM

## 2016-08-20 DIAGNOSIS — C801 Malignant (primary) neoplasm, unspecified: Secondary | ICD-10-CM

## 2016-08-20 DIAGNOSIS — Z800 Family history of malignant neoplasm of digestive organs: Secondary | ICD-10-CM | POA: Diagnosis not present

## 2016-08-20 DIAGNOSIS — F419 Anxiety disorder, unspecified: Secondary | ICD-10-CM | POA: Diagnosis not present

## 2016-08-20 DIAGNOSIS — C786 Secondary malignant neoplasm of retroperitoneum and peritoneum: Secondary | ICD-10-CM

## 2016-08-20 DIAGNOSIS — Z79899 Other long term (current) drug therapy: Secondary | ICD-10-CM | POA: Diagnosis not present

## 2016-08-20 DIAGNOSIS — Z9049 Acquired absence of other specified parts of digestive tract: Secondary | ICD-10-CM | POA: Diagnosis not present

## 2016-08-20 DIAGNOSIS — F17210 Nicotine dependence, cigarettes, uncomplicated: Secondary | ICD-10-CM | POA: Diagnosis not present

## 2016-08-20 DIAGNOSIS — C772 Secondary and unspecified malignant neoplasm of intra-abdominal lymph nodes: Principal | ICD-10-CM

## 2016-08-20 DIAGNOSIS — Z807 Family history of other malignant neoplasms of lymphoid, hematopoietic and related tissues: Secondary | ICD-10-CM

## 2016-08-20 MED ORDER — ALPRAZOLAM 0.5 MG PO TABS
0.5000 mg | ORAL_TABLET | Freq: Three times a day (TID) | ORAL | 0 refills | Status: DC | PRN
Start: 2016-08-20 — End: 2016-10-27

## 2016-08-20 MED ORDER — OXYCODONE-ACETAMINOPHEN 5-325 MG PO TABS: 1.0000 | ORAL_TABLET | ORAL | 0 refills | Status: DC | PRN

## 2016-08-20 NOTE — Progress Notes (Signed)
Has occasional stomach cramps. Pt should get stivarga in the mail today. Pt was educated regarding side effects of medication, how to take, and when to call our office. Pt verbalized understanding. Informed consent has been obtained.

## 2016-08-26 ENCOUNTER — Telehealth: Payer: Self-pay | Admitting: *Deleted

## 2016-08-26 NOTE — Telephone Encounter (Signed)
Patient advised per VO Dr Grayland Ormond that it is alright to skip a dose of his Stivarga . He stated he will skip today and restart it tomorrow

## 2016-08-26 NOTE — Telephone Encounter (Signed)
Called to report that the "pills" are making him feel weak and  Wants to know if he can skip a dose. Please advise

## 2016-09-05 ENCOUNTER — Telehealth: Payer: Self-pay | Admitting: *Deleted

## 2016-09-05 NOTE — Telephone Encounter (Signed)
Unable to reach patient regarding his in coming call about his symptoms from chemo.

## 2016-09-10 ENCOUNTER — Telehealth: Payer: Self-pay | Admitting: *Deleted

## 2016-09-10 NOTE — Telephone Encounter (Signed)
Patient notified that he can take 14 days off, f/u to be moved out by 1 week. Patient will restart Stivarga once he follows up on 1/15.

## 2016-09-10 NOTE — Telephone Encounter (Signed)
Patient called in this morning to report weakness and shortness of breath on Stivarga. Patient also reports constipation but states has improved with OTC medications. Patient is currently on cycle of 21 days on, 7 days off. Patient will take his last dose of current cycle on Saturday 12/30. Patient is asking if his cycle can be adjusted so that he has more time off Stivarga. Please advise.

## 2016-09-10 NOTE — Telephone Encounter (Signed)
Yes he can take 14 days instead of 7. Please adjust f/u accordingly.

## 2016-09-16 ENCOUNTER — Telehealth: Payer: Self-pay | Admitting: *Deleted

## 2016-09-16 NOTE — Telephone Encounter (Signed)
Called to report that he has blisters on his heel and on his fingers where he had slits in his fingertips. Per Dr Grayland Ormond, continue to hold Stivarga and notify us if it gets worse. Patient advised of this and repeated back to me. Appt for 1/15 confirmed

## 2016-09-17 ENCOUNTER — Telehealth: Payer: Self-pay | Admitting: *Deleted

## 2016-09-17 DIAGNOSIS — C182 Malignant neoplasm of ascending colon: Secondary | ICD-10-CM

## 2016-09-17 DIAGNOSIS — C772 Secondary and unspecified malignant neoplasm of intra-abdominal lymph nodes: Principal | ICD-10-CM

## 2016-09-17 MED ORDER — REGORAFENIB 40 MG PO TABS: 160.0000 mg | ORAL_TABLET | Freq: Every day | ORAL | 5 refills | Status: DC

## 2016-09-17 NOTE — Telephone Encounter (Addendum)
Mickel Baas at [facility] is questioning if patient is going to continue taking stivarga. Informed [facility] that pt has follow up on 09/29/16 and at that time it will be determined if patient will need to continue [place]. New prescription will need to be submitted to [facility] with new directions and quantity if pt is to continue on stivarga.

## 2016-09-22 ENCOUNTER — Other Ambulatory Visit: Payer: Self-pay | Admitting: *Deleted

## 2016-09-22 ENCOUNTER — Other Ambulatory Visit: Payer: Commercial Managed Care - PPO

## 2016-09-22 ENCOUNTER — Ambulatory Visit: Payer: Commercial Managed Care - PPO | Admitting: Oncology

## 2016-09-22 DIAGNOSIS — C786 Secondary malignant neoplasm of retroperitoneum and peritoneum: Secondary | ICD-10-CM

## 2016-09-22 DIAGNOSIS — C772 Secondary and unspecified malignant neoplasm of intra-abdominal lymph nodes: Principal | ICD-10-CM

## 2016-09-22 DIAGNOSIS — C801 Malignant (primary) neoplasm, unspecified: Secondary | ICD-10-CM

## 2016-09-22 DIAGNOSIS — C182 Malignant neoplasm of ascending colon: Secondary | ICD-10-CM

## 2016-09-22 MED ORDER — OXYCODONE-ACETAMINOPHEN 5-325 MG PO TABS: 1.0000 | ORAL_TABLET | ORAL | 0 refills | Status: DC | PRN

## 2016-09-28 NOTE — Progress Notes (Signed)
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   INTERVAL HISTORY: Patient returns to clinic today for further evaluation and continuation of Regorafenib. He continues to be highly anxious. He is fully healed from his surgery, but now complains of 6-7/10 abdominal and right flank pain and abdominal tightness.  He reports not being able to  "completely empty out" and small amounts of stool since last Tuesday evening, when he ate some cheese.  He reports taking 2 percocet at a time when the pain gets bad, supplemented with ibuprofen to prevent constipation from taking too many opioids.  He denies any nausea, vomiting, or diarrhea.  He complains of weakness and fatigue today.  He reports having had blisters "like paper cuts" on his hands and feet, resolved with epsom salt soaks.  He has no neurologic complaints. He denies any fevers or illnesses. He reports shortness of breath with exertion and lying down.  He denies any chest pain.  He has no urinary complaints. Patient offers no further specific complaints today.  REVIEW OF SYSTEMS:   Review of Systems  Constitutional: Positive for malaise/fatigue. Negative for fever and weight loss.  HENT: Negative for congestion.   Respiratory: Positive for shortness of breath. Negative for cough.   Cardiovascular: Negative.  Negative for chest pain.  Gastrointestinal: Positive for abdominal pain and constipation. Negative for blood in stool, diarrhea, melena, nausea and vomiting.  Genitourinary: Negative.   Musculoskeletal: Negative.   Skin: Negative.     Neurological: Positive for weakness. Negative for tremors and headaches.  Psychiatric/Behavioral: Negative for depression. The patient is nervous/anxious.     As per HPI. Otherwise, a complete review of systems is negative.  PAST MEDICAL HISTORY: Past Medical History:  Diagnosis Date  . Cancer [facility])    colon cancer  . Headache    H/O  . Loosening of tooth    top front  . Shortness of breath dyspnea    on exertion    PAST SURGICAL HISTORY: Past Surgical History:  Procedure Laterality Date  . COLON RESECTION Right 07/16/2016   Procedure: COLON RESECTION;  Surgeon: [name], MD;  Location: [facility];  Service: General;  Laterality: Right;  . COLONOSCOPY WITH PROPOFOL N/A 03/06/2016   Procedure: COLONOSCOPY WITH PROPOFOL;  Surgeon: Lucilla Lame, MD;  Location: [place];  Service: Endoscopy;  Laterality: N/A;  . POLYPECTOMY  03/06/2016   Procedure: POLYPECTOMY;  Surgeon: Lucilla Lame, MD;  Location: [place];  Service: Endoscopy;;  . PORTACATH PLACEMENT Right 03/25/2016   Procedure: INSERTION PORT-A-CATH;  Surgeon: [name], MD;  Location: [facility];  Service: General;  Laterality: Right;  . Right foot surgery  2001    FAMILY HISTORY Family History  Problem Relation Age of Onset  . Pancreatic cancer Maternal Grandfather   . Lymphoma Maternal Grandmother   . CAD Father   . Heart disease Father        ADVANCED DIRECTIVES:    HEALTH MAINTENANCE: Social History  Substance Use Topics  . Smoking status: Current Every Day Smoker    Packs/day: 0.50    Years: 35.00    Types: Cigarettes  . Smokeless tobacco:  Never Used     Comment: has more than 30 pack years history  . Alcohol use No     Colonoscopy:  PAP:  Bone density:  Lipid panel:  No Known Allergies  Current Outpatient Prescriptions  Medication Sig Dispense Refill  . ALPRAZolam (XANAX) 0.5 MG tablet Take 1 tablet (0.5 mg total) by mouth 3 (three) times daily as needed for anxiety. 45  tablet 0  . ondansetron (ZOFRAN) 8 MG tablet Take 1 tablet (8 mg total) by mouth 2 (two) times daily as needed for refractory nausea / vomiting. 30 tablet 3  . oxyCODONE-acetaminophen (PERCOCET/ROXICET) 5-325 MG tablet Take 1 tablet by mouth every 4 (four) hours as needed for severe pain. 60 tablet 0  . regorafenib (STIVARGA) 40 MG tablet Take 4 tablets (160 mg total) by mouth daily with breakfast. Take for 14 days then 7 days off. Take with low fat meal. Caution: Chemotherapy. 56 tablet 5   No current facility-administered medications for this visit.    Facility-Administered Medications Ordered in Other Visits  Medication Dose Route Frequency Provider Last Rate Last Dose  . 0.9 %  sodium chloride infusion   Intravenous Once Lloyd Huger, MD      . heparin lock flush 100 unit/mL  500 Units Intracatheter Once PRN Lloyd Huger, MD      . sodium chloride flush (NS) 0.9 % injection 10 mL  10 mL Intravenous PRN Lloyd Huger, MD   10 mL at 04/09/16 1014    OBJECTIVE: Vitals:   09/29/16 1552  BP: (!) 153/98  Pulse: (!) 106  Resp: 18  Temp: 97.2 F (36.2 C)     Body mass index is 28.78 kg/m.    ECOG FS:1 - Symptomatic but completely ambulatory  General: Well-developed, well-nourished, no acute distress. Eyes: Pink conjunctiva, anicteric sclera. Lungs: Clear to auscultation bilaterally. Heart: Regular rate and rhythm. No rubs, murmurs, or gallops. Abdomen: Distended, firm, nontender. Well-healing surgical scar. Musculoskeletal: No edema, cyanosis, or clubbing. Neuro: Alert, answering all questions appropriately. Cranial nerves grossly intact. Skin: No rashes or petechiae noted. Psych: Normal affect.   LAB RESULTS:  Lab Results  Component Value Date   NA 136 09/29/2016   K 3.9 09/29/2016   CL 103 09/29/2016   CO2 25 09/29/2016   GLUCOSE 95 09/29/2016   BUN 11 09/29/2016   CREATININE 0.78 09/29/2016   CALCIUM 8.3 (L) 09/29/2016   PROT 7.3 09/29/2016   ALBUMIN  3.1 (L) 09/29/2016   AST 12 (L) 09/29/2016   ALT 8 (L) 09/29/2016   ALKPHOS 106 09/29/2016   BILITOT 0.6 09/29/2016   GFRNONAA >60 09/29/2016   GFRAA >60 09/29/2016    Lab Results  Component Value Date   WBC 8.1 09/29/2016   NEUTROABS 4.6 09/29/2016   HGB 13.6 09/29/2016   HCT 40.4 09/29/2016   MCV 81.4 09/29/2016   PLT 377 09/29/2016   Lab Results  Component Value Date   CEA 3.1 02/18/2016     STUDIES: No results found.  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  PLAN:    1. 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 Further testing of patient's pathology revealed intact and MMR proteins indicating low probability of MSI-H. Because of this, immunotherapy is not likely to be helpful. After lengthy discussion with the patient, he wishes to  proceed with second line treatment. He was given the option of using FOLFIRI or oral Regorafenib. Patient wishes to proceed with oral chemotherapy, which started 08/20/16.  Chemotherapy paused for evaluation of possible hand and foot syndrome, but symptoms are not consistent with that. Patient will restart with cycle 2 of Regorafenib. Restaging CT reviewed independently. Patient will return to clinic in 4 weeks for repeat laboratory work and further evaluation.   2. Pain: Continue percocet as needed as prescribed. 3. Diarrhea: Resolved.   4. Difficulty sleeping: Ambien has been discontinued and patient was given a prescription for Xanax. 5. Anxiety: Xanax as needed 3 times a day. Patient has refused psychiatry referral for further management of his depression and anxiety. 6. Postop surgical care: Continue follow up with surgery as indicated.  7. Abdominal distention/pain/constipation: Abdominal ultrasound paracentesis  ordered.  Patient expressed understanding and was in agreement with this plan. He also understands that He can call clinic at any time with any questions, concerns, or complaints.   Lucendia Herrlich, NP  09/29/16 5:19 PM   Patient was seen and evaluated independently and I agree with the assessment and plan as dictated above. Patient's abdomen is distended concerning for underlying ascites. This is possibly related to progression of disease, but since patient has only had 1 cycle of Regorafenib will not discontinue treatment as of yet. Proceed with cycle 2 once medication is received.  Lloyd Huger, MD 09/29/16 5:20 PM

## 2016-09-29 ENCOUNTER — Inpatient Hospital Stay (HOSPITAL_BASED_OUTPATIENT_CLINIC_OR_DEPARTMENT_OTHER): Payer: Commercial Managed Care - PPO | Admitting: Oncology

## 2016-09-29 ENCOUNTER — Inpatient Hospital Stay: Payer: Commercial Managed Care - PPO | Attending: Oncology

## 2016-09-29 VITALS — BP 153/98 | HR 106 | Temp 97.2°F | Resp 18 | Wt 194.9 lb

## 2016-09-29 DIAGNOSIS — R531 Weakness: Secondary | ICD-10-CM | POA: Diagnosis not present

## 2016-09-29 DIAGNOSIS — C772 Secondary and unspecified malignant neoplasm of intra-abdominal lymph nodes: Secondary | ICD-10-CM | POA: Insufficient documentation

## 2016-09-29 DIAGNOSIS — Z807 Family history of other malignant neoplasms of lymphoid, hematopoietic and related tissues: Secondary | ICD-10-CM

## 2016-09-29 DIAGNOSIS — F17210 Nicotine dependence, cigarettes, uncomplicated: Secondary | ICD-10-CM | POA: Diagnosis not present

## 2016-09-29 DIAGNOSIS — R5383 Other fatigue: Secondary | ICD-10-CM

## 2016-09-29 DIAGNOSIS — Z800 Family history of malignant neoplasm of digestive organs: Secondary | ICD-10-CM

## 2016-09-29 DIAGNOSIS — R109 Unspecified abdominal pain: Secondary | ICD-10-CM | POA: Insufficient documentation

## 2016-09-29 DIAGNOSIS — R0602 Shortness of breath: Secondary | ICD-10-CM | POA: Diagnosis not present

## 2016-09-29 DIAGNOSIS — F419 Anxiety disorder, unspecified: Secondary | ICD-10-CM | POA: Insufficient documentation

## 2016-09-29 DIAGNOSIS — R51 Headache: Secondary | ICD-10-CM | POA: Diagnosis not present

## 2016-09-29 DIAGNOSIS — C182 Malignant neoplasm of ascending colon: Secondary | ICD-10-CM | POA: Insufficient documentation

## 2016-09-29 DIAGNOSIS — C786 Secondary malignant neoplasm of retroperitoneum and peritoneum: Secondary | ICD-10-CM | POA: Diagnosis not present

## 2016-09-29 DIAGNOSIS — C801 Malignant (primary) neoplasm, unspecified: Secondary | ICD-10-CM

## 2016-09-29 DIAGNOSIS — K5900 Constipation, unspecified: Secondary | ICD-10-CM

## 2016-09-29 DIAGNOSIS — Z79899 Other long term (current) drug therapy: Secondary | ICD-10-CM | POA: Diagnosis not present

## 2016-09-29 LAB — COMPREHENSIVE METABOLIC PANEL
ALK PHOS: 106 U/L (ref 38–126)
ALT: 8 U/L — AB (ref 17–63)
ANION GAP: 8 (ref 5–15)
AST: 12 U/L — ABNORMAL LOW (ref 15–41)
Albumin: 3.1 g/dL — ABNORMAL LOW (ref 3.5–5.0)
BILIRUBIN TOTAL: 0.6 mg/dL (ref 0.3–1.2)
BUN: 11 mg/dL (ref 6–20)
CALCIUM: 8.3 mg/dL — AB (ref 8.9–10.3)
CO2: 25 mmol/L (ref 22–32)
CREATININE: 0.78 mg/dL (ref 0.61–1.24)
Chloride: 103 mmol/L (ref 101–111)
Glucose, Bld: 95 mg/dL (ref 65–99)
Potassium: 3.9 mmol/L (ref 3.5–5.1)
SODIUM: 136 mmol/L (ref 135–145)
TOTAL PROTEIN: 7.3 g/dL (ref 6.5–8.1)

## 2016-09-29 LAB — CBC WITH DIFFERENTIAL/PLATELET
BASOS ABS: 0 10*3/uL (ref 0–0.1)
BASOS PCT: 0 %
EOS ABS: 0.3 10*3/uL (ref 0–0.7)
EOS PCT: 3 %
HCT: 40.4 % (ref 40.0–52.0)
Hemoglobin: 13.6 g/dL (ref 13.0–18.0)
LYMPHS PCT: 32 %
Lymphs Abs: 2.6 10*3/uL (ref 1.0–3.6)
MCH: 27.4 pg (ref 26.0–34.0)
MCHC: 33.6 g/dL (ref 32.0–36.0)
MCV: 81.4 fL (ref 80.0–100.0)
Monocytes Absolute: 0.6 10*3/uL (ref 0.2–1.0)
Monocytes Relative: 7 %
Neutro Abs: 4.6 10*3/uL (ref 1.4–6.5)
Neutrophils Relative %: 58 %
PLATELETS: 377 10*3/uL (ref 150–440)
RBC: 4.96 MIL/uL (ref 4.40–5.90)
RDW: 16.9 % — ABNORMAL HIGH (ref 11.5–14.5)
WBC: 8.1 10*3/uL (ref 3.8–10.6)

## 2016-09-29 NOTE — Progress Notes (Signed)
Patient states he is constipated.  States he has had a BM 5 days in the past week but is only doing a small amount.  States he feels bloated and "needs a good cleaning out".  Asking for something more than Miralax and Ducolax.  States he did not reorder second shipment of [place].  Wants to speak to MD first.  Blood Pressure elevated.  153/98 HR 106  Recheck 140/113 HR 105.

## 2016-09-30 ENCOUNTER — Other Ambulatory Visit: Payer: Self-pay | Admitting: *Deleted

## 2016-09-30 DIAGNOSIS — C772 Secondary and unspecified malignant neoplasm of intra-abdominal lymph nodes: Principal | ICD-10-CM

## 2016-09-30 DIAGNOSIS — C182 Malignant neoplasm of ascending colon: Secondary | ICD-10-CM

## 2016-09-30 MED ORDER — REGORAFENIB 40 MG PO TABS: 160.0000 mg | ORAL_TABLET | Freq: Every day | ORAL | 5 refills | Status: DC

## 2016-10-02 ENCOUNTER — Ambulatory Visit: Admission: RE | Admit: 2016-10-02 | Payer: Commercial Managed Care - PPO | Source: Ambulatory Visit

## 2016-10-06 ENCOUNTER — Ambulatory Visit
Admission: RE | Admit: 2016-10-06 | Discharge: 2016-10-06 | Disposition: A | Discharge status: Home / Self Care | Payer: Commercial Managed Care - PPO | Source: Ambulatory Visit | Attending: Oncology | Admitting: Oncology

## 2016-10-06 DIAGNOSIS — C772 Secondary and unspecified malignant neoplasm of intra-abdominal lymph nodes: Secondary | ICD-10-CM | POA: Diagnosis not present

## 2016-10-06 DIAGNOSIS — C182 Malignant neoplasm of ascending colon: Secondary | ICD-10-CM | POA: Insufficient documentation

## 2016-10-06 DIAGNOSIS — C786 Secondary malignant neoplasm of retroperitoneum and peritoneum: Secondary | ICD-10-CM | POA: Diagnosis not present

## 2016-10-06 DIAGNOSIS — R188 Other ascites: Secondary | ICD-10-CM | POA: Diagnosis not present

## 2016-10-06 DIAGNOSIS — C801 Malignant (primary) neoplasm, unspecified: Secondary | ICD-10-CM

## 2016-10-13 ENCOUNTER — Telehealth: Payer: Self-pay | Admitting: *Deleted

## 2016-10-13 DIAGNOSIS — C182 Malignant neoplasm of ascending colon: Secondary | ICD-10-CM

## 2016-10-13 DIAGNOSIS — C772 Secondary and unspecified malignant neoplasm of intra-abdominal lymph nodes: Principal | ICD-10-CM

## 2016-10-13 NOTE — Telephone Encounter (Signed)
States still has not received stivarga in the mail from [facility]. Informed pt will contact [facility] to get an update on his prescription. Will follow up with patient after talking with [facility]. Pt verbalized understanding.

## 2016-10-14 NOTE — Telephone Encounter (Addendum)
Prescription for Andrew Ibarra is being transferred to [facility] due to [employer]. Prescription will be transferred today. Will call [facility] tomorrow to follow up. Pt has been made aware.   [facility] Ph# [phone]

## 2016-10-15 MED ORDER — REGORAFENIB 40 MG PO TABS: 160.0000 mg | ORAL_TABLET | Freq: Every day | ORAL | 5 refills | Status: DC

## 2016-10-15 NOTE — Telephone Encounter (Signed)
Spoke with rep at [facility] and they have not received prescription for stivarga. [place], they informed me that they would have to forward the presciption to [facility] since they do not stock stivarga at their pharmacy. Returned call to [facility] to get update on prescription transfer. Rep at [facility] informed me it was faxed yesterday. Informed [facility] that prescription would be sent to [facility] if received by [facility] since they do not keep it in stock. [facility] will re-fax prescription to [facility] in order for them to fax to [facility].   Stivarga has been faxed from our office directly to [facility] in order to not delay pt's treatment even further. Pt has been made aware.

## 2016-10-20 ENCOUNTER — Telehealth: Payer: Self-pay | Admitting: *Deleted

## 2016-10-20 NOTE — Telephone Encounter (Signed)
Received update from [facility] that prescription for stivarga has been approved through insurance and pt will be contacted today or tomorrow to set up delivery. Pt has been made aware. Next appt is scheduled for 10/27/16 at 11am.

## 2016-10-22 ENCOUNTER — Other Ambulatory Visit: Payer: Self-pay | Admitting: *Deleted

## 2016-10-22 ENCOUNTER — Telehealth: Payer: Self-pay | Admitting: *Deleted

## 2016-10-22 DIAGNOSIS — C786 Secondary malignant neoplasm of retroperitoneum and peritoneum: Secondary | ICD-10-CM

## 2016-10-22 DIAGNOSIS — C801 Malignant (primary) neoplasm, unspecified: Principal | ICD-10-CM

## 2016-10-22 NOTE — Telephone Encounter (Signed)
Pt called complaining of sharp stomach pain that is worse after eating food. Last BM was 2 days ago. Pt took dulcolax this morning as well. Does not complain of fluid collecting in abdomen.   Per Dr. Grayland Ormond, will check labwork for pancreatitis. Pt instructed that can come in this afternoon or tomorrow to get labs checked. Pt declined and stated will wait until appt on Monday 2/12 to get labs checked. All labs have been ordered.   Called [facility] to get update on prescription Stivarga. They have tried to call patient yesterday and were unable to leave message. Informed pt today that needs to call [facility] today. Pt verbalized understanding.

## 2016-10-24 ENCOUNTER — Emergency Department: Payer: Commercial Managed Care - PPO

## 2016-10-24 ENCOUNTER — Emergency Department
Admission: EM | Admit: 2016-10-24 | Discharge: 2016-10-24 | Disposition: A | Discharge status: Home / Self Care | Payer: Commercial Managed Care - PPO | Attending: Emergency Medicine | Admitting: Emergency Medicine

## 2016-10-24 DIAGNOSIS — R1084 Generalized abdominal pain: Secondary | ICD-10-CM | POA: Diagnosis present

## 2016-10-24 DIAGNOSIS — Z791 Long term (current) use of non-steroidal anti-inflammatories (NSAID): Secondary | ICD-10-CM | POA: Insufficient documentation

## 2016-10-24 DIAGNOSIS — R110 Nausea: Secondary | ICD-10-CM | POA: Diagnosis not present

## 2016-10-24 DIAGNOSIS — F17210 Nicotine dependence, cigarettes, uncomplicated: Secondary | ICD-10-CM | POA: Insufficient documentation

## 2016-10-24 DIAGNOSIS — K5900 Constipation, unspecified: Secondary | ICD-10-CM | POA: Insufficient documentation

## 2016-10-24 DIAGNOSIS — Z85038 Personal history of other malignant neoplasm of large intestine: Secondary | ICD-10-CM | POA: Insufficient documentation

## 2016-10-24 LAB — COMPREHENSIVE METABOLIC PANEL
ALBUMIN: 3.3 g/dL — AB (ref 3.5–5.0)
ALK PHOS: 109 U/L (ref 38–126)
ALT: 10 U/L — AB (ref 17–63)
AST: 18 U/L (ref 15–41)
Anion gap: 8 (ref 5–15)
BILIRUBIN TOTAL: 0.4 mg/dL (ref 0.3–1.2)
BUN: 14 mg/dL (ref 6–20)
CALCIUM: 8.7 mg/dL — AB (ref 8.9–10.3)
CO2: 26 mmol/L (ref 22–32)
CREATININE: 0.83 mg/dL (ref 0.61–1.24)
Chloride: 101 mmol/L (ref 101–111)
GFR calc Af Amer: >60 mL/min (ref >60)
Glucose, Bld: 102 mg/dL — ABNORMAL HIGH (ref 65–99)
Potassium: 3.8 mmol/L (ref 3.5–5.1)
Sodium: 135 mmol/L (ref 135–145)
TOTAL PROTEIN: 7.5 g/dL (ref 6.5–8.1)

## 2016-10-24 LAB — CBC
HEMATOCRIT: 38.3 % — AB (ref 40.0–52.0)
Hemoglobin: 13.1 g/dL (ref 13.0–18.0)
MCH: 27.6 pg (ref 26.0–34.0)
MCHC: 34.1 g/dL (ref 32.0–36.0)
MCV: 80.8 fL (ref 80.0–100.0)
PLATELETS: 326 10*3/uL (ref 150–440)
RBC: 4.73 MIL/uL (ref 4.40–5.90)
RDW: 16.6 % — AB (ref 11.5–14.5)
WBC: 9.2 10*3/uL (ref 3.8–10.6)

## 2016-10-24 LAB — URINALYSIS, COMPLETE (UACMP) WITH MICROSCOPIC
BACTERIA UA: NONE SEEN
Bilirubin Urine: NEGATIVE
GLUCOSE, UA: NEGATIVE mg/dL
Hgb urine dipstick: NEGATIVE
Ketones, ur: NEGATIVE mg/dL
Leukocytes, UA: NEGATIVE
NITRITE: NEGATIVE
PH: 5 (ref 5.0–8.0)
Protein, ur: NEGATIVE mg/dL
Squamous Epithelial / LPF: NONE SEEN
WBC, UA: NONE SEEN WBC/hpf (ref 0–5)

## 2016-10-24 LAB — LIPASE, BLOOD: Lipase: 15 U/L (ref 11–51)

## 2016-10-24 MED ORDER — SODIUM CHLORIDE 0.9 % IV BOLUS (SEPSIS)
500.0000 mL | Freq: Once | INTRAVENOUS | Status: AC
  Administered 2016-10-24: 500 mL via INTRAVENOUS

## 2016-10-24 MED ORDER — ONDANSETRON HCL 4 MG/2ML IJ SOLN
4.0000 mg | Freq: Once | INTRAMUSCULAR | Status: AC
  Administered 2016-10-24: 4 mg via INTRAVENOUS
  Filled 2016-10-24: qty 2

## 2016-10-24 MED ORDER — MORPHINE SULFATE (PF) 4 MG/ML IV SOLN
4.0000 mg | Freq: Once | INTRAVENOUS | Status: AC
  Administered 2016-10-24: 4 mg via INTRAVENOUS
  Filled 2016-10-24: qty 1

## 2016-10-24 MED ORDER — IOPAMIDOL (ISOVUE-300) INJECTION 61%
100.0000 mL | Freq: Once | INTRAVENOUS | Status: AC | PRN
  Administered 2016-10-24: 100 mL via INTRAVENOUS

## 2016-10-24 MED ORDER — LACTULOSE 10 GM/15ML PO SOLN: 20.0000 g | Freq: Every day | ORAL | 0 refills | Status: DC | PRN

## 2016-10-24 MED ORDER — IOPAMIDOL (ISOVUE-300) INJECTION 61%
15.0000 mL | INTRAVENOUS | Status: AC
  Administered 2016-10-24: 15 mL via ORAL

## 2016-10-24 NOTE — ED Notes (Signed)
Pt. In CT. 

## 2016-10-24 NOTE — Discharge Instructions (Signed)
1. You may take laxative as needed for bowel movements (lactulose). 2. Return to the ER for worsening symptoms, persistent vomiting, difficulty breathing or other concerns.

## 2016-10-24 NOTE — ED Triage Notes (Addendum)
Pt arrives to ED via POV in w/c for c/o generalized abdominal pain for 2 1/2 weeks. Pt reports (+) nausea, but denies vomiting or diarrhea. Pt reports currently d/x'd with Stage 4 colon and stomach cancer, currently taking oral chemo. Pt reports some SHOB, but denies CP. Pt reports draining "8 lbs of fluid of his stomach 2 weeks ago this past Monday". Pt reports taking Xanax and Percocet at 9pm last night w/o relief. Pt also states that he has a pending appt for lab work to be drawn for possible pancreatitis. Pt is A&O, in NAD, with respirations even, regular and unlabored.

## 2016-10-24 NOTE — ED Notes (Signed)
Pt asleep when rn entered room for discharge

## 2016-10-24 NOTE — ED Notes (Signed)
Pt states only able to tolerate 1 bottle of contrast. Wife states pt is bloated and doesn't want to try to drink the remaining oral contrast.

## 2016-10-24 NOTE — ED Provider Notes (Signed)
[facility] Emergency Department Provider Note   ____________________________________________   First MD Initiated Contact with Patient 10/24/16 [phone]     (approximate)  I have reviewed the triage vital signs and the nursing notes.   HISTORY  Chief Complaint Abdominal Pain    HPI Andrew Ibarra is a 51 y.o. male who presents to the ED from home with a chief complaint of generalized abdominal pain. Patient has a history of colon and stomach cancer, currently taking oral chemotherapy. Reports paracentesis 2 weeks ago; "they drained 8 pounds of fluid". Also with a history of colon resection in December. Complains of generalized abdominal pain 3 weeks, worsened over the past 3 days.Symptoms associated with nausea, no vomiting. Patient reports last bowel movement 3 days ago which is usual for him. States he is passing gas. Denies associated fever, chills, chest pain, shortness of breath, dysuria. Denies recent travel or trauma. Tried taking Xanax and Percocet at 9 PM without relief of symptoms. Nothing makes his symptoms worse.   Past Medical History:  Diagnosis Date  . Cancer [facility])    colon cancer  . Headache    H/O  . Loosening of tooth    top front  . Shortness of breath dyspnea    on exertion    Patient Active Problem List   Diagnosis Date Noted  . Protein-calorie malnutrition, severe 07/15/2016  . Colon adenocarcinoma ([place])   . Palliative care encounter   . DNR (do not resuscitate) discussion   . SBO (small bowel obstruction) 07/13/2016  . Partial small bowel obstruction 07/13/2016  . Diastasis recti 07/13/2016  . Cancer of ascending colon metastatic to intra-abdominal lymph node ([place]) 03/16/2016  . Abnormal findings-gastrointestinal tract   . Neoplasm of digestive system   . Rectal polyp   . Peritoneal carcinomatosis ([place]) 02/18/2016    Past Surgical History:  Procedure Laterality Date  . COLON RESECTION Right 07/16/2016   Procedure:  COLON RESECTION;  Surgeon: [name], MD;  Location: [facility];  Service: General;  Laterality: Right;  . COLONOSCOPY WITH PROPOFOL N/A 03/06/2016   Procedure: COLONOSCOPY WITH PROPOFOL;  Surgeon: Lucilla Lame, MD;  Location: [place];  Service: Endoscopy;  Laterality: N/A;  . POLYPECTOMY  03/06/2016   Procedure: POLYPECTOMY;  Surgeon: Lucilla Lame, MD;  Location: [place];  Service: Endoscopy;;  . PORTACATH PLACEMENT Right 03/25/2016   Procedure: INSERTION PORT-A-CATH;  Surgeon: [name], MD;  Location: [facility];  Service: General;  Laterality: Right;  . Right foot surgery  2001    Prior to Admission medications   Medication Sig Start Date End Date Taking? Authorizing Provider  ALPRAZolam Duanne Moron) 0.5 MG tablet Take 1 tablet (0.5 mg total) by mouth 3 (three) times daily as needed for anxiety. 08/20/16  Yes Lloyd Huger, MD  ibuprofen (ADVIL,MOTRIN) 200 MG tablet Take 200-800 mg by mouth every 6 (six) hours as needed.   Yes Historical Provider, MD  ondansetron (ZOFRAN) 8 MG tablet Take 1 tablet (8 mg total) by mouth 2 (two) times daily as needed for refractory nausea / vomiting. 06/18/16  Yes Lloyd Huger, MD  oxyCODONE-acetaminophen (PERCOCET/ROXICET) 5-325 MG tablet Take 1 tablet by mouth every 4 (four) hours as needed for severe pain. 09/22/16  Yes Lloyd Huger, MD  regorafenib (STIVARGA) 40 MG tablet Take 4 tablets (160 mg total) by mouth daily with breakfast. Take for 21 days then 7 days off. Take with low fat meal. Caution: Chemotherapy. 10/15/16  Yes Lloyd Huger, MD    Allergies Patient has no known allergies.  Family History  Problem Relation Age of Onset  . Pancreatic cancer Maternal Grandfather   . Lymphoma Maternal Grandmother   . CAD Father   . Heart disease Father     Social History Social History  Substance Use Topics  . Smoking status: Current Every Day Smoker    Packs/day: 0.50    Years: 35.00    Types: Cigarettes  .  Smokeless tobacco: Never Used     Comment: has more than 30 pack years history  . Alcohol use No    Review of Systems  Constitutional: No fever/chills. Eyes: No visual changes. ENT: No sore throat. Cardiovascular: Denies chest pain. Respiratory: Denies shortness of breath. Gastrointestinal: Positive for abdominal pain.  Positive for nausea, no vomiting.  No diarrhea.  Positive for constipation. Genitourinary: Negative for dysuria. Musculoskeletal: Negative for back pain. Skin: Negative for rash. Neurological: Negative for headaches, focal weakness or numbness.  10-point ROS otherwise negative.  ____________________________________________   PHYSICAL EXAM:  VITAL SIGNS: ED Triage Vitals  Enc Vitals Group     BP 10/24/16 0321 (!) 124/92     Pulse Rate 10/24/16 0321 93     Resp 10/24/16 0321 18     Temp 10/24/16 0321 97.5 F (36.4 C)     Temp Source 10/24/16 0321 Oral     SpO2 10/24/16 0321 99 %     Weight 10/24/16 0321 185 lb (83.9 kg)     Height 10/24/16 0321 5\' 9"  (1.753 m)     Head Circumference --      Peak Flow --      Pain Score 10/24/16 0322 8     Pain Loc --      Pain Edu? --      Excl. in [place]? --     Constitutional: Alert and oriented. Chronically ill appearing and in mild acute distress. Eyes: Conjunctivae are normal. PERRL. EOMI. Head: Atraumatic. Nose: No congestion/rhinnorhea. Mouth/Throat: Mucous membranes are moist.  Oropharynx non-erythematous. Neck: No stridor.   Cardiovascular: Normal rate, regular rhythm. Grossly normal heart sounds.  Good peripheral circulation. Respiratory: Normal respiratory effort.  No retractions. Lungs CTAB. Gastrointestinal: Soft with mild diffuse tenderness to palpation without rebound or guarding. No distention. No abdominal bruits. No CVA tenderness. Musculoskeletal: No lower extremity tenderness nor edema.  No joint effusions. Neurologic:  Normal speech and language. No gross focal neurologic deficits are appreciated.    Skin:  Skin is warm, dry and intact. No rash noted. Psychiatric: Mood and affect are normal. Speech and behavior are normal.  ____________________________________________   LABS (all labs ordered are listed, but only abnormal results are displayed)  Labs Reviewed  COMPREHENSIVE METABOLIC PANEL - Abnormal; Notable for the following:       Result Value   Glucose, Bld 102 (*)    Calcium 8.7 (*)    Albumin 3.3 (*)    ALT 10 (*)    All other components within normal limits  CBC - Abnormal; Notable for the following:    HCT 38.3 (*)    RDW 16.6 (*)    All other components within normal limits  LIPASE, BLOOD  URINALYSIS, COMPLETE (UACMP) WITH MICROSCOPIC   ____________________________________________  EKG  None ____________________________________________  RADIOLOGY  CT abdomen and pelvis (viewed by me, interpreted per Dr. Gerilyn Nestle):   Circumferential mass in the hepatic flexure of the colon with  adjacent mesenteric mass consistent with primary colon neoplasm.  Diffuse peritoneal carcinomatosis with increasing ascites since  previous study. Developing subcentimeter nodules in the liver and  spleen worrisome for metastatic disease. Small bilateral pleural  effusions with basilar atelectasis.   ____________________________________________   PROCEDURES  Procedure(s) performed: None  Procedures  Critical Care performed: No  ____________________________________________   INITIAL IMPRESSION / ASSESSMENT AND PLAN / ED COURSE  Pertinent labs & imaging results that were available during my care of the patient were reviewed by me and considered in my medical decision making (see chart for details).  51 year old male with a history of colon cancer who presents with a 3 week history of generalized abdominal pain and nausea. He is afebrile; lab work is unremarkable. Diffusely tender on exam. Given his colon cancer history status post resection, will obtain CT abdomen/pelvis  to evaluate for inflammatory/obstructive etiology.  Clinical Course as of Oct 25 807  Fri Oct 24, 2016  0719 Patient feeling better, walking around the room and visiting with his wife. Specimen given and will be sent to the lab. Updated them of CT imaging results. Anticipate discharge home with prescription for lactulose; prescription for antibiotic if patient has UTI. He will follow up closely with his oncologist on Monday as previously scheduled. Strict return precautions given. Both verbalize understanding and agree with plan of care. Care transferred to Dr. Jimmye Norman pending urinalysis results.  [JS]  0808 Negative urinalysis result noted. Strict return precautions given. Patient and spouse verbalize understanding and agree with plan of care.  [JS]    Clinical Course User Index [JS] Paulette Blanch, MD     ____________________________________________   FINAL CLINICAL IMPRESSION(S) / ED DIAGNOSES  Final diagnoses:  Generalized abdominal pain  Nausea  Constipation, unspecified constipation type      NEW MEDICATIONS STARTED DURING THIS VISIT:  New Prescriptions   No medications on file     Note:  This document was prepared using Dragon voice recognition software and may include unintentional dictation errors.    Paulette Blanch, MD 10/24/16 [phone]

## 2016-10-26 NOTE — Progress Notes (Signed)
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   INTERVAL HISTORY: Patient returns to clinic today for further evaluation and continuation of Regorafenib. He continues to be highly anxious. He continues to have problems with constipation and abdominal pain.  He denies any nausea, vomiting, or diarrhea.  He complains of weakness and fatigue today.  He has no neurologic complaints. He denies any fevers or illnesses. He reports shortness of breath with exertion, but denies chest pain. He has no urinary complaints. Patient offers no further specific complaints today.  REVIEW OF SYSTEMS:   Review of Systems  Constitutional: Positive for malaise/fatigue. Negative for fever and weight loss.  HENT: Negative for congestion.   Respiratory: Positive for shortness of breath. Negative for cough.   Cardiovascular: Negative.  Negative for chest pain.  Gastrointestinal: Positive for abdominal pain and constipation. Negative for blood in stool, diarrhea, melena, nausea and vomiting.  Genitourinary: Negative.   Musculoskeletal: Negative.   Skin: Negative.   Neurological: Positive for weakness. Negative for tremors and headaches.  Psychiatric/Behavioral: Negative for depression. The patient is nervous/anxious.     As per HPI. Otherwise, a complete review of systems is negative.  PAST MEDICAL HISTORY: Past Medical History:  Diagnosis Date  . Cancer [facility])    colon cancer  . Headache    H/O  . Loosening of tooth    top front  . Shortness of breath dyspnea    on exertion    PAST SURGICAL  HISTORY: Past Surgical History:  Procedure Laterality Date  . COLON RESECTION Right 07/16/2016   Procedure: COLON RESECTION;  Surgeon: [name], MD;  Location: [facility];  Service: General;  Laterality: Right;  . COLONOSCOPY WITH PROPOFOL N/A 03/06/2016   Procedure: COLONOSCOPY WITH PROPOFOL;  Surgeon: Lucilla Lame, MD;  Location: [place];  Service: Endoscopy;  Laterality: N/A;  . POLYPECTOMY  03/06/2016   Procedure: POLYPECTOMY;  Surgeon: Lucilla Lame, MD;  Location: [place];  Service: Endoscopy;;  . PORTACATH PLACEMENT Right 03/25/2016   Procedure: INSERTION PORT-A-CATH;  Surgeon: [name], MD;  Location: [facility];  Service: General;  Laterality: Right;  . Right foot surgery  2001    FAMILY HISTORY Family History  Problem Relation Age of Onset  . Pancreatic cancer Maternal Grandfather   . Lymphoma Maternal Grandmother   . CAD Father   . Heart disease Father        ADVANCED DIRECTIVES:    HEALTH MAINTENANCE: Social History  Substance Use Topics  . Smoking status: Current Every Day Smoker    Packs/day: 0.50    Years: 35.00    Types: Cigarettes  . Smokeless tobacco: Never Used     Comment: has more than 30 pack years history  . Alcohol use No     Colonoscopy:  PAP:  Bone density:  Lipid panel:  No Known Allergies  Current Outpatient Prescriptions  Medication Sig Dispense Refill  . Bisacodyl (DULCOLAX PO) Take by mouth.    Marland Kitchen ibuprofen (ADVIL,MOTRIN) 200 MG tablet Take 200-800 mg by mouth every 6 (six) hours as needed.    Marland Kitchen oxyCODONE-acetaminophen (PERCOCET/ROXICET) 5-325 MG tablet Take 1 tablet by  mouth every 4 (four) hours as needed for severe pain. 60 tablet 0  . regorafenib (STIVARGA) 40 MG tablet Take 4 tablets (160 mg total) by mouth daily with breakfast. Take for 21 days then 7 days off. Take with low fat meal. Caution: Chemotherapy. 84 tablet 5  . ALPRAZolam (XANAX) 0.5 MG tablet Take 1 tablet (0.5 mg total) by mouth 3 (three) times  daily as needed for anxiety. 45 tablet 0  . lactulose (CHRONULAC) 10 GM/15ML solution Take 30 mLs (20 g total) by mouth daily as needed for mild constipation. 120 mL 0  . ondansetron (ZOFRAN) 8 MG tablet Take 1 tablet (8 mg total) by mouth 2 (two) times daily as needed for refractory nausea / vomiting. (Patient not taking: Reported on 10/27/2016) 30 tablet 3   No current facility-administered medications for this visit.    Facility-Administered Medications Ordered in Other Visits  Medication Dose Route Frequency Provider Last Rate Last Dose  . 0.9 %  sodium chloride infusion   Intravenous Once Lloyd Huger, MD      . heparin lock flush 100 unit/mL  500 Units Intracatheter Once PRN Lloyd Huger, MD      . sodium chloride flush (NS) 0.9 % injection 10 mL  10 mL Intravenous PRN Lloyd Huger, MD   10 mL at 04/09/16 1014    OBJECTIVE: Vitals:   10/27/16 1131  BP: (!) 142/96  Pulse: 90  Resp: 18  Temp: 97.4 F (36.3 C)     Body mass index is 27.48 kg/m.    ECOG FS:1 - Symptomatic but completely ambulatory  General: Well-developed, well-nourished, no acute distress. Eyes: Pink conjunctiva, anicteric sclera. Lungs: Clear to auscultation bilaterally. Heart: Regular rate and rhythm. No rubs, murmurs, or gallops. Abdomen: Distended, firm, nontender. Well-healing surgical scar. Musculoskeletal: No edema, cyanosis, or clubbing. Neuro: Alert, answering all questions appropriately. Cranial nerves grossly intact. Skin: No rashes or petechiae noted. Psych: Normal affect.   LAB RESULTS:  Lab Results  Component Value Date   NA 135 10/24/2016   K 3.8 10/24/2016   CL 101 10/24/2016   CO2 26 10/24/2016   GLUCOSE 102 (H) 10/24/2016   BUN 14 10/24/2016   CREATININE 0.83 10/24/2016   CALCIUM 8.7 (L) 10/24/2016   PROT 7.5 10/24/2016   ALBUMIN 3.3 (L) 10/24/2016   AST 18 10/24/2016   ALT 10 (L) 10/24/2016   ALKPHOS 109 10/24/2016   BILITOT 0.4 10/24/2016   GFRNONAA >60  10/24/2016   GFRAA >60 10/24/2016    Lab Results  Component Value Date   WBC 9.2 10/24/2016   NEUTROABS 4.6 09/29/2016   HGB 13.1 10/24/2016   HCT 38.3 (L) 10/24/2016   MCV 80.8 10/24/2016   PLT 326 10/24/2016   Lab Results  Component Value Date   CEA 3.1 02/18/2016     STUDIES: Ct Abdomen Pelvis W Contrast  Result Date: 10/24/2016 CLINICAL DATA:  Abdominal pain, nausea, constipation. Generalized abdominal pain for 2.5 weeks. Current diagnosis of stage IV colon and stomach cancer on chemotherapy. EXAM: CT ABDOMEN AND PELVIS WITH CONTRAST TECHNIQUE: Multidetector CT imaging of the abdomen and pelvis was performed using the standard protocol following bolus administration of intravenous contrast. CONTRAST:  125m ISOVUE-300 IOPAMIDOL (ISOVUE-300) INJECTION 61% COMPARISON:  08/18/2016 FINDINGS: Lower chest: Small bilateral pleural effusions with basilar atelectasis. Hepatobiliary: Scattered low-attenuation lesions in the liver, largest in the dome measuring 2.2 cm diameter. This is unchanged since the previous study, but there are small subcentimeter nodules which  were not definitely present previously, likely representing progressive metastatic disease. Gallbladder is not abnormally distended. Bile ducts are not dilated. Pancreas: Unremarkable. No pancreatic ductal dilatation or surrounding inflammatory changes. Spleen: Subcentimeter low-attenuation lesions in the spleen or not definitely present previously and could represent developing splenic metastasis. Adrenals/Urinary Tract: Adrenal glands are unremarkable. Kidneys are normal, without renal calculi, focal lesion, or hydronephrosis. Bladder is unremarkable. Stomach/Bowel: Stomach, small bowel, and colon are not abnormally distended. Scattered gas and stool throughout the colon. There is a circumferential mass in the hepatic flexure of the colon likely representing the patient's known colon neoplasm. The mass extends into the adjacent  mesenteric with maximal measurement of 5.5 x 6.4 cm. Vascular/Lymphatic: Scattered calcifications in the abdominal aorta. No aneurysm. Inferior vena cava is flattened, possibly representing hypovolemia. Moderately enlarged lymph nodes are present throughout the retroperitoneum with multiple enlarged mesenteric lymph nodes, likely metastatic. No definite progression since previous study. Reproductive: Prostate is unremarkable. Other: Moderately large amount of ascites demonstrated throughout the abdomen and pelvis. Diffuse nodular infiltration of the mesenteric and omentum likely representing peritoneal carcinomatosis. Ascites is increased since previous study. No free air. Musculoskeletal: Degenerative changes in the spine. No destructive bone lesions. IMPRESSION: Circumferential mass in the hepatic flexure of the colon with adjacent mesenteric mass consistent with primary colon neoplasm. Diffuse peritoneal carcinomatosis with increasing ascites since previous study. Developing subcentimeter nodules in the liver and spleen worrisome for metastatic disease. Small bilateral pleural effusions with basilar atelectasis. Electronically Signed   By: Lucienne Capers M.D.   On: 10/24/2016 06:50   US Paracentesis  Result Date: 10/06/2016 INDICATION: History of colon carcinoma with peritoneal carcinomatosis and ascites. EXAM: ULTRASOUND GUIDED PARACENTESIS MEDICATIONS: None. COMPLICATIONS: None immediate. PROCEDURE: Informed written consent was obtained from the patient after a discussion of the risks, benefits and alternatives to treatment. A timeout was performed prior to the initiation of the procedure. Initial ultrasound was performed to localize ascites. The right lower abdomen was prepped and draped in the usual sterile fashion. 1% lidocaine was used for local anesthesia. Following this, a 6 Fr Safe-T-Centesis catheter was introduced. An ultrasound image was saved for documentation purposes. The paracentesis was  performed. The catheter was removed and a dressing was applied. The patient tolerated the procedure well without immediate post procedural complication. FINDINGS: A total of approximately 3.6 L of yellowish fluid was removed. IMPRESSION: Successful ultrasound-guided paracentesis yielding 3.6 liters of peritoneal fluid. Electronically Signed   By: Aletta Edouard M.D.   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  PLAN:    1. 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 Further testing of patient's pathology revealed intact and MMR proteins indicating low probability of MSI-H. Because of this, immunotherapy is not likely to be helpful. Proceed with cycle 2 of Regorafenib starting today. Patient takes treatment for 21 days with 7 day break. Patient will return to clinic in 3 weeks for repeat laboratory work and further evaluation.   2. Pain: Continue percocet as needed as prescribed. 3. Constipation: Improving, continue lactulose and OTC treatments as prescribed..   4. Difficulty sleeping: Ambien has been discontinued and patient was given a prescription for Xanax. 5. Anxiety: Xanax as needed 3 times a day. Patient has refused psychiatry referral for further management of his depression and anxiety. 6. Postop surgical  care: Continue follow up with surgery as indicated.  7. Abdominal distention/pain/constipation: Recent last received paracentesis on October 06, 2016 were 3.6 L of peritoneal fluid was removed.  Patient expressed understanding and was in agreement with this plan. He also understands that He can call clinic at any time with any questions, concerns, or complaints.    Lloyd Huger, MD 10/29/16 1:00 PM

## 2016-10-27 ENCOUNTER — Telehealth: Payer: Self-pay | Admitting: *Deleted

## 2016-10-27 ENCOUNTER — Inpatient Hospital Stay: Payer: Commercial Managed Care - PPO | Attending: Oncology

## 2016-10-27 ENCOUNTER — Inpatient Hospital Stay (HOSPITAL_BASED_OUTPATIENT_CLINIC_OR_DEPARTMENT_OTHER): Payer: Commercial Managed Care - PPO | Admitting: Oncology

## 2016-10-27 VITALS — BP 142/96 | HR 90 | Temp 97.4°F | Resp 18 | Wt 186.1 lb

## 2016-10-27 DIAGNOSIS — F419 Anxiety disorder, unspecified: Secondary | ICD-10-CM | POA: Insufficient documentation

## 2016-10-27 DIAGNOSIS — R109 Unspecified abdominal pain: Secondary | ICD-10-CM | POA: Insufficient documentation

## 2016-10-27 DIAGNOSIS — R531 Weakness: Secondary | ICD-10-CM | POA: Insufficient documentation

## 2016-10-27 DIAGNOSIS — C801 Malignant (primary) neoplasm, unspecified: Secondary | ICD-10-CM

## 2016-10-27 DIAGNOSIS — Z79899 Other long term (current) drug therapy: Secondary | ICD-10-CM | POA: Diagnosis not present

## 2016-10-27 DIAGNOSIS — Z800 Family history of malignant neoplasm of digestive organs: Secondary | ICD-10-CM

## 2016-10-27 DIAGNOSIS — J90 Pleural effusion, not elsewhere classified: Secondary | ICD-10-CM | POA: Diagnosis not present

## 2016-10-27 DIAGNOSIS — C786 Secondary malignant neoplasm of retroperitoneum and peritoneum: Secondary | ICD-10-CM | POA: Insufficient documentation

## 2016-10-27 DIAGNOSIS — G4700 Insomnia, unspecified: Secondary | ICD-10-CM

## 2016-10-27 DIAGNOSIS — R51 Headache: Secondary | ICD-10-CM | POA: Insufficient documentation

## 2016-10-27 DIAGNOSIS — R5383 Other fatigue: Secondary | ICD-10-CM

## 2016-10-27 DIAGNOSIS — R0602 Shortness of breath: Secondary | ICD-10-CM | POA: Diagnosis not present

## 2016-10-27 DIAGNOSIS — Z01812 Encounter for preprocedural laboratory examination: Secondary | ICD-10-CM

## 2016-10-27 DIAGNOSIS — C182 Malignant neoplasm of ascending colon: Secondary | ICD-10-CM | POA: Diagnosis present

## 2016-10-27 DIAGNOSIS — F17210 Nicotine dependence, cigarettes, uncomplicated: Secondary | ICD-10-CM | POA: Insufficient documentation

## 2016-10-27 DIAGNOSIS — K5900 Constipation, unspecified: Secondary | ICD-10-CM | POA: Insufficient documentation

## 2016-10-27 DIAGNOSIS — C772 Secondary and unspecified malignant neoplasm of intra-abdominal lymph nodes: Secondary | ICD-10-CM

## 2016-10-27 MED ORDER — OXYCODONE-ACETAMINOPHEN 5-325 MG PO TABS: 1.0000 | ORAL_TABLET | ORAL | 0 refills | Status: DC | PRN

## 2016-10-27 MED ORDER — LACTULOSE 10 GM/15ML PO SOLN: 10.0000 g | Freq: Two times a day (BID) | ORAL | 0 refills | Status: DC | PRN

## 2016-10-27 MED ORDER — ALPRAZOLAM 0.5 MG PO TABS: 0.5000 mg | ORAL_TABLET | Freq: Three times a day (TID) | ORAL | 0 refills | Status: DC | PRN

## 2016-10-27 NOTE — Telephone Encounter (Signed)
Notified patient that Xanax prescription and been sent to pharmacy.

## 2016-10-28 ENCOUNTER — Other Ambulatory Visit: Payer: Self-pay | Admitting: *Deleted

## 2016-10-28 ENCOUNTER — Telehealth: Payer: Self-pay | Admitting: *Deleted

## 2016-10-28 MED ORDER — LACTULOSE 10 GM/15ML PO SOLN: 20.0000 g | Freq: Every day | ORAL | 0 refills | Status: AC | PRN

## 2016-10-28 NOTE — Telephone Encounter (Signed)
Patient notified and verbalized understanding. 

## 2016-10-28 NOTE — Telephone Encounter (Signed)
Notified patient that lactulos had been sent to [facility]. Corrected.

## 2016-10-30 ENCOUNTER — Emergency Department: Payer: Commercial Managed Care - PPO

## 2016-10-30 ENCOUNTER — Encounter: Payer: Self-pay | Admitting: Emergency Medicine

## 2016-10-30 ENCOUNTER — Emergency Department
Admission: EM | Admit: 2016-10-30 | Discharge: 2016-10-30 | Disposition: A | Discharge status: Home / Self Care | Payer: Commercial Managed Care - PPO | Attending: Emergency Medicine | Admitting: Emergency Medicine

## 2016-10-30 ENCOUNTER — Telehealth: Payer: Self-pay | Admitting: *Deleted

## 2016-10-30 DIAGNOSIS — F17210 Nicotine dependence, cigarettes, uncomplicated: Secondary | ICD-10-CM | POA: Insufficient documentation

## 2016-10-30 DIAGNOSIS — K5900 Constipation, unspecified: Secondary | ICD-10-CM | POA: Diagnosis present

## 2016-10-30 DIAGNOSIS — K5903 Drug induced constipation: Secondary | ICD-10-CM | POA: Insufficient documentation

## 2016-10-30 DIAGNOSIS — Z85038 Personal history of other malignant neoplasm of large intestine: Secondary | ICD-10-CM | POA: Insufficient documentation

## 2016-10-30 LAB — COMPREHENSIVE METABOLIC PANEL
ALT: 9 U/L — ABNORMAL LOW (ref 17–63)
AST: 15 U/L (ref 15–41)
Albumin: 3.4 g/dL — ABNORMAL LOW (ref 3.5–5.0)
Alkaline Phosphatase: 135 U/L — ABNORMAL HIGH (ref 38–126)
Anion gap: 7 (ref 5–15)
BUN: 11 mg/dL (ref 6–20)
CHLORIDE: 103 mmol/L (ref 101–111)
CO2: 28 mmol/L (ref 22–32)
Calcium: 8.6 mg/dL — ABNORMAL LOW (ref 8.9–10.3)
Creatinine, Ser: 0.8 mg/dL (ref 0.61–1.24)
Glucose, Bld: 95 mg/dL (ref 65–99)
POTASSIUM: 4 mmol/L (ref 3.5–5.1)
Sodium: 138 mmol/L (ref 135–145)
Total Bilirubin: 0.3 mg/dL (ref 0.3–1.2)
Total Protein: 7.5 g/dL (ref 6.5–8.1)

## 2016-10-30 LAB — CBC
HEMATOCRIT: 39.5 % — AB (ref 40.0–52.0)
HEMOGLOBIN: 13.5 g/dL (ref 13.0–18.0)
MCH: 27.6 pg (ref 26.0–34.0)
MCHC: 34.3 g/dL (ref 32.0–36.0)
MCV: 80.4 fL (ref 80.0–100.0)
PLATELETS: 302 10*3/uL (ref 150–440)
RBC: 4.9 MIL/uL (ref 4.40–5.90)
RDW: 16.5 % — ABNORMAL HIGH (ref 11.5–14.5)
WBC: 8.8 10*3/uL (ref 3.8–10.6)

## 2016-10-30 LAB — LIPASE, BLOOD: LIPASE: 12 U/L (ref 11–51)

## 2016-10-30 MED ORDER — POLYETHYLENE GLYCOL 3350 17 G PO PACK: 17.0000 g | PACK | Freq: Every day | ORAL | 0 refills | Status: AC

## 2016-10-30 MED ORDER — SORBITOL 70 % SOLN
960.0000 mL | TOPICAL_OIL | Freq: Once | ORAL | Status: AC
  Administered 2016-10-30: 960 mL via RECTAL
  Filled 2016-10-30 (×4): qty 240

## 2016-10-30 NOTE — Telephone Encounter (Signed)
Called to report that he still has not had a bm after half a bottle of lactulose. He is passing gas but no stool. He is uncomfortable. Please advise

## 2016-10-30 NOTE — ED Provider Notes (Signed)
Time Seen: Approximately 1600  I have reviewed the triage notes  Chief Complaint: Constipation   History of Present Illness: Andrew Ibarra is a 51 y.o. male *who presents with feelings of constipation. Patient has a significant history of diffuse metastatic colon cancer. He is currently on chemotherapy. On chronic pain control. Patient denies any nausea or vomiting. He states he has had a normal bowel movement approximately 7 days. He was here approximately 5 days ago and had a CAT scan done at that time which showed metastatic disease but no evidence of a bowel obstruction or surgical emergency at this time. She has been able to maintain food and fluid intake and is still passing gas. Denies any significant low back pain, leg weakness or difficulty with urination. \Patient states he has tried stool softeners, magnesium citrate, lactulose, without successful relief Past Medical History:  Diagnosis Date  . Cancer [facility])    colon cancer  . Headache    H/O  . Loosening of tooth    top front  . Shortness of breath dyspnea    on exertion    Patient Active Problem List   Diagnosis Date Noted  . Protein-calorie malnutrition, severe 07/15/2016  . Colon adenocarcinoma ([place])   . Palliative care encounter   . DNR (do not resuscitate) discussion   . SBO (small bowel obstruction) 07/13/2016  . Partial small bowel obstruction 07/13/2016  . Diastasis recti 07/13/2016  . Cancer of ascending colon metastatic to intra-abdominal lymph node ([place]) 03/16/2016  . Abnormal findings-gastrointestinal tract   . Neoplasm of digestive system   . Rectal polyp   . Peritoneal carcinomatosis ([place]) 02/18/2016    Past Surgical History:  Procedure Laterality Date  . COLON RESECTION Right 07/16/2016   Procedure: COLON RESECTION;  Surgeon: [name], MD;  Location: [facility];  Service: General;  Laterality: Right;  . COLONOSCOPY WITH PROPOFOL N/A 03/06/2016   Procedure: COLONOSCOPY WITH PROPOFOL;  Surgeon:  Lucilla Lame, MD;  Location: [place];  Service: Endoscopy;  Laterality: N/A;  . POLYPECTOMY  03/06/2016   Procedure: POLYPECTOMY;  Surgeon: Lucilla Lame, MD;  Location: [place];  Service: Endoscopy;;  . PORTACATH PLACEMENT Right 03/25/2016   Procedure: INSERTION PORT-A-CATH;  Surgeon: [name], MD;  Location: [facility];  Service: General;  Laterality: Right;  . Right foot surgery  2001    Past Surgical History:  Procedure Laterality Date  . COLON RESECTION Right 07/16/2016   Procedure: COLON RESECTION;  Surgeon: [name], MD;  Location: [facility];  Service: General;  Laterality: Right;  . COLONOSCOPY WITH PROPOFOL N/A 03/06/2016   Procedure: COLONOSCOPY WITH PROPOFOL;  Surgeon: Lucilla Lame, MD;  Location: [place];  Service: Endoscopy;  Laterality: N/A;  . POLYPECTOMY  03/06/2016   Procedure: POLYPECTOMY;  Surgeon: Lucilla Lame, MD;  Location: [place];  Service: Endoscopy;;  . PORTACATH PLACEMENT Right 03/25/2016   Procedure: INSERTION PORT-A-CATH;  Surgeon: [name], MD;  Location: [facility];  Service: General;  Laterality: Right;  . Right foot surgery  2001    Current Outpatient Rx  . Order #: [id number] Class: Print  . Order #: [id number] Class: Historical Med  . Order #: [id number] Class: Historical Med  . Order #: [id number] Class: Normal  . Order #: [id number] Class: Normal  . Order #: [id number] Class: Print  . Order #: [id number] Class: Print    Allergies:  Patient has no known allergies.  Family History: Family History  Problem Relation Age of Onset  .  Pancreatic cancer Maternal Grandfather   . Lymphoma Maternal Grandmother   . CAD Father   . Heart disease Father     Social History: Social History  Substance Use Topics  . Smoking status: Current Every Day Smoker    Packs/day: 0.50    Years: 35.00    Types: Cigarettes  . Smokeless tobacco: Never Used     Comment: has more than 30 pack years history  . Alcohol use No      Review of Systems:   10 point review of systems was performed and was otherwise negative:  Constitutional: No fever Eyes: No visual disturbances ENT: No sore throat, ear pain Cardiac: No chest pain Respiratory: No shortness of breath, wheezing, or stridor Abdomen:Diffuse crampy abdominal discomfort without nausea or vomiting. States constipation with no preexistent melena or hematochezia Endocrine: No weight loss, No night sweats Extremities: No peripheral edema, cyanosis Skin: No rashes, easy bruising Neurologic: No focal weakness, trouble with speech or swollowing Urologic: No dysuria, Hematuria, or urinary frequency   Physical Exam:  ED Triage Vitals  Enc Vitals Group     BP 10/30/16 1323 129/87     Pulse Rate 10/30/16 1323 93     Resp 10/30/16 1323 18     Temp 10/30/16 1323 97.9 F (36.6 C)     Temp Source 10/30/16 1323 Oral     SpO2 10/30/16 1323 98 %     Weight 10/30/16 1324 186 lb (84.4 kg)     Height 10/30/16 1324 5\' 9"  (1.753 m)     Head Circumference --      Peak Flow --      Pain Score 10/30/16 1324 8     Pain Loc --      Pain Edu? --      Excl. in [place]? --     General: Awake , Alert , and Oriented times 3; GCS 15 Head: Normal cephalic , atraumatic Eyes: Pupils equal , round, reactive to light Nose/Throat: No nasal drainage, patent upper airway without erythema or exudate.  Neck: Supple, Full range of motion, No anterior adenopathy or palpable thyroid masses Lungs: Clear to ascultation without wheezes , rhonchi, or rales Heart: Regular rate, regular rhythm without murmurs , gallops , or rubs AbdomenAbdomen is mildly distended with bowel sounds positive to hyperactive in all 4 quadrants. No peritoneal signs with an old well-healed midline surgical scar, guarding , or rigidity; bowel sounds positive and symmetric in all 4 quadrants. No organomegaly .        Extremities: 2 plus symmetric pulses. No edema, clubbing or cyanosis Neurologic: normal ambulation,  Motor symmetric without deficits, sensory intact Skin: warm, dry, no rashes   Labs:   All laboratory work was reviewed including any pertinent negatives or positives listed below:  Labs Reviewed  COMPREHENSIVE METABOLIC PANEL - Abnormal; Notable for the following:       Result Value   Calcium 8.6 (*)    Albumin 3.4 (*)    ALT 9 (*)    Alkaline Phosphatase 135 (*)    All other components within normal limits  CBC - Abnormal; Notable for the following:    HCT 39.5 (*)    RDW 16.5 (*)    All other components within normal limits  LIPASE, BLOOD  Laboratory work was reviewed and showed no clinically significant abnormalities.   Radiology: * "Ct Abdomen Pelvis W Contrast  Result Date: 10/24/2016 CLINICAL DATA:  Abdominal pain, nausea, constipation. Generalized abdominal pain for 2.5 weeks.  Current diagnosis of stage IV colon and stomach cancer on chemotherapy. EXAM: CT ABDOMEN AND PELVIS WITH CONTRAST TECHNIQUE: Multidetector CT imaging of the abdomen and pelvis was performed using the standard protocol following bolus administration of intravenous contrast. CONTRAST:  135mL ISOVUE-300 IOPAMIDOL (ISOVUE-300) INJECTION 61% COMPARISON:  08/18/2016 FINDINGS: Lower chest: Small bilateral pleural effusions with basilar atelectasis. Hepatobiliary: Scattered low-attenuation lesions in the liver, largest in the dome measuring 2.2 cm diameter. This is unchanged since the previous study, but there are small subcentimeter nodules which were not definitely present previously, likely representing progressive metastatic disease. Gallbladder is not abnormally distended. Bile ducts are not dilated. Pancreas: Unremarkable. No pancreatic ductal dilatation or surrounding inflammatory changes. Spleen: Subcentimeter low-attenuation lesions in the spleen or not definitely present previously and could represent developing splenic metastasis. Adrenals/Urinary Tract: Adrenal glands are unremarkable. Kidneys are normal,  without renal calculi, focal lesion, or hydronephrosis. Bladder is unremarkable. Stomach/Bowel: Stomach, small bowel, and colon are not abnormally distended. Scattered gas and stool throughout the colon. There is a circumferential mass in the hepatic flexure of the colon likely representing the patient's known colon neoplasm. The mass extends into the adjacent mesenteric with maximal measurement of 5.5 x 6.4 cm. Vascular/Lymphatic: Scattered calcifications in the abdominal aorta. No aneurysm. Inferior vena cava is flattened, possibly representing hypovolemia. Moderately enlarged lymph nodes are present throughout the retroperitoneum with multiple enlarged mesenteric lymph nodes, likely metastatic. No definite progression since previous study. Reproductive: Prostate is unremarkable. Other: Moderately large amount of ascites demonstrated throughout the abdomen and pelvis. Diffuse nodular infiltration of the mesenteric and omentum likely representing peritoneal carcinomatosis. Ascites is increased since previous study. No free air. Musculoskeletal: Degenerative changes in the spine. No destructive bone lesions. IMPRESSION: Circumferential mass in the hepatic flexure of the colon with adjacent mesenteric mass consistent with primary colon neoplasm. Diffuse peritoneal carcinomatosis with increasing ascites since previous study. Developing subcentimeter nodules in the liver and spleen worrisome for metastatic disease. Small bilateral pleural effusions with basilar atelectasis. Electronically Signed   By: Lucienne Capers M.D.   On: 10/24/2016 06:50   US Paracentesis  Result Date: 10/06/2016 INDICATION: History of colon carcinoma with peritoneal carcinomatosis and ascites. EXAM: ULTRASOUND GUIDED PARACENTESIS MEDICATIONS: None. COMPLICATIONS: None immediate. PROCEDURE: Informed written consent was obtained from the patient after a discussion of the risks, benefits and alternatives to treatment. A timeout was performed  prior to the initiation of the procedure. Initial ultrasound was performed to localize ascites. The right lower abdomen was prepped and draped in the usual sterile fashion. 1% lidocaine was used for local anesthesia. Following this, a 6 Fr Safe-T-Centesis catheter was introduced. An ultrasound image was saved for documentation purposes. The paracentesis was performed. The catheter was removed and a dressing was applied. The patient tolerated the procedure well without immediate post procedural complication. FINDINGS: A total of approximately 3.6 L of yellowish fluid was removed. IMPRESSION: Successful ultrasound-guided paracentesis yielding 3.6 liters of peritoneal fluid. Electronically Signed   By: Aletta Edouard M.D.   On: 10/06/2016 15:53   Dg Abd Acute W/chest  Result Date: 10/30/2016 CLINICAL DATA:  Constipation, colon cancer EXAM: DG ABDOMEN ACUTE W/ 1V CHEST COMPARISON:  10/24/2016 FINDINGS: Cardiomediastinal silhouette is unremarkable. No infiltrate or pulmonary edema. On there is right IJ Port-A-Cath with tip in upper SVC. Limited study by patient's large body habitus. Mild gaseous distended small bowel loops mid abdomen probable mild ileus. Abundant stool noted in right colon and transverse colon. No evidence of free abdominal air. Significant  stool within rectum measures 9.2 cm in diameter. IMPRESSION: 1. No acute disease within chest.  Right IJ Port-A-Cath in place. 2. Mild gaseous distended small bowel loops mid abdomen probable ileus. Abundant stool noted in right colon and transverse colon. Significant stool noted within rectum. The rectum measures 9.2 cm in diameter highly suspicious for fecal impaction. Electronically Signed   By: Lahoma Crocker M.D.   On: 10/30/2016 16:17  "  I personally reviewed the radiologic studies    ED Course:  Patient received a smog enema with significant results. I felt this was unlikely to be neurogenic constipation. Most likely functional given his history of  colon cancer along with narcotic pain medication etc.     Assessment:  Constipation     Plan:  Outpatient " New Prescriptions   POLYETHYLENE GLYCOL (MIRALAX) PACKET    Take 17 g by mouth daily.  " Patient was advised to return immediately if condition worsens. Patient was advised to follow up with their primary care physician or other specialized physicians involved in their outpatient care. The patient and/or family member/power of attorney had laboratory results reviewed at the bedside. All questions and concerns were addressed and appropriate discharge instructions were distributed by the nursing staff.             Daymon Larsen, MD 10/30/16 [phone]

## 2016-10-30 NOTE — Telephone Encounter (Signed)
Per Dr Grayland Ormond, can try Magnesium Citrate and if it does not work, he will need to got to ER for an enema and digital removal. Patient advised of this and repeated back to me Magnesium citrate and to go to ER if it does not work also stating "that sounds rough"

## 2016-10-30 NOTE — Discharge Instructions (Signed)
Please return immediately if condition worsens. Please contact her primary physician or the physician you were given for referral. If you have any specialist physicians involved in her treatment and plan please also contact them. Thank you for using [facility]. ° °

## 2016-10-30 NOTE — ED Triage Notes (Signed)
Seen here last week with constipation.  h as used dulcolax, mag citrate, lactulose without passing stool.  Says he is passing gas.  Has colon cancer and has been on narcotics.

## 2016-11-16 NOTE — Progress Notes (Signed)
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  INTERVAL HISTORY: Patient returns to clinic today for further evaluation and continuation of Regorafenib. He continues to be highly anxious. He continues to have problems with constipation and abdominal pain.  He denies any nausea, vomiting, or diarrhea.  He complains of weakness and fatigue today.  He complains of numbness in his pain particularly in his right leg below the knee. He has no other neurologic complaints. He denies any fevers or illnesses. He reports shortness of breath with exertion, but denies chest pain. He has no urinary complaints. Patient offers no further specific complaints today.  REVIEW OF SYSTEMS:   Review of Systems  Constitutional: Positive for malaise/fatigue. Negative for fever and weight loss.  HENT: Negative for congestion.   Respiratory: Positive for shortness of breath. Negative for cough.   Cardiovascular: Negative.  Negative for chest pain.  Gastrointestinal: Positive for abdominal pain and constipation. Negative for blood in stool, diarrhea, melena, nausea and vomiting.  Genitourinary: Negative.   Musculoskeletal: Negative.   Skin: Negative.   Neurological: Positive for weakness. Negative for tremors and headaches.  Psychiatric/Behavioral: Negative for depression. The patient is nervous/anxious.     As per HPI. Otherwise, a complete review of systems is negative.  PAST MEDICAL HISTORY: Past Medical History:  Diagnosis Date  . Cancer [facility])    colon cancer  . Headache    H/O  . Loosening of tooth    top front  . Shortness of breath dyspnea    on exertion    PAST SURGICAL HISTORY: Past Surgical History:  Procedure Laterality Date  . COLON RESECTION Right 07/16/2016   Procedure: COLON RESECTION;  Surgeon: [name], MD;  Location: [facility];  Service: General;  Laterality: Right;  . COLONOSCOPY WITH PROPOFOL N/A 03/06/2016   Procedure: COLONOSCOPY WITH PROPOFOL;  Surgeon: Lucilla Lame, MD;  Location: [place];  Service: Endoscopy;  Laterality: N/A;  . POLYPECTOMY  03/06/2016   Procedure: POLYPECTOMY;  Surgeon: Lucilla Lame, MD;  Location: [place];  Service: Endoscopy;;  . PORTACATH PLACEMENT Right 03/25/2016   Procedure: INSERTION PORT-A-CATH;  Surgeon: [name], MD;  Location: [facility];  Service: General;  Laterality: Right;  . Right foot surgery  2001    FAMILY HISTORY Family History  Problem Relation Age of Onset  . Pancreatic cancer Maternal Grandfather   . Lymphoma Maternal Grandmother   . CAD Father   . Heart disease Father        ADVANCED DIRECTIVES:    HEALTH MAINTENANCE: Social History  Substance Use Topics  . Smoking status: Current Every Day Smoker    Packs/day: 0.50    Years: 35.00    Types: Cigarettes  . Smokeless tobacco: Never Used     Comment: has more than 30 pack years history  . Alcohol use No     Colonoscopy:  PAP:  Bone density:  Lipid panel:  No Known Allergies  Current Outpatient Prescriptions  Medication Sig Dispense Refill  . ALPRAZolam (XANAX) 0.5 MG tablet Take 1 tablet (0.5 mg total) by mouth 3 (three) times daily as needed for anxiety. 45 tablet 0  .  Bisacodyl (DULCOLAX PO) Take by mouth.    Marland Kitchen ibuprofen (ADVIL,MOTRIN) 200 MG tablet Take 200-800 mg by mouth every 6 (six) hours as needed.    . lactulose (CHRONULAC) 10 GM/15ML solution Take 30 mLs (20 g total) by mouth daily as needed for mild constipation. 120 mL 0  . oxyCODONE-acetaminophen (PERCOCET/ROXICET) 5-325 MG tablet Take 1 tablet by mouth every 4  (four) hours as needed for severe pain. 60 tablet 0  . polyethylene glycol (MIRALAX) packet Take 17 g by mouth daily. 14 each 0  . ondansetron (ZOFRAN) 8 MG tablet Take 1 tablet (8 mg total) by mouth 2 (two) times daily as needed for refractory nausea / vomiting. (Patient not taking: Reported on 10/27/2016) 30 tablet 3  . regorafenib (STIVARGA) 40 MG tablet Take 4 tablets (160 mg total) by mouth daily with breakfast. Take for 21 days then 7 days off. Take with low fat meal. Caution: Chemotherapy. (Patient not taking: Reported on 11/17/2016) 84 tablet 5   No current facility-administered medications for this visit.    Facility-Administered Medications Ordered in Other Visits  Medication Dose Route Frequency Provider Last Rate Last Dose  . 0.9 %  sodium chloride infusion   Intravenous Once Lloyd Huger, MD      . heparin lock flush 100 unit/mL  500 Units Intracatheter Once PRN Lloyd Huger, MD      . sodium chloride flush (NS) 0.9 % injection 10 mL  10 mL Intravenous PRN Lloyd Huger, MD   10 mL at 04/09/16 1014    OBJECTIVE: Vitals:   11/17/16 1048  BP: (!) 143/83  Pulse: (!) 106  Temp: (!) 96.7 F (35.9 C)     Body mass index is 27.02 kg/m.    ECOG FS:1 - Symptomatic but completely ambulatory  General: Well-developed, well-nourished, no acute distress. Eyes: Pink conjunctiva, anicteric sclera. Lungs: Clear to auscultation bilaterally. Heart: Regular rate and rhythm. No rubs, murmurs, or gallops. Abdomen: Distended, firm, nontender. Well-healing surgical scar. Musculoskeletal: No edema, cyanosis, or clubbing. Neuro: Alert, answering all questions appropriately. Cranial nerves grossly intact. Skin: No rashes or petechiae noted. Psych: Normal affect.   LAB RESULTS:  Lab Results  Component Value Date   NA 137 11/17/2016   K 3.8 11/17/2016   CL 101 11/17/2016   CO2 27 11/17/2016   GLUCOSE 92 11/17/2016   BUN 13 11/17/2016   CREATININE 0.80 11/17/2016   CALCIUM  8.7 (L) 11/17/2016   PROT 7.8 11/17/2016   ALBUMIN 3.5 11/17/2016   AST 76 (H) 11/17/2016   ALT 48 11/17/2016   ALKPHOS 596 (H) 11/17/2016   BILITOT 0.7 11/17/2016   GFRNONAA >60 11/17/2016   GFRAA >60 11/17/2016    Lab Results  Component Value Date   WBC 9.0 11/17/2016   NEUTROABS 6.0 11/17/2016   HGB 14.4 11/17/2016   HCT 42.0 11/17/2016   MCV 79.9 (L) 11/17/2016   PLT 339 11/17/2016   Lab Results  Component Value Date   CEA 3.1 02/18/2016     STUDIES: Ct Abdomen Pelvis W Contrast  Result Date: 10/24/2016 CLINICAL DATA:  Abdominal pain, nausea, constipation. Generalized abdominal pain for 2.5 weeks. Current diagnosis of stage IV colon and stomach cancer on chemotherapy. EXAM: CT ABDOMEN AND PELVIS WITH CONTRAST TECHNIQUE: Multidetector CT imaging of the abdomen and pelvis was performed using the standard protocol following bolus administration of intravenous contrast. CONTRAST:  168m ISOVUE-300 IOPAMIDOL (ISOVUE-300) INJECTION 61% COMPARISON:  08/18/2016 FINDINGS: Lower chest: Small bilateral pleural effusions  with basilar atelectasis. Hepatobiliary: Scattered low-attenuation lesions in the liver, largest in the dome measuring 2.2 cm diameter. This is unchanged since the previous study, but there are small subcentimeter nodules which were not definitely present previously, likely representing progressive metastatic disease. Gallbladder is not abnormally distended. Bile ducts are not dilated. Pancreas: Unremarkable. No pancreatic ductal dilatation or surrounding inflammatory changes. Spleen: Subcentimeter low-attenuation lesions in the spleen or not definitely present previously and could represent developing splenic metastasis. Adrenals/Urinary Tract: Adrenal glands are unremarkable. Kidneys are normal, without renal calculi, focal lesion, or hydronephrosis. Bladder is unremarkable. Stomach/Bowel: Stomach, small bowel, and colon are not abnormally distended. Scattered gas and stool  throughout the colon. There is a circumferential mass in the hepatic flexure of the colon likely representing the patient's known colon neoplasm. The mass extends into the adjacent mesenteric with maximal measurement of 5.5 x 6.4 cm. Vascular/Lymphatic: Scattered calcifications in the abdominal aorta. No aneurysm. Inferior vena cava is flattened, possibly representing hypovolemia. Moderately enlarged lymph nodes are present throughout the retroperitoneum with multiple enlarged mesenteric lymph nodes, likely metastatic. No definite progression since previous study. Reproductive: Prostate is unremarkable. Other: Moderately large amount of ascites demonstrated throughout the abdomen and pelvis. Diffuse nodular infiltration of the mesenteric and omentum likely representing peritoneal carcinomatosis. Ascites is increased since previous study. No free air. Musculoskeletal: Degenerative changes in the spine. No destructive bone lesions. IMPRESSION: Circumferential mass in the hepatic flexure of the colon with adjacent mesenteric mass consistent with primary colon neoplasm. Diffuse peritoneal carcinomatosis with increasing ascites since previous study. Developing subcentimeter nodules in the liver and spleen worrisome for metastatic disease. Small bilateral pleural effusions with basilar atelectasis. Electronically Signed   By: Lucienne Capers M.D.   On: 10/24/2016 06:50   Dg Abd Acute W/chest  Result Date: 10/30/2016 CLINICAL DATA:  Constipation, colon cancer EXAM: DG ABDOMEN ACUTE W/ 1V CHEST COMPARISON:  10/24/2016 FINDINGS: Cardiomediastinal silhouette is unremarkable. No infiltrate or pulmonary edema. On there is right IJ Port-A-Cath with tip in upper SVC. Limited study by patient's large body habitus. Mild gaseous distended small bowel loops mid abdomen probable mild ileus. Abundant stool noted in right colon and transverse colon. No evidence of free abdominal air. Significant stool within rectum measures 9.2 cm  in diameter. IMPRESSION: 1. No acute disease within chest.  Right IJ Port-A-Cath in place. 2. Mild gaseous distended small bowel loops mid abdomen probable ileus. Abundant stool noted in right colon and transverse colon. Significant stool noted within rectum. The rectum measures 9.2 cm in diameter highly suspicious for fecal impaction. Electronically Signed   By: Lahoma Crocker M.D.   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  PLAN:    1. 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 Further testing of patient's pathology revealed intact and MMR proteins indicating low probability of MSI-H. Because of this, immunotherapy is not likely to be helpful. Patient has only taken approximately a week and a half of his most recent cycle of Regorafenib starting today. Patient currently is supposed to have treatment for 21 days with 7 day break. Given his difficulty with treatment, can consider decreasing to 14 days or possibly decreasing the dose of Regorafenib. He wishes to hold treatment at this time until he can have an MRI of his lumbar and sacral spine to  assess the numbness in his leg. Return to clinic after his MRI for discussion of results and consideration of reinitiating treatment.   2. Pain: Continue percocet as needed as prescribed. 3. Constipation: Improving, continue lactulose and OTC treatments as prescribed.   4. Difficulty sleeping: Ambien has been discontinued and patient was given a prescription for Xanax. 5. Anxiety: Xanax as needed 3 times a day. Patient has refused psychiatry referral for further management of his depression and anxiety. 6. Postop surgical care: Continue follow up with surgery as indicated.  7. Abdominal distention/pain/constipation:  Consider repeat paracentesis in the future. Patient's last paracentesis was in October 06, 2016. 8. Right leg pain and numbness: We will get MRI of the lumbosacral and lumbar spine to further evaluate. Patient will then follow-up 1-2 days later for further evaluation and consideration of reinitiating treatment.   Patient expressed understanding and was in agreement with this plan. He also understands that He can call clinic at any time with any questions, concerns, or complaints.    Lloyd Huger, MD 11/19/16 2:42 PM

## 2016-11-17 ENCOUNTER — Inpatient Hospital Stay (HOSPITAL_BASED_OUTPATIENT_CLINIC_OR_DEPARTMENT_OTHER): Payer: Commercial Managed Care - PPO | Admitting: Oncology

## 2016-11-17 ENCOUNTER — Inpatient Hospital Stay: Payer: Commercial Managed Care - PPO | Attending: Oncology

## 2016-11-17 VITALS — BP 143/83 | HR 106 | Temp 96.7°F | Wt 183.0 lb

## 2016-11-17 DIAGNOSIS — R188 Other ascites: Secondary | ICD-10-CM

## 2016-11-17 DIAGNOSIS — J90 Pleural effusion, not elsewhere classified: Secondary | ICD-10-CM

## 2016-11-17 DIAGNOSIS — C786 Secondary malignant neoplasm of retroperitoneum and peritoneum: Secondary | ICD-10-CM

## 2016-11-17 DIAGNOSIS — C772 Secondary and unspecified malignant neoplasm of intra-abdominal lymph nodes: Secondary | ICD-10-CM | POA: Insufficient documentation

## 2016-11-17 DIAGNOSIS — F17210 Nicotine dependence, cigarettes, uncomplicated: Secondary | ICD-10-CM | POA: Insufficient documentation

## 2016-11-17 DIAGNOSIS — R5383 Other fatigue: Secondary | ICD-10-CM

## 2016-11-17 DIAGNOSIS — F419 Anxiety disorder, unspecified: Secondary | ICD-10-CM

## 2016-11-17 DIAGNOSIS — K5900 Constipation, unspecified: Secondary | ICD-10-CM | POA: Diagnosis not present

## 2016-11-17 DIAGNOSIS — R200 Anesthesia of skin: Secondary | ICD-10-CM

## 2016-11-17 DIAGNOSIS — R0602 Shortness of breath: Secondary | ICD-10-CM

## 2016-11-17 DIAGNOSIS — C182 Malignant neoplasm of ascending colon: Secondary | ICD-10-CM

## 2016-11-17 DIAGNOSIS — R531 Weakness: Secondary | ICD-10-CM | POA: Diagnosis not present

## 2016-11-17 DIAGNOSIS — M5126 Other intervertebral disc displacement, lumbar region: Secondary | ICD-10-CM | POA: Diagnosis not present

## 2016-11-17 DIAGNOSIS — Z800 Family history of malignant neoplasm of digestive organs: Secondary | ICD-10-CM | POA: Diagnosis not present

## 2016-11-17 DIAGNOSIS — Z01812 Encounter for preprocedural laboratory examination: Secondary | ICD-10-CM

## 2016-11-17 DIAGNOSIS — M79604 Pain in right leg: Secondary | ICD-10-CM

## 2016-11-17 DIAGNOSIS — Z79899 Other long term (current) drug therapy: Secondary | ICD-10-CM | POA: Insufficient documentation

## 2016-11-17 DIAGNOSIS — R109 Unspecified abdominal pain: Secondary | ICD-10-CM | POA: Diagnosis not present

## 2016-11-17 DIAGNOSIS — C801 Malignant (primary) neoplasm, unspecified: Secondary | ICD-10-CM

## 2016-11-17 LAB — CBC WITH DIFFERENTIAL/PLATELET
BASOS ABS: 0 10*3/uL (ref 0–0.1)
BASOS PCT: 0 %
EOS ABS: 0.2 10*3/uL (ref 0–0.7)
EOS PCT: 2 %
HEMATOCRIT: 42 % (ref 40.0–52.0)
Hemoglobin: 14.4 g/dL (ref 13.0–18.0)
Lymphocytes Relative: 23 %
Lymphs Abs: 2.1 10*3/uL (ref 1.0–3.6)
MCH: 27.3 pg (ref 26.0–34.0)
MCHC: 34.2 g/dL (ref 32.0–36.0)
MCV: 79.9 fL — ABNORMAL LOW (ref 80.0–100.0)
MONO ABS: 0.7 10*3/uL (ref 0.2–1.0)
MONOS PCT: 8 %
NEUTROS ABS: 6 10*3/uL (ref 1.4–6.5)
Neutrophils Relative %: 67 %
PLATELETS: 339 10*3/uL (ref 150–440)
RBC: 5.26 MIL/uL (ref 4.40–5.90)
RDW: 16.6 % — AB (ref 11.5–14.5)
WBC: 9 10*3/uL (ref 3.8–10.6)

## 2016-11-17 LAB — COMPREHENSIVE METABOLIC PANEL
ALBUMIN: 3.5 g/dL (ref 3.5–5.0)
ALT: 48 U/L (ref 17–63)
ANION GAP: 9 (ref 5–15)
AST: 76 U/L — AB (ref 15–41)
Alkaline Phosphatase: 596 U/L — ABNORMAL HIGH (ref 38–126)
BILIRUBIN TOTAL: 0.7 mg/dL (ref 0.3–1.2)
BUN: 13 mg/dL (ref 6–20)
CHLORIDE: 101 mmol/L (ref 101–111)
CO2: 27 mmol/L (ref 22–32)
Calcium: 8.7 mg/dL — ABNORMAL LOW (ref 8.9–10.3)
Creatinine, Ser: 0.8 mg/dL (ref 0.61–1.24)
GFR calc Af Amer: >60 mL/min (ref >60)
GLUCOSE: 92 mg/dL (ref 65–99)
Potassium: 3.8 mmol/L (ref 3.5–5.1)
Sodium: 137 mmol/L (ref 135–145)
TOTAL PROTEIN: 7.8 g/dL (ref 6.5–8.1)

## 2016-11-17 NOTE — Progress Notes (Signed)
Patient states his right foot and leg feels like it is asleep.  States he is having pain in his abdomen and back.  Cannot get comfortable.  Having BM's about every 3rd day. Patient is tearful.  States something has to be done, he has to have some help. Accompanied by his family today.

## 2016-11-17 NOTE — Progress Notes (Signed)
I met with patient, his wife and his mother and father today at patient medical oncology appointment to further discuss research study NSABP (MPR-1) The NSABP Patient Registry and Nurse, mental health.  I previously met with patient and his family on 10/27/2016 and introduced study. I sent home copy of consent at that time for his review.  Patient states he has not reviewed the consent form and is in a lot of pain today. He has not ruled out the study, but prefers not to complete the consent process today due to not feeling well. He explains that he is having a lot of pain in his back, abdomen and feet.  I explained to him that deferring review of consent form today was perfectly fine.  He was agreeable for me to return at his next appointment (in approximately 3 weeks) to see what he has decided.

## 2016-11-21 ENCOUNTER — Ambulatory Visit (HOSPITAL_COMMUNITY)
Admission: RE | Admit: 2016-11-21 | Discharge: 2016-11-21 | Disposition: A | Discharge status: Home / Self Care | Payer: Commercial Managed Care - PPO | Source: Ambulatory Visit | Attending: Oncology | Admitting: Oncology

## 2016-11-21 ENCOUNTER — Encounter (HOSPITAL_COMMUNITY): Payer: Self-pay

## 2016-11-21 DIAGNOSIS — C786 Secondary malignant neoplasm of retroperitoneum and peritoneum: Secondary | ICD-10-CM

## 2016-11-21 DIAGNOSIS — C772 Secondary and unspecified malignant neoplasm of intra-abdominal lymph nodes: Principal | ICD-10-CM

## 2016-11-21 DIAGNOSIS — C801 Malignant (primary) neoplasm, unspecified: Secondary | ICD-10-CM

## 2016-11-21 DIAGNOSIS — C182 Malignant neoplasm of ascending colon: Secondary | ICD-10-CM

## 2016-11-23 NOTE — Progress Notes (Deleted)
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  INTERVAL HISTORY: Patient returns to clinic today for further evaluation and continuation of Regorafenib. He continues to be highly anxious. He continues to have problems with constipation and abdominal pain.  He denies any nausea, vomiting, or diarrhea.  He complains of weakness and fatigue today.  He complains of numbness in his pain particularly in his right leg below the knee. He has no other neurologic complaints. He denies any fevers or illnesses. He reports shortness of breath with exertion, but denies chest pain. He has no urinary complaints. Patient offers no further specific complaints today.  REVIEW OF SYSTEMS:   Review of Systems  Constitutional: Positive for malaise/fatigue. Negative for fever and weight loss.  HENT: Negative for congestion.   Respiratory: Positive for shortness of breath. Negative for cough.   Cardiovascular: Negative.  Negative for chest pain.  Gastrointestinal: Positive for abdominal pain and constipation. Negative for blood in stool, diarrhea, melena, nausea and vomiting.  Genitourinary: Negative.   Musculoskeletal: Negative.   Skin: Negative.   Neurological: Positive for weakness. Negative for tremors and headaches.  Psychiatric/Behavioral: Negative for depression. The patient is nervous/anxious.     As per HPI. Otherwise, a complete review of systems is negative.  PAST MEDICAL HISTORY: Past Medical History:  Diagnosis Date  . Cancer [facility]    colon cancer  . Headache    H/O  . Loosening of tooth    top front  . Shortness of breath dyspnea    on exertion    PAST SURGICAL HISTORY: Past Surgical History:  Procedure Laterality Date  . COLON RESECTION Right 07/16/2016   Procedure: COLON RESECTION;  Surgeon: [name], MD;  Location: [facility];  Service: General;  Laterality: Right;  . COLONOSCOPY WITH PROPOFOL N/A 03/06/2016   Procedure: COLONOSCOPY WITH PROPOFOL;  Surgeon: Lucilla Lame, MD;  Location: [place];  Service: Endoscopy;  Laterality: N/A;  . POLYPECTOMY  03/06/2016   Procedure: POLYPECTOMY;  Surgeon: Lucilla Lame, MD;  Location: [place];  Service: Endoscopy;;  . PORTACATH PLACEMENT Right 03/25/2016   Procedure: INSERTION PORT-A-CATH;  Surgeon: [name], MD;  Location: [facility];  Service: General;  Laterality: Right;  . Right foot surgery  2001    FAMILY HISTORY Family History  Problem Relation Age of Onset  . Pancreatic cancer Maternal Grandfather   . Lymphoma Maternal Grandmother   . CAD Father   . Heart disease Father        ADVANCED DIRECTIVES:    HEALTH MAINTENANCE: Social History  Substance Use Topics  . Smoking status: Current Every Day Smoker    Packs/day: 0.50    Years: 35.00    Types: Cigarettes  . Smokeless tobacco: Never Used     Comment: has more than 30 pack years history  . Alcohol use No     Colonoscopy:  PAP:  Bone density:  Lipid panel:  No Known Allergies  Current Outpatient Prescriptions  Medication Sig Dispense Refill  . ALPRAZolam (XANAX) 0.5 MG tablet Take 1 tablet (0.5 mg total) by mouth 3 (three) times daily as needed for anxiety. 45 tablet 0  .  Bisacodyl (DULCOLAX PO) Take by mouth.    Marland Kitchen ibuprofen (ADVIL,MOTRIN) 200 MG tablet Take 200-800 mg by mouth every 6 (six) hours as needed.    . lactulose (CHRONULAC) 10 GM/15ML solution Take 30 mLs (20 g total) by mouth daily as needed for mild constipation. 120 mL 0  . ondansetron (ZOFRAN) 8 MG tablet Take 1 tablet (8 mg total) by mouth 2 (two) times daily  as needed for refractory nausea / vomiting. (Patient not taking: Reported on 10/27/2016) 30 tablet 3  . oxyCODONE-acetaminophen (PERCOCET/ROXICET) 5-325 MG tablet Take 1 tablet by mouth every 4 (four) hours as needed for severe pain. 60 tablet 0  . polyethylene glycol (MIRALAX) packet Take 17 g by mouth daily. 14 each 0  . regorafenib (STIVARGA) 40 MG tablet Take 4 tablets (160 mg total) by mouth daily with breakfast. Take for 21 days then 7 days off. Take with low fat meal. Caution: Chemotherapy. (Patient not taking: Reported on 11/17/2016) 84 tablet 5   No current facility-administered medications for this visit.    Facility-Administered Medications Ordered in Other Visits  Medication Dose Route Frequency Provider Last Rate Last Dose  . 0.9 %  sodium chloride infusion   Intravenous Once Lloyd Huger, MD      . heparin lock flush 100 unit/mL  500 Units Intracatheter Once PRN Lloyd Huger, MD      . sodium chloride flush (NS) 0.9 % injection 10 mL  10 mL Intravenous PRN Lloyd Huger, MD   10 mL at 04/09/16 1014    OBJECTIVE: There were no vitals filed for this visit.   There is no height or weight on file to calculate BMI.    ECOG FS:1 - Symptomatic but completely ambulatory  General: Well-developed, well-nourished, no acute distress. Eyes: Pink conjunctiva, anicteric sclera. Lungs: Clear to auscultation bilaterally. Heart: Regular rate and rhythm. No rubs, murmurs, or gallops. Abdomen: Distended, firm, nontender. Well-healing surgical scar. Musculoskeletal: No edema, cyanosis, or clubbing. Neuro: Alert, answering all questions appropriately. Cranial nerves grossly intact. Skin: No rashes or petechiae noted. Psych: Normal affect.   LAB RESULTS:  Lab Results  Component Value Date   NA 137 11/17/2016   K 3.8 11/17/2016   CL 101 11/17/2016   CO2 27 11/17/2016   GLUCOSE 92 11/17/2016   BUN 13 11/17/2016   CREATININE 0.80 11/17/2016   CALCIUM 8.7 (L) 11/17/2016    PROT 7.8 11/17/2016   ALBUMIN 3.5 11/17/2016   AST 76 (H) 11/17/2016   ALT 48 11/17/2016   ALKPHOS 596 (H) 11/17/2016   BILITOT 0.7 11/17/2016   GFRNONAA >60 11/17/2016   GFRAA >60 11/17/2016    Lab Results  Component Value Date   WBC 9.0 11/17/2016   NEUTROABS 6.0 11/17/2016   HGB 14.4 11/17/2016   HCT 42.0 11/17/2016   MCV 79.9 (L) 11/17/2016   PLT 339 11/17/2016   Lab Results  Component Value Date   CEA 3.1 02/18/2016     STUDIES: Dg Abd Acute W/chest  Result Date: 10/30/2016 CLINICAL DATA:  Constipation, colon cancer EXAM: DG ABDOMEN ACUTE W/ 1V CHEST COMPARISON:  10/24/2016 FINDINGS: Cardiomediastinal silhouette is unremarkable. No infiltrate or pulmonary edema. On there is right IJ Port-A-Cath with tip in upper SVC. Limited study by patient's large body habitus. Mild gaseous distended small bowel loops mid abdomen probable mild ileus. Abundant stool noted in right colon and transverse colon. No evidence of free abdominal air. Significant stool within rectum measures 9.2 cm in diameter. IMPRESSION:  1. No acute disease within chest.  Right IJ Port-A-Cath in place. 2. Mild gaseous distended small bowel loops mid abdomen probable ileus. Abundant stool noted in right colon and transverse colon. Significant stool noted within rectum. The rectum measures 9.2 cm in diameter highly suspicious for fecal impaction. Electronically Signed   By: Lahoma Crocker M.D.   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  PLAN:    1. 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 Further testing of patient's pathology revealed intact and MMR proteins indicating low probability of MSI-H. Because of this, immunotherapy is not likely to be helpful. Patient has only  taken approximately a week and a half of his most recent cycle of Regorafenib starting today. Patient currently is supposed to have treatment for 21 days with 7 day break. Given his difficulty with treatment, can consider decreasing to 14 days or possibly decreasing the dose of Regorafenib. He wishes to hold treatment at this time until he can have an MRI of his lumbar and sacral spine to assess the numbness in his leg. Return to clinic after his MRI for discussion of results and consideration of reinitiating treatment.   2. Pain: Continue percocet as needed as prescribed. 3. Constipation: Improving, continue lactulose and OTC treatments as prescribed.   4. Difficulty sleeping: Ambien has been discontinued and patient was given a prescription for Xanax. 5. Anxiety: Xanax as needed 3 times a day. Patient has refused psychiatry referral for further management of his depression and anxiety. 6. Postop surgical care: Continue follow up with surgery as indicated.  7. Abdominal distention/pain/constipation: Consider repeat paracentesis in the future. Patient's last paracentesis was in October 06, 2016. 8. Right leg pain and numbness: We will get MRI of the lumbosacral and lumbar spine to further evaluate. Patient will then follow-up 1-2 days later for further evaluation and consideration of reinitiating treatment.   Patient expressed understanding and was in agreement with this plan. He also understands that He can call clinic at any time with any questions, concerns, or complaints.    Lloyd Huger, MD 11/23/16 11:04 PM

## 2016-11-24 ENCOUNTER — Inpatient Hospital Stay: Payer: Commercial Managed Care - PPO | Admitting: Oncology

## 2016-11-24 ENCOUNTER — Telehealth: Payer: Self-pay | Admitting: *Deleted

## 2016-11-24 ENCOUNTER — Other Ambulatory Visit: Payer: Self-pay | Admitting: *Deleted

## 2016-11-24 DIAGNOSIS — C786 Secondary malignant neoplasm of retroperitoneum and peritoneum: Secondary | ICD-10-CM

## 2016-11-24 DIAGNOSIS — C801 Malignant (primary) neoplasm, unspecified: Principal | ICD-10-CM

## 2016-11-24 NOTE — Telephone Encounter (Signed)
Called pt to question if can go ahead and reschedule pt for MRI. Pt stated to reschedule MRI and would like to be scheduled in  [place]. Pt stated was clastrophobic and could not sit in MRI. Instructed pt to take 2 tablets xanax 1 hr prior to MRI. Pt verbalized understanding. Scheduling notified to reschedule appt and call pt with appts.

## 2016-11-27 ENCOUNTER — Ambulatory Visit
Admission: RE | Admit: 2016-11-27 | Discharge: 2016-11-27 | Disposition: A | Discharge status: Home / Self Care | Payer: Commercial Managed Care - PPO | Source: Ambulatory Visit | Attending: Oncology | Admitting: Oncology

## 2016-11-27 DIAGNOSIS — M47817 Spondylosis without myelopathy or radiculopathy, lumbosacral region: Secondary | ICD-10-CM | POA: Insufficient documentation

## 2016-11-27 DIAGNOSIS — C801 Malignant (primary) neoplasm, unspecified: Secondary | ICD-10-CM | POA: Insufficient documentation

## 2016-11-27 DIAGNOSIS — C786 Secondary malignant neoplasm of retroperitoneum and peritoneum: Secondary | ICD-10-CM | POA: Diagnosis not present

## 2016-11-27 DIAGNOSIS — R938 Abnormal findings on diagnostic imaging of other specified body structures: Secondary | ICD-10-CM | POA: Diagnosis not present

## 2016-11-27 DIAGNOSIS — M4807 Spinal stenosis, lumbosacral region: Secondary | ICD-10-CM | POA: Insufficient documentation

## 2016-11-27 DIAGNOSIS — R609 Edema, unspecified: Secondary | ICD-10-CM | POA: Diagnosis not present

## 2016-11-27 MED ORDER — GADOBENATE DIMEGLUMINE 529 MG/ML IV SOLN
15.0000 mL | Freq: Once | INTRAVENOUS | Status: AC | PRN
  Administered 2016-11-27: 15 mL via INTRAVENOUS

## 2016-11-28 ENCOUNTER — Ambulatory Visit: Payer: Commercial Managed Care - PPO

## 2016-11-30 DIAGNOSIS — Z7189 Other specified counseling: Secondary | ICD-10-CM | POA: Insufficient documentation

## 2016-11-30 NOTE — Progress Notes (Signed)
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  INTERVAL HISTORY: Patient returns to clinic today for further evaluation and discussion of his imaging results. He continues to have significant back and leg pain. He continues to be highly anxious. He continues to have problems with constipation and abdominal pain.  He denies any nausea, vomiting, or diarrhea.  He complains of weakness and fatigue today. He has no other neurologic complaints. He denies any fevers or illnesses. He reports shortness of breath with exertion, but denies chest pain. He has no urinary complaints. Patient offers no further specific complaints today.  REVIEW OF SYSTEMS:   Review of Systems  Constitutional: Positive for malaise/fatigue. Negative for fever and weight loss.  HENT: Negative for congestion.   Respiratory: Positive for shortness of breath. Negative for cough.   Cardiovascular: Negative.  Negative for chest pain.  Gastrointestinal: Positive for abdominal pain and constipation. Negative for blood in stool, diarrhea, melena, nausea and vomiting.  Genitourinary: Negative.   Musculoskeletal: Negative.   Skin: Negative.   Neurological: Positive for weakness. Negative for tremors and headaches.  Psychiatric/Behavioral: Negative for depression. The patient is nervous/anxious.     As per HPI. Otherwise, a complete review of systems is negative.  PAST MEDICAL HISTORY: Past Medical History:  Diagnosis Date  . Cancer [facility])    colon cancer  . Headache    H/O  . Loosening of tooth    top front  . Shortness of  breath dyspnea    on exertion    PAST SURGICAL HISTORY: Past Surgical History:  Procedure Laterality Date  . COLON RESECTION Right 07/16/2016   Procedure: COLON RESECTION;  Surgeon: [name], MD;  Location: [facility];  Service: General;  Laterality: Right;  . COLONOSCOPY WITH PROPOFOL N/A 03/06/2016   Procedure: COLONOSCOPY WITH PROPOFOL;  Surgeon: Lucilla Lame, MD;  Location: [place];  Service: Endoscopy;  Laterality: N/A;  . POLYPECTOMY  03/06/2016   Procedure: POLYPECTOMY;  Surgeon: Lucilla Lame, MD;  Location: [place];  Service: Endoscopy;;  . PORTACATH PLACEMENT Right 03/25/2016   Procedure: INSERTION PORT-A-CATH;  Surgeon: [name], MD;  Location: [facility];  Service: General;  Laterality: Right;  . Right foot surgery  2001    FAMILY HISTORY Family History  Problem Relation Age of Onset  . Pancreatic cancer Maternal Grandfather   . Lymphoma Maternal Grandmother   . CAD Father   . Heart disease Father        ADVANCED DIRECTIVES:    HEALTH MAINTENANCE: Social History  Substance Use Topics  . Smoking status: Current Every Day Smoker    Packs/day: 0.50    Years: 35.00    Types: Cigarettes  . Smokeless tobacco: Never Used     Comment: has more than 30 pack years history  . Alcohol use No     Colonoscopy:  PAP:  Bone density:  Lipid panel:  No Known Allergies  Current Outpatient Prescriptions  Medication Sig Dispense Refill  . ALPRAZolam (XANAX) 0.5 MG tablet Take 1 tablet (0.5 mg total) by mouth 3 (three) times daily as needed for anxiety. 45 tablet 0  . Bisacodyl (DULCOLAX PO)  Take 1 tablet by mouth daily.     Marland Kitchen ibuprofen (ADVIL,MOTRIN) 200 MG tablet Take 200-800 mg by mouth every 6 (six) hours as needed.    . lactulose (CHRONULAC) 10 GM/15ML solution Take 30 mLs (20 g total) by mouth daily as needed for mild constipation. 120 mL 0  . ondansetron (ZOFRAN) 8 MG tablet Take 1 tablet (8 mg total) by mouth 2 (two) times daily as needed  for refractory nausea / vomiting. 30 tablet 3  . oxyCODONE-acetaminophen (PERCOCET/ROXICET) 5-325 MG tablet Take 1 tablet by mouth every 4 (four) hours as needed for severe pain. 60 tablet 0  . polyethylene glycol (MIRALAX) packet Take 17 g by mouth daily. 14 each 0  . regorafenib (STIVARGA) 40 MG tablet Take 3 tablets (120 mg total) by mouth daily with breakfast. Take for 21 days then 7 days off. Take with low fat meal. Caution: Chemotherapy. 63 tablet 5   No current facility-administered medications for this visit.    Facility-Administered Medications Ordered in Other Visits  Medication Dose Route Frequency Provider Last Rate Last Dose  . 0.9 %  sodium chloride infusion   Intravenous Once Lloyd Huger, MD      . heparin lock flush 100 unit/mL  500 Units Intracatheter Once PRN Lloyd Huger, MD      . sodium chloride flush (NS) 0.9 % injection 10 mL  10 mL Intravenous PRN Lloyd Huger, MD   10 mL at 04/09/16 1014    OBJECTIVE: Vitals:   12/01/16 1125  BP: (!) 143/97  Pulse: (!) 111  Resp: 18  Temp: (!) 96.7 F (35.9 C)     Body mass index is 27.01 kg/m.    ECOG FS:1 - Symptomatic but completely ambulatory  General: Well-developed, well-nourished, no acute distress. Eyes: Pink conjunctiva, anicteric sclera. Lungs: Clear to auscultation bilaterally. Heart: Regular rate and rhythm. No rubs, murmurs, or gallops. Abdomen: Distended, firm, nontender. Well-healing surgical scar. Musculoskeletal: No edema, cyanosis, or clubbing. Neuro: Alert, answering all questions appropriately. Cranial nerves grossly intact. Skin: No rashes or petechiae noted. Psych: Normal affect.   LAB RESULTS:  Lab Results  Component Value Date   NA 137 11/17/2016   K 3.8 11/17/2016   CL 101 11/17/2016   CO2 27 11/17/2016   GLUCOSE 92 11/17/2016   BUN 13 11/17/2016   CREATININE 0.80 11/17/2016   CALCIUM 8.7 (L) 11/17/2016   PROT 7.8 11/17/2016   ALBUMIN 3.5 11/17/2016   AST 76 (H)  11/17/2016   ALT 48 11/17/2016   ALKPHOS 596 (H) 11/17/2016   BILITOT 0.7 11/17/2016   GFRNONAA >60 11/17/2016   GFRAA >60 11/17/2016    Lab Results  Component Value Date   WBC 9.0 11/17/2016   NEUTROABS 6.0 11/17/2016   HGB 14.4 11/17/2016   HCT 42.0 11/17/2016   MCV 79.9 (L) 11/17/2016   PLT 339 11/17/2016   Lab Results  Component Value Date   CEA 3.1 02/18/2016     STUDIES: Mr Lumbar Spine W Wo Contrast  Result Date: 11/27/2016 CLINICAL DATA:  Initial evaluation for low back pain with right leg pain extending into foot for 6 months. History of stage IV colon cancer. EXAM: MRI LUMBAR SPINE WITHOUT AND WITH CONTRAST TECHNIQUE: Multiplanar and multiecho pulse sequences of the lumbar spine were obtained without and with intravenous contrast. CONTRAST:  35m MULTIHANCE GADOBENATE DIMEGLUMINE 529 MG/ML IV SOLN COMPARISON:  Comparison made with prior CT from 10/24/2016. FINDINGS: Segmentation: Transitional lumbosacral anatomy with lumbarized S1  segment. Alignment: Vertebral bodies normally aligned with preservation of the normal lumbar lordosis. No listhesis. Vertebrae: Vertebral body heights are maintained. No evidence for acute or chronic fracture. Minimal gait STIR signal intensity within the anterior aspect of the inferior endplate of L2 favored to be related to reactive endplate changes. No definite discrete osseous lesions. Conus medullaris: Extends to the L1 level and appears normal. There is fairly diffuse enhancement involving the ventral epidural space, essentially extending from the L3-4 level through the sacrum, best appreciated on sagittal post gadolinium sequence (series 8, image 8). Cephalad extension within the ventral epidural space into the visualized lower thoracic spinal canal, with additional abnormal enhancement within the dorsal epidural space at the level of the thoracolumbar junction (series 8, image 9). This enhancement demonstrates T2 hypointensity, and has the  appearance of vascular flow voids on axial sequences. 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 Possible metastatic disease is felt to be less likely, although not entirely excluded, as superimposed metastatic lesions would be extremely difficult if not impossible to exclude. Paraspinal and other soft tissues: T2/STIR signal intensity within the paraspinous musculature of the lower back, greater on the right (series 5, image 1). No associated enhancement. Finding is nonspecific, but may reflect sequelae of muscular strain/injury and/or denervation. Disc levels: T11-12: Mild diffuse disc bulge with disc desiccation. No stenosis. T12-L1:  Unremarkable. L1-2: Mild diffuse disc bulge with disc desiccation and intervertebral disc space narrowing. No stenosis. L2-3: Disc desiccation without significant disc bulge. No significant stenosis. L3-4: Mild diffuse disc bulge with disc desiccation. No significant stenosis. L4-5: Normal interspace. Mild bilateral facet arthrosis. No significant stenosis. L5-S1: Minimal disc bulge with disc desiccation. Moderate bilateral facet arthrosis, slightly greater on the right. No significant canal or lateral recess stenosis. Mild to moderate bilateral foraminal stenosis. IMPRESSION: 1. Diffuse enhancement involving the epidural space of the lumbar spine as above, favored to reflect engorged venous plexus given appearance, possibly related to venous outflow obstruction due to patient's known peritoneal disease. While metastatic disease is not entirely excluded, this is felt to be less likely (see above discussion). A short interval follow-up study in 3 months is recommended to document stability. 2. Mild edema within the paraspinous musculature of the lower back, greater on the right, suggesting muscular strain/injury and/or  denervation. 3. Bilateral facet arthrosis at L5-S1 with resultant mild to moderate bilateral foraminal stenosis. 4. Additional fairly minimal for age multilevel degenerative changes as above. No other significant stenosis or evidence for neural impingement. Electronically Signed   By: Jeannine Boga M.D.   On: 11/27/2016 13:44   US Paracentesis  Result Date: 12/04/2016 INDICATION: History of colon cancer with peritoneal carcinomatosis and recurrent symptomatic ascites. EXAM: ULTRASOUND-GUIDED PARACENTESIS COMPARISON:  Ultrasound-guided paracentesis - 10/06/2016 MEDICATIONS: None. COMPLICATIONS: None immediate. TECHNIQUE: Informed written consent was obtained from the patient after a discussion of the risks, benefits and alternatives to treatment. A timeout was performed prior to the initiation of the procedure. Initial ultrasound scanning demonstrates a moderate to large amount of ascites within the right lower abdominal quadrant. The right lower abdomen was prepped and draped in the usual sterile fashion. 1% lidocaine with epinephrine was used for local anesthesia. An ultrasound image was saved for documentation purposed. An 8 Fr Safe-T-Centesis catheter was introduced. The paracentesis was performed. The catheter was removed and a dressing was applied. The patient tolerated the procedure well without immediate  post procedural complication. FINDINGS: A total of approximately 7.5 liters of dark yellow, serous fluid was removed. IMPRESSION: Successful ultrasound-guided paracentesis yielding 7.5 liters of peritoneal fluid. Electronically Signed   By: Sandi Mariscal M.D.   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  PLAN:    1. 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 Further testing of patient's pathology revealed intact and MMR proteins indicating low probability of MSI-H. Because of this, immunotherapy is not likely to be helpful. Patient has not taken Regorafenib in approximately one month.  Patient currently is supposed to have treatment for 21 days with 7 day break. Patient plans to restart treatment with dose reduced Regorafenib 360 mg. Return to clinic in 2 weeks for repeat laboratory work and further evaluation.  2. Pain: Continue percocet as needed as prescribed. 3. Constipation: Improving, continue lactulose and OTC treatments as prescribed.   4. Difficulty sleeping: Ambien has been discontinued and patient was given a prescription for Xanax. 5. Anxiety: Xanax as needed 3 times a day. Patient has refused psychiatry referral for further management of his depression and anxiety. 6. Postop surgical care: Continue follow up with surgery as indicated.  7. Abdominal distention/pain/constipation: Repeat paracentesis in the next 1-2 days. Patient's last paracentesis was in October 06, 2016. 8. Right leg pain and numbness: MRI results as above with no distinct metastatic lesion, but venous congestion likely secondary to malignancy. 9. Weakness/falls: We will request home health services for further evaluation.  Patient expressed understanding and was in agreement with this plan. He also understands that He can call clinic at any time with any questions, concerns, or complaints.    Lloyd Huger, MD 12/07/16 8:01 AM

## 2016-12-01 ENCOUNTER — Inpatient Hospital Stay (HOSPITAL_BASED_OUTPATIENT_CLINIC_OR_DEPARTMENT_OTHER): Payer: Commercial Managed Care - PPO | Admitting: Oncology

## 2016-12-01 VITALS — BP 143/97 | HR 111 | Temp 96.7°F | Resp 18 | Wt 182.9 lb

## 2016-12-01 DIAGNOSIS — M5126 Other intervertebral disc displacement, lumbar region: Secondary | ICD-10-CM

## 2016-12-01 DIAGNOSIS — R109 Unspecified abdominal pain: Secondary | ICD-10-CM

## 2016-12-01 DIAGNOSIS — F17210 Nicotine dependence, cigarettes, uncomplicated: Secondary | ICD-10-CM

## 2016-12-01 DIAGNOSIS — C182 Malignant neoplasm of ascending colon: Secondary | ICD-10-CM

## 2016-12-01 DIAGNOSIS — R531 Weakness: Secondary | ICD-10-CM

## 2016-12-01 DIAGNOSIS — R200 Anesthesia of skin: Secondary | ICD-10-CM

## 2016-12-01 DIAGNOSIS — C772 Secondary and unspecified malignant neoplasm of intra-abdominal lymph nodes: Secondary | ICD-10-CM | POA: Diagnosis not present

## 2016-12-01 DIAGNOSIS — R5383 Other fatigue: Secondary | ICD-10-CM | POA: Diagnosis not present

## 2016-12-01 DIAGNOSIS — J90 Pleural effusion, not elsewhere classified: Secondary | ICD-10-CM

## 2016-12-01 DIAGNOSIS — K5900 Constipation, unspecified: Secondary | ICD-10-CM

## 2016-12-01 DIAGNOSIS — Z800 Family history of malignant neoplasm of digestive organs: Secondary | ICD-10-CM

## 2016-12-01 DIAGNOSIS — F419 Anxiety disorder, unspecified: Secondary | ICD-10-CM | POA: Diagnosis not present

## 2016-12-01 DIAGNOSIS — M79604 Pain in right leg: Secondary | ICD-10-CM

## 2016-12-01 DIAGNOSIS — C786 Secondary malignant neoplasm of retroperitoneum and peritoneum: Secondary | ICD-10-CM

## 2016-12-01 DIAGNOSIS — R0602 Shortness of breath: Secondary | ICD-10-CM

## 2016-12-01 DIAGNOSIS — C801 Malignant (primary) neoplasm, unspecified: Secondary | ICD-10-CM

## 2016-12-01 DIAGNOSIS — Z7189 Other specified counseling: Secondary | ICD-10-CM

## 2016-12-01 DIAGNOSIS — Z79899 Other long term (current) drug therapy: Secondary | ICD-10-CM

## 2016-12-01 DIAGNOSIS — R188 Other ascites: Secondary | ICD-10-CM | POA: Diagnosis not present

## 2016-12-01 MED ORDER — REGORAFENIB 40 MG PO TABS: 120.0000 mg | ORAL_TABLET | Freq: Every day | ORAL | 5 refills | Status: DC

## 2016-12-01 MED ORDER — ALPRAZOLAM 0.5 MG PO TABS: 0.5000 mg | ORAL_TABLET | Freq: Three times a day (TID) | ORAL | 0 refills | Status: DC | PRN

## 2016-12-01 MED ORDER — OXYCODONE-ACETAMINOPHEN 5-325 MG PO TABS: 1.0000 | ORAL_TABLET | ORAL | 0 refills | Status: DC | PRN

## 2016-12-01 NOTE — Progress Notes (Signed)
Complains of fluid accumulation in abdomen. Pt requests for another paracentesis. Has had multiple falls at home due to right left weakness. Pt requests referral to home health for physical therapy.

## 2016-12-02 NOTE — Discharge Instructions (Signed)
Paracentesis, Care After  Refer to this sheet in the next few weeks. These instructions provide you with information about caring for yourself after your procedure. Your health care provider may also give you more specific instructions. Your treatment has been planned according to current medical practices, but problems sometimes occur. Call your health care provider if you have any problems or questions after your procedure.  What can I expect after the procedure?  After your procedure, it is common to have a small amount of clear fluid coming from the puncture site.  Follow these instructions at home:  · Return to your normal activities as told by your health care provider. Ask your health care provider what activities are safe for you.  · Take over-the-counter and prescription medicines only as told by your health care provider.  · Do not take baths, swim, or use a hot tub until your health care provider approves.  · Follow instructions from your health care provider about:  ? How to take care of your puncture site.  ? When and how you should change your bandage (dressing).  ? When you should remove your dressing.  · Check your puncture area every day signs of infection. Watch for:  ? Redness, swelling, or pain.  ? Fluid, blood, or pus.  · Keep all follow-up visits as told by your health care provider. This is important.  Contact a health care provider if:  · You have redness, swelling, or pain at your puncture site.  · You start to have more clear fluid coming from your puncture site.  · You have blood or pus coming from your puncture site.  · You have chills.  · You have a fever.  Get help right away if:  · You develop chest pain or shortness of breath.  · You develop increasing pain, discomfort, or swelling in your abdomen.  · You feel dizzy or light-headed or you pass out.  This information is not intended to replace advice given to you by your health care provider. Make sure you discuss any questions you  have with your health care provider.  Document Released: 01/16/2015 Document Revised: 02/07/2016 Document Reviewed: 11/14/2014  Elsevier Interactive Patient Education © 2017 [employer]

## 2016-12-04 ENCOUNTER — Ambulatory Visit
Admission: RE | Admit: 2016-12-04 | Discharge: 2016-12-04 | Disposition: A | Discharge status: Home / Self Care | Payer: Commercial Managed Care - PPO | Source: Ambulatory Visit | Attending: Oncology | Admitting: Oncology

## 2016-12-04 DIAGNOSIS — C182 Malignant neoplasm of ascending colon: Secondary | ICD-10-CM | POA: Insufficient documentation

## 2016-12-04 DIAGNOSIS — C772 Secondary and unspecified malignant neoplasm of intra-abdominal lymph nodes: Secondary | ICD-10-CM | POA: Insufficient documentation

## 2016-12-04 DIAGNOSIS — R188 Other ascites: Secondary | ICD-10-CM | POA: Diagnosis not present

## 2016-12-04 NOTE — Procedures (Signed)
Pre Procedural Dx: History of colon cancer with recurrent symptomatic Ascites Post Procedural Dx: Same  Successful US guided paracentesis yielding 7.5 L of serous ascitic fluid.  EBL: None  Complications: None immediate  Ronny Bacon, MD Pager #: [phone]

## 2016-12-14 NOTE — Progress Notes (Signed)
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  INTERVAL HISTORY: Patient returns to clinic today for further evaluation and discussion of his imaging results. His pain and constipation have significantly improved since his recent paracentesis He continues to be highly anxious. He denies any nausea, vomiting, or diarrhea.  He continues to have chronic weakness and fatigue. He has no other neurologic complaints. He denies any fevers or illnesses. He does not complain of shortness of breath, cough, or chest pain today. He has no urinary complaints. Patient offers no further specific complaints today.  REVIEW OF SYSTEMS:   Review of Systems  Constitutional: Positive for malaise/fatigue. Negative for fever and weight loss.  HENT: Negative for congestion.   Respiratory: Negative for cough and shortness of breath.   Cardiovascular: Negative.  Negative for chest pain.  Gastrointestinal: Positive for constipation. Negative for abdominal pain, blood in stool, diarrhea, melena, nausea and vomiting.  Genitourinary: Negative.   Musculoskeletal: Positive for back pain.  Skin: Negative.   Neurological: Positive for weakness. Negative for tremors and headaches.  Psychiatric/Behavioral: Negative for depression. The patient is nervous/anxious.     As per HPI. Otherwise, a complete review of systems is negative.  PAST MEDICAL HISTORY: Past Medical History:  Diagnosis Date  . Cancer [facility])    colon cancer  . Headache    H/O  . Loosening of tooth    top front  . Shortness of breath dyspnea    on  exertion    PAST SURGICAL HISTORY: Past Surgical History:  Procedure Laterality Date  . COLON RESECTION Right 07/16/2016   Procedure: COLON RESECTION;  Surgeon: [name], MD;  Location: [facility];  Service: General;  Laterality: Right;  . COLONOSCOPY WITH PROPOFOL N/A 03/06/2016   Procedure: COLONOSCOPY WITH PROPOFOL;  Surgeon: Lucilla Lame, MD;  Location: [place];  Service: Endoscopy;  Laterality: N/A;  . POLYPECTOMY  03/06/2016   Procedure: POLYPECTOMY;  Surgeon: Lucilla Lame, MD;  Location: [place];  Service: Endoscopy;;  . PORTACATH PLACEMENT Right 03/25/2016   Procedure: INSERTION PORT-A-CATH;  Surgeon: [name], MD;  Location: [facility];  Service: General;  Laterality: Right;  . Right foot surgery  2001    FAMILY HISTORY Family History  Problem Relation Age of Onset  . Pancreatic cancer Maternal Grandfather   . Lymphoma Maternal Grandmother   . CAD Father   . Heart disease Father        ADVANCED DIRECTIVES:    HEALTH MAINTENANCE: Social History  Substance Use Topics  . Smoking status: Current Every Day Smoker    Packs/day: 0.50    Years: 35.00    Types: Cigarettes  . Smokeless tobacco: Never Used     Comment: has more than 30 pack years history  . Alcohol use No     Colonoscopy:  PAP:  Bone density:  Lipid panel:  No Known Allergies  Current Outpatient Prescriptions  Medication Sig Dispense Refill  . Bisacodyl (DULCOLAX PO) Take 1 tablet by mouth daily.     Marland Kitchen ibuprofen (ADVIL,MOTRIN) 200 MG tablet Take 200-800 mg by mouth every 6 (six) hours as needed.    . lactulose (  CHRONULAC) 10 GM/15ML solution Take 30 mLs (20 g total) by mouth daily as needed for mild constipation. 120 mL 0  . ondansetron (ZOFRAN) 8 MG tablet Take 1 tablet (8 mg total) by mouth 2 (two) times daily as needed for refractory nausea / vomiting. 30 tablet 3  . polyethylene glycol (MIRALAX) packet Take 17 g by mouth daily. 14 each 0  . regorafenib (STIVARGA) 40 MG  tablet Take 3 tablets (120 mg total) by mouth daily with breakfast. Take for 21 days then 7 days off. Take with low fat meal. Caution: Chemotherapy. 63 tablet 5  . ALPRAZolam (XANAX) 0.5 MG tablet Take 1 tablet (0.5 mg total) by mouth 3 (three) times daily as needed for anxiety. 45 tablet 0  . oxyCODONE-acetaminophen (PERCOCET/ROXICET) 5-325 MG tablet Take 1 tablet by mouth every 4 (four) hours as needed for severe pain. 60 tablet 0   No current facility-administered medications for this visit.    Facility-Administered Medications Ordered in Other Visits  Medication Dose Route Frequency Provider Last Rate Last Dose  . 0.9 %  sodium chloride infusion   Intravenous Once Lloyd Huger, MD      . heparin lock flush 100 unit/mL  500 Units Intracatheter Once PRN Lloyd Huger, MD      . sodium chloride flush (NS) 0.9 % injection 10 mL  10 mL Intravenous PRN Lloyd Huger, MD   10 mL at 04/09/16 1014    OBJECTIVE: Vitals:   12/15/16 1202  BP: (!) 150/102  Pulse: (!) 110  Resp: 18  Temp: (!) 96.8 F (36 C)     Body mass index is 24.54 kg/m.    ECOG FS:1 - Symptomatic but completely ambulatory  General: Well-developed, well-nourished, no acute distress. Eyes: Pink conjunctiva, anicteric sclera. Lungs: Clear to auscultation bilaterally. Heart: Regular rate and rhythm. No rubs, murmurs, or gallops. Abdomen: Distended, firm, nontender. Well-healing surgical scar. Musculoskeletal: No edema, cyanosis, or clubbing. Neuro: Alert, answering all questions appropriately. Cranial nerves grossly intact. Skin: No rashes or petechiae noted. Psych: Normal affect.   LAB RESULTS:  Lab Results  Component Value Date   NA 134 (L) 12/15/2016   K 4.5 12/15/2016   CL 99 (L) 12/15/2016   CO2 27 12/15/2016   GLUCOSE 117 (H) 12/15/2016   BUN 19 12/15/2016   CREATININE 0.91 12/15/2016   CALCIUM 8.3 (L) 12/15/2016   PROT 6.9 12/15/2016   ALBUMIN 2.7 (L) 12/15/2016   AST 147 (H) 12/15/2016     ALT 130 (H) 12/15/2016   ALKPHOS 1,198 (H) 12/15/2016   BILITOT 1.9 (H) 12/15/2016   GFRNONAA >60 12/15/2016   GFRAA >60 12/15/2016    Lab Results  Component Value Date   WBC 8.6 12/15/2016   NEUTROABS 5.6 12/15/2016   HGB 15.2 12/15/2016   HCT 44.3 12/15/2016   MCV 81.7 12/15/2016   PLT 325 12/15/2016   Lab Results  Component Value Date   CEA 3.1 02/18/2016     STUDIES: Mr Lumbar Spine W Wo Contrast  Result Date: 11/27/2016 CLINICAL DATA:  Initial evaluation for low back pain with right leg pain extending into foot for 6 months. History of stage IV colon cancer. EXAM: MRI LUMBAR SPINE WITHOUT AND WITH CONTRAST TECHNIQUE: Multiplanar and multiecho pulse sequences of the lumbar spine were obtained without and with intravenous contrast. CONTRAST:  19m MULTIHANCE GADOBENATE DIMEGLUMINE 529 MG/ML IV SOLN COMPARISON:  Comparison made with prior CT from 10/24/2016. FINDINGS: Segmentation: Transitional lumbosacral anatomy with lumbarized S1  segment. Alignment: Vertebral bodies normally aligned with preservation of the normal lumbar lordosis. No listhesis. Vertebrae: Vertebral body heights are maintained. No evidence for acute or chronic fracture. Minimal gait STIR signal intensity within the anterior aspect of the inferior endplate of L2 favored to be related to reactive endplate changes. No definite discrete osseous lesions. Conus medullaris: Extends to the L1 level and appears normal. There is fairly diffuse enhancement involving the ventral epidural space, essentially extending from the L3-4 level through the sacrum, best appreciated on sagittal post gadolinium sequence (series 8, image 8). Cephalad extension within the ventral epidural space into the visualized lower thoracic spinal canal, with additional abnormal enhancement within the dorsal epidural space at the level of the thoracolumbar junction (series 8, image 9). This enhancement demonstrates T2 hypointensity, and has the appearance  of vascular flow voids on axial sequences. 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 Possible metastatic disease is felt to be less likely, although not entirely excluded, as superimposed metastatic lesions would be extremely difficult if not impossible to exclude. Paraspinal and other soft tissues: T2/STIR signal intensity within the paraspinous musculature of the lower back, greater on the right (series 5, image 1). No associated enhancement. Finding is nonspecific, but may reflect sequelae of muscular strain/injury and/or denervation. Disc levels: T11-12: Mild diffuse disc bulge with disc desiccation. No stenosis. T12-L1:  Unremarkable. L1-2: Mild diffuse disc bulge with disc desiccation and intervertebral disc space narrowing. No stenosis. L2-3: Disc desiccation without significant disc bulge. No significant stenosis. L3-4: Mild diffuse disc bulge with disc desiccation. No significant stenosis. L4-5: Normal interspace. Mild bilateral facet arthrosis. No significant stenosis. L5-S1: Minimal disc bulge with disc desiccation. Moderate bilateral facet arthrosis, slightly greater on the right. No significant canal or lateral recess stenosis. Mild to moderate bilateral foraminal stenosis. IMPRESSION: 1. Diffuse enhancement involving the epidural space of the lumbar spine as above, favored to reflect engorged venous plexus given appearance, possibly related to venous outflow obstruction due to patient's known peritoneal disease. While metastatic disease is not entirely excluded, this is felt to be less likely (see above discussion). A short interval follow-up study in 3 months is recommended to document stability. 2. Mild edema within the paraspinous musculature of the lower back, greater on the right, suggesting muscular strain/injury and/or denervation. 3.  Bilateral facet arthrosis at L5-S1 with resultant mild to moderate bilateral foraminal stenosis. 4. Additional fairly minimal for age multilevel degenerative changes as above. No other significant stenosis or evidence for neural impingement. Electronically Signed   By: Jeannine Boga M.D.   On: 11/27/2016 13:44   US Paracentesis  Result Date: 12/04/2016 INDICATION: History of colon cancer with peritoneal carcinomatosis and recurrent symptomatic ascites. EXAM: ULTRASOUND-GUIDED PARACENTESIS COMPARISON:  Ultrasound-guided paracentesis - 10/06/2016 MEDICATIONS: None. COMPLICATIONS: None immediate. TECHNIQUE: Informed written consent was obtained from the patient after a discussion of the risks, benefits and alternatives to treatment. A timeout was performed prior to the initiation of the procedure. Initial ultrasound scanning demonstrates a moderate to large amount of ascites within the right lower abdominal quadrant. The right lower abdomen was prepped and draped in the usual sterile fashion. 1% lidocaine with epinephrine was used for local anesthesia. An ultrasound image was saved for documentation purposed. An 8 Fr Safe-T-Centesis catheter was introduced. The paracentesis was performed. The catheter was removed and a dressing was applied. The patient tolerated the procedure well without immediate  post procedural complication. FINDINGS: A total of approximately 7.5 liters of dark yellow, serous fluid was removed. IMPRESSION: Successful ultrasound-guided paracentesis yielding 7.5 liters of peritoneal fluid. Electronically Signed   By: Sandi Mariscal M.D.   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  PLAN:    1. 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 Further  testing of patient's pathology revealed intact and MMR proteins indicating low probability of MSI-H. Because of this, immunotherapy is not likely to be helpful. Patient has not taken Regorafenib in nearly 6 weeks, but will initiate his next cycle today. Plan is to take dose reduced Regorafenib 360 for 21 days with 7 day break. Return to clinic in 2 weeks for repeat laboratory work and further evaluation.  2. Pain: Continue percocet as needed as prescribed. 3. Constipation: Improving, continue lactulose and OTC treatments as prescribed.   4. Difficulty sleeping: Ambien has been discontinued and patient was given a prescription for Xanax. 5. Anxiety: Xanax as needed 3 times a day. Patient has refused psychiatry referral for further management of his depression and anxiety. 6. Postop surgical care: Continue follow up with surgery as indicated.  7. Abdominal distention/pain/constipation: Improved. Patient's most recent paracentesis on December 04, 2016 removed 7.5 L of fluid.  8. Right leg pain and numbness: MRI results as above with no distinct metastatic lesion, but venous congestion likely secondary to malignancy. Improves with paracentesis. 9. Weakness/falls: Continue home health as indicated.  Patient expressed understanding and was in agreement with this plan. He also understands that He can call clinic at any time with any questions, concerns, or complaints.    Lloyd Huger, MD 12/20/16 9:43 AM

## 2016-12-15 ENCOUNTER — Inpatient Hospital Stay: Payer: Commercial Managed Care - PPO | Attending: Oncology

## 2016-12-15 ENCOUNTER — Inpatient Hospital Stay (HOSPITAL_BASED_OUTPATIENT_CLINIC_OR_DEPARTMENT_OTHER): Payer: Commercial Managed Care - PPO | Admitting: Oncology

## 2016-12-15 ENCOUNTER — Encounter: Payer: Self-pay | Admitting: *Deleted

## 2016-12-15 VITALS — BP 150/102 | HR 110 | Temp 96.8°F | Resp 18 | Wt 166.2 lb

## 2016-12-15 DIAGNOSIS — R531 Weakness: Secondary | ICD-10-CM | POA: Diagnosis not present

## 2016-12-15 DIAGNOSIS — M79604 Pain in right leg: Secondary | ICD-10-CM

## 2016-12-15 DIAGNOSIS — C182 Malignant neoplasm of ascending colon: Secondary | ICD-10-CM

## 2016-12-15 DIAGNOSIS — C801 Malignant (primary) neoplasm, unspecified: Principal | ICD-10-CM

## 2016-12-15 DIAGNOSIS — R188 Other ascites: Secondary | ICD-10-CM | POA: Diagnosis not present

## 2016-12-15 DIAGNOSIS — F17210 Nicotine dependence, cigarettes, uncomplicated: Secondary | ICD-10-CM | POA: Insufficient documentation

## 2016-12-15 DIAGNOSIS — F419 Anxiety disorder, unspecified: Secondary | ICD-10-CM | POA: Insufficient documentation

## 2016-12-15 DIAGNOSIS — R51 Headache: Secondary | ICD-10-CM | POA: Insufficient documentation

## 2016-12-15 DIAGNOSIS — R5383 Other fatigue: Secondary | ICD-10-CM | POA: Insufficient documentation

## 2016-12-15 DIAGNOSIS — R200 Anesthesia of skin: Secondary | ICD-10-CM | POA: Diagnosis not present

## 2016-12-15 DIAGNOSIS — Z800 Family history of malignant neoplasm of digestive organs: Secondary | ICD-10-CM

## 2016-12-15 DIAGNOSIS — Z79899 Other long term (current) drug therapy: Secondary | ICD-10-CM | POA: Insufficient documentation

## 2016-12-15 DIAGNOSIS — K5900 Constipation, unspecified: Secondary | ICD-10-CM | POA: Insufficient documentation

## 2016-12-15 DIAGNOSIS — R17 Unspecified jaundice: Secondary | ICD-10-CM | POA: Insufficient documentation

## 2016-12-15 DIAGNOSIS — G893 Neoplasm related pain (acute) (chronic): Secondary | ICD-10-CM | POA: Diagnosis not present

## 2016-12-15 DIAGNOSIS — R627 Adult failure to thrive: Secondary | ICD-10-CM | POA: Insufficient documentation

## 2016-12-15 DIAGNOSIS — M5126 Other intervertebral disc displacement, lumbar region: Secondary | ICD-10-CM

## 2016-12-15 DIAGNOSIS — C772 Secondary and unspecified malignant neoplasm of intra-abdominal lymph nodes: Secondary | ICD-10-CM

## 2016-12-15 DIAGNOSIS — R634 Abnormal weight loss: Secondary | ICD-10-CM | POA: Diagnosis not present

## 2016-12-15 DIAGNOSIS — R140 Abdominal distension (gaseous): Secondary | ICD-10-CM

## 2016-12-15 DIAGNOSIS — C787 Secondary malignant neoplasm of liver and intrahepatic bile duct: Secondary | ICD-10-CM | POA: Diagnosis not present

## 2016-12-15 DIAGNOSIS — C786 Secondary malignant neoplasm of retroperitoneum and peritoneum: Secondary | ICD-10-CM | POA: Diagnosis not present

## 2016-12-15 DIAGNOSIS — R590 Localized enlarged lymph nodes: Secondary | ICD-10-CM | POA: Diagnosis not present

## 2016-12-15 DIAGNOSIS — R630 Anorexia: Secondary | ICD-10-CM | POA: Diagnosis not present

## 2016-12-15 LAB — CBC WITH DIFFERENTIAL/PLATELET
BASOS ABS: 0 10*3/uL (ref 0–0.1)
BASOS PCT: 0 %
Eosinophils Absolute: 0.1 10*3/uL (ref 0–0.7)
Eosinophils Relative: 1 %
HEMATOCRIT: 44.3 % (ref 40.0–52.0)
HEMOGLOBIN: 15.2 g/dL (ref 13.0–18.0)
LYMPHS PCT: 23 %
Lymphs Abs: 1.9 10*3/uL (ref 1.0–3.6)
MCH: 27.9 pg (ref 26.0–34.0)
MCHC: 34.2 g/dL (ref 32.0–36.0)
MCV: 81.7 fL (ref 80.0–100.0)
MONO ABS: 0.9 10*3/uL (ref 0.2–1.0)
Monocytes Relative: 10 %
NEUTROS ABS: 5.6 10*3/uL (ref 1.4–6.5)
NEUTROS PCT: 66 %
Platelets: 325 10*3/uL (ref 150–440)
RBC: 5.43 MIL/uL (ref 4.40–5.90)
RDW: 18.6 % — ABNORMAL HIGH (ref 11.5–14.5)
WBC: 8.6 10*3/uL (ref 3.8–10.6)

## 2016-12-15 LAB — COMPREHENSIVE METABOLIC PANEL
ALBUMIN: 2.7 g/dL — AB (ref 3.5–5.0)
ALT: 130 U/L — AB (ref 17–63)
AST: 147 U/L — AB (ref 15–41)
Alkaline Phosphatase: 1198 U/L — ABNORMAL HIGH (ref 38–126)
Anion gap: 8 (ref 5–15)
BILIRUBIN TOTAL: 1.9 mg/dL — AB (ref 0.3–1.2)
BUN: 19 mg/dL (ref 6–20)
CO2: 27 mmol/L (ref 22–32)
CREATININE: 0.91 mg/dL (ref 0.61–1.24)
Calcium: 8.3 mg/dL — ABNORMAL LOW (ref 8.9–10.3)
Chloride: 99 mmol/L — ABNORMAL LOW (ref 101–111)
GFR calc Af Amer: >60 mL/min (ref >60)
GLUCOSE: 117 mg/dL — AB (ref 65–99)
Potassium: 4.5 mmol/L (ref 3.5–5.1)
Sodium: 134 mmol/L — ABNORMAL LOW (ref 135–145)
TOTAL PROTEIN: 6.9 g/dL (ref 6.5–8.1)

## 2016-12-15 NOTE — Progress Notes (Signed)
States is feeling well today. Continues to have weakness in right leg but is receive PT at home.

## 2016-12-18 ENCOUNTER — Other Ambulatory Visit: Payer: Self-pay | Admitting: *Deleted

## 2016-12-18 DIAGNOSIS — C182 Malignant neoplasm of ascending colon: Secondary | ICD-10-CM

## 2016-12-18 DIAGNOSIS — C772 Secondary and unspecified malignant neoplasm of intra-abdominal lymph nodes: Secondary | ICD-10-CM

## 2016-12-18 DIAGNOSIS — C786 Secondary malignant neoplasm of retroperitoneum and peritoneum: Secondary | ICD-10-CM

## 2016-12-18 DIAGNOSIS — Z7189 Other specified counseling: Secondary | ICD-10-CM

## 2016-12-18 DIAGNOSIS — C801 Malignant (primary) neoplasm, unspecified: Principal | ICD-10-CM

## 2016-12-18 MED ORDER — ALPRAZOLAM 0.5 MG PO TABS: 0.5000 mg | ORAL_TABLET | Freq: Three times a day (TID) | ORAL | 0 refills | Status: AC | PRN

## 2016-12-18 MED ORDER — OXYCODONE-ACETAMINOPHEN 5-325 MG PO TABS: 1.0000 | ORAL_TABLET | ORAL | 0 refills | Status: DC | PRN

## 2016-12-18 NOTE — Telephone Encounter (Signed)
Reports that he fell last night, he states he is fine, just sore. Requesting refills on Oxycodone and Xanax

## 2016-12-30 ENCOUNTER — Telehealth: Payer: Self-pay | Admitting: *Deleted

## 2016-12-30 DIAGNOSIS — C786 Secondary malignant neoplasm of retroperitoneum and peritoneum: Secondary | ICD-10-CM

## 2016-12-30 DIAGNOSIS — C801 Malignant (primary) neoplasm, unspecified: Principal | ICD-10-CM

## 2016-12-30 NOTE — Telephone Encounter (Signed)
Patient requesting to have fluid drawn off abdomin as soon as possible.

## 2016-12-30 NOTE — Telephone Encounter (Signed)
Paracentesis scheduled for 4/18 at 2:30pm, pt to arrive at 2pm. Pt has been made aware of appt.

## 2016-12-30 NOTE — Telephone Encounter (Addendum)
OK per Dr Grayland Ormond to schedule Paracentesis for when they can get him in. Haley informed

## 2016-12-30 NOTE — Discharge Instructions (Signed)
Paracentesis, Care After  Refer to this sheet in the next few weeks. These instructions provide you with information about caring for yourself after your procedure. Your health care provider may also give you more specific instructions. Your treatment has been planned according to current medical practices, but problems sometimes occur. Call your health care provider if you have any problems or questions after your procedure.  What can I expect after the procedure?  After your procedure, it is common to have a small amount of clear fluid coming from the puncture site.  Follow these instructions at home:  · Return to your normal activities as told by your health care provider. Ask your health care provider what activities are safe for you.  · Take over-the-counter and prescription medicines only as told by your health care provider.  · Do not take baths, swim, or use a hot tub until your health care provider approves.  · Follow instructions from your health care provider about:  ? How to take care of your puncture site.  ? When and how you should change your bandage (dressing).  ? When you should remove your dressing.  · Check your puncture area every day signs of infection. Watch for:  ? Redness, swelling, or pain.  ? Fluid, blood, or pus.  · Keep all follow-up visits as told by your health care provider. This is important.  Contact a health care provider if:  · You have redness, swelling, or pain at your puncture site.  · You start to have more clear fluid coming from your puncture site.  · You have blood or pus coming from your puncture site.  · You have chills.  · You have a fever.  Get help right away if:  · You develop chest pain or shortness of breath.  · You develop increasing pain, discomfort, or swelling in your abdomen.  · You feel dizzy or light-headed or you pass out.  This information is not intended to replace advice given to you by your health care provider. Make sure you discuss any questions you  have with your health care provider.  Document Released: 01/16/2015 Document Revised: 02/07/2016 Document Reviewed: 11/14/2014  Elsevier Interactive Patient Education © 2017 [employer]

## 2016-12-30 NOTE — Telephone Encounter (Signed)
Orders are in and message sent to scheduling.

## 2016-12-31 ENCOUNTER — Ambulatory Visit
Admission: RE | Admit: 2016-12-31 | Discharge: 2016-12-31 | Disposition: A | Discharge status: Home / Self Care | Payer: Commercial Managed Care - PPO | Source: Ambulatory Visit | Attending: Oncology | Admitting: Oncology

## 2016-12-31 DIAGNOSIS — C801 Malignant (primary) neoplasm, unspecified: Secondary | ICD-10-CM | POA: Insufficient documentation

## 2016-12-31 DIAGNOSIS — R188 Other ascites: Secondary | ICD-10-CM | POA: Diagnosis not present

## 2016-12-31 DIAGNOSIS — C786 Secondary malignant neoplasm of retroperitoneum and peritoneum: Secondary | ICD-10-CM | POA: Insufficient documentation

## 2016-12-31 NOTE — Procedures (Signed)
Pre Procedural Dx: Metastatic colon cancer with recurrent ascites Post Procedural Dx: Same  Successful US guided paracentesis yielding 6.1 L of serous ascitic fluid.  EBL: None  Complications: None immediate  Ronny Bacon, MD Pager #: [phone]

## 2017-01-05 ENCOUNTER — Inpatient Hospital Stay: Payer: Commercial Managed Care - PPO

## 2017-01-05 ENCOUNTER — Inpatient Hospital Stay (HOSPITAL_BASED_OUTPATIENT_CLINIC_OR_DEPARTMENT_OTHER): Payer: Commercial Managed Care - PPO | Admitting: Oncology

## 2017-01-05 ENCOUNTER — Inpatient Hospital Stay
Admission: AD | Admit: 2017-01-05 | Discharge: 2017-01-07 | DRG: 435 | Disposition: A | Discharge status: Hospice | Payer: Commercial Managed Care - PPO | Source: Ambulatory Visit | Attending: Internal Medicine | Admitting: Internal Medicine

## 2017-01-05 VITALS — BP 121/89 | HR 106 | Temp 96.9°F | Resp 18 | Wt 154.7 lb

## 2017-01-05 DIAGNOSIS — E43 Unspecified severe protein-calorie malnutrition: Secondary | ICD-10-CM | POA: Diagnosis present

## 2017-01-05 DIAGNOSIS — R64 Cachexia: Secondary | ICD-10-CM | POA: Diagnosis present

## 2017-01-05 DIAGNOSIS — R188 Other ascites: Secondary | ICD-10-CM | POA: Diagnosis not present

## 2017-01-05 DIAGNOSIS — C189 Malignant neoplasm of colon, unspecified: Secondary | ICD-10-CM | POA: Diagnosis present

## 2017-01-05 DIAGNOSIS — C772 Secondary and unspecified malignant neoplasm of intra-abdominal lymph nodes: Principal | ICD-10-CM

## 2017-01-05 DIAGNOSIS — R109 Unspecified abdominal pain: Secondary | ICD-10-CM | POA: Diagnosis present

## 2017-01-05 DIAGNOSIS — Z515 Encounter for palliative care: Secondary | ICD-10-CM | POA: Diagnosis present

## 2017-01-05 DIAGNOSIS — R200 Anesthesia of skin: Secondary | ICD-10-CM

## 2017-01-05 DIAGNOSIS — M79604 Pain in right leg: Secondary | ICD-10-CM | POA: Diagnosis not present

## 2017-01-05 DIAGNOSIS — F17210 Nicotine dependence, cigarettes, uncomplicated: Secondary | ICD-10-CM | POA: Diagnosis not present

## 2017-01-05 DIAGNOSIS — C785 Secondary malignant neoplasm of large intestine and rectum: Secondary | ICD-10-CM | POA: Diagnosis present

## 2017-01-05 DIAGNOSIS — R630 Anorexia: Secondary | ICD-10-CM

## 2017-01-05 DIAGNOSIS — K5900 Constipation, unspecified: Secondary | ICD-10-CM

## 2017-01-05 DIAGNOSIS — C786 Secondary malignant neoplasm of retroperitoneum and peritoneum: Secondary | ICD-10-CM | POA: Diagnosis present

## 2017-01-05 DIAGNOSIS — C182 Malignant neoplasm of ascending colon: Secondary | ICD-10-CM

## 2017-01-05 DIAGNOSIS — Z79899 Other long term (current) drug therapy: Secondary | ICD-10-CM

## 2017-01-05 DIAGNOSIS — G893 Neoplasm related pain (acute) (chronic): Secondary | ICD-10-CM | POA: Diagnosis not present

## 2017-01-05 DIAGNOSIS — C787 Secondary malignant neoplasm of liver and intrahepatic bile duct: Principal | ICD-10-CM | POA: Diagnosis present

## 2017-01-05 DIAGNOSIS — R17 Unspecified jaundice: Secondary | ICD-10-CM | POA: Diagnosis present

## 2017-01-05 DIAGNOSIS — F329 Major depressive disorder, single episode, unspecified: Secondary | ICD-10-CM | POA: Diagnosis not present

## 2017-01-05 DIAGNOSIS — M5126 Other intervertebral disc displacement, lumbar region: Secondary | ICD-10-CM

## 2017-01-05 DIAGNOSIS — R634 Abnormal weight loss: Secondary | ICD-10-CM | POA: Diagnosis not present

## 2017-01-05 DIAGNOSIS — Z9221 Personal history of antineoplastic chemotherapy: Secondary | ICD-10-CM | POA: Diagnosis not present

## 2017-01-05 DIAGNOSIS — Z7189 Other specified counseling: Secondary | ICD-10-CM

## 2017-01-05 DIAGNOSIS — F419 Anxiety disorder, unspecified: Secondary | ICD-10-CM | POA: Diagnosis not present

## 2017-01-05 DIAGNOSIS — Z800 Family history of malignant neoplasm of digestive organs: Secondary | ICD-10-CM | POA: Diagnosis not present

## 2017-01-05 DIAGNOSIS — R5383 Other fatigue: Secondary | ICD-10-CM | POA: Diagnosis not present

## 2017-01-05 DIAGNOSIS — R627 Adult failure to thrive: Secondary | ICD-10-CM | POA: Diagnosis present

## 2017-01-05 DIAGNOSIS — Z66 Do not resuscitate: Secondary | ICD-10-CM | POA: Diagnosis present

## 2017-01-05 DIAGNOSIS — Z6829 Body mass index (BMI) 29.0-29.9, adult: Secondary | ICD-10-CM | POA: Diagnosis not present

## 2017-01-05 DIAGNOSIS — R51 Headache: Secondary | ICD-10-CM | POA: Diagnosis not present

## 2017-01-05 DIAGNOSIS — R590 Localized enlarged lymph nodes: Secondary | ICD-10-CM | POA: Diagnosis not present

## 2017-01-05 DIAGNOSIS — C801 Malignant (primary) neoplasm, unspecified: Secondary | ICD-10-CM

## 2017-01-05 DIAGNOSIS — R531 Weakness: Secondary | ICD-10-CM | POA: Diagnosis not present

## 2017-01-05 DIAGNOSIS — R140 Abdominal distension (gaseous): Secondary | ICD-10-CM

## 2017-01-05 LAB — CBC WITH DIFFERENTIAL/PLATELET
BASOS PCT: 0 %
Basophils Absolute: 0 10*3/uL (ref 0–0.1)
EOS PCT: 1 %
Eosinophils Absolute: 0.1 10*3/uL (ref 0–0.7)
HCT: 36.8 % — ABNORMAL LOW (ref 40.0–52.0)
Hemoglobin: 12.7 g/dL — ABNORMAL LOW (ref 13.0–18.0)
LYMPHS PCT: 12 %
Lymphs Abs: 1.1 10*3/uL (ref 1.0–3.6)
MCH: 30.9 pg (ref 26.0–34.0)
MCHC: 34.6 g/dL (ref 32.0–36.0)
MCV: 89.4 fL (ref 80.0–100.0)
Monocytes Absolute: 1 10*3/uL (ref 0.2–1.0)
Monocytes Relative: 11 %
Neutro Abs: 7.1 10*3/uL — ABNORMAL HIGH (ref 1.4–6.5)
Neutrophils Relative %: 76 %
PLATELETS: 327 10*3/uL (ref 150–440)
RBC: 4.11 MIL/uL — AB (ref 4.40–5.90)
RDW: 19 % — AB (ref 11.5–14.5)
WBC: 9.2 10*3/uL (ref 3.8–10.6)

## 2017-01-05 LAB — COMPREHENSIVE METABOLIC PANEL
ALBUMIN: 2.3 g/dL — AB (ref 3.5–5.0)
ALK PHOS: 2640 U/L — AB (ref 38–126)
ALT: 222 U/L — AB (ref 17–63)
AST: 328 U/L — ABNORMAL HIGH (ref 15–41)
Anion gap: 9 (ref 5–15)
BUN: 14 mg/dL (ref 6–20)
CALCIUM: 8.2 mg/dL — AB (ref 8.9–10.3)
CHLORIDE: 97 mmol/L — AB (ref 101–111)
CO2: 24 mmol/L (ref 22–32)
Creatinine, Ser: 0.4 mg/dL — ABNORMAL LOW (ref 0.61–1.24)
Glucose, Bld: 107 mg/dL — ABNORMAL HIGH (ref 65–99)
Potassium: 3.7 mmol/L (ref 3.5–5.1)
SODIUM: 130 mmol/L — AB (ref 135–145)
Total Bilirubin: 19.3 mg/dL (ref 0.3–1.2)
Total Protein: 6.7 g/dL (ref 6.5–8.1)

## 2017-01-05 MED ORDER — ACETAMINOPHEN 325 MG PO TABS: 650.0000 mg | ORAL_TABLET | Freq: Four times a day (QID) | ORAL | Status: DC | PRN

## 2017-01-05 MED ORDER — ALPRAZOLAM 0.5 MG PO TABS
0.5000 mg | ORAL_TABLET | Freq: Three times a day (TID) | ORAL | Status: DC | PRN
  Administered 2017-01-06 (×2): 0.5 mg via ORAL
  Filled 2017-01-05 (×2): qty 1

## 2017-01-05 MED ORDER — HYDROMORPHONE HCL 1 MG/ML IJ SOLN
2.0000 mg | INTRAMUSCULAR | Status: DC | PRN
  Administered 2017-01-05: 1 mg via INTRAVENOUS
  Administered 2017-01-05: 2 mg via INTRAVENOUS
  Administered 2017-01-05: 1 mg via INTRAVENOUS
  Administered 2017-01-06 (×4): 2 mg via INTRAVENOUS
  Filled 2017-01-05 (×3): qty 2
  Filled 2017-01-05: qty 1
  Filled 2017-01-05: qty 2
  Filled 2017-01-05: qty 1
  Filled 2017-01-05: qty 2

## 2017-01-05 MED ORDER — ONDANSETRON HCL 4 MG PO TABS: 8.0000 mg | ORAL_TABLET | Freq: Two times a day (BID) | ORAL | Status: DC | PRN

## 2017-01-05 MED ORDER — ACETAMINOPHEN 650 MG RE SUPP: 650.0000 mg | Freq: Four times a day (QID) | RECTAL | Status: DC | PRN

## 2017-01-05 MED ORDER — ONDANSETRON HCL 4 MG/2ML IJ SOLN
4.0000 mg | Freq: Four times a day (QID) | INTRAMUSCULAR | Status: DC | PRN
  Administered 2017-01-06: 4 mg via INTRAVENOUS
  Filled 2017-01-05: qty 2

## 2017-01-05 MED ORDER — LACTULOSE 10 GM/15ML PO SOLN: 20.0000 g | Freq: Every day | ORAL | Status: DC | PRN

## 2017-01-05 MED ORDER — POLYETHYLENE GLYCOL 3350 17 G PO PACK
17.0000 g | PACK | Freq: Every day | ORAL | Status: DC
  Administered 2017-01-06: 09:00:00 17 g via ORAL
  Filled 2017-01-05: qty 1

## 2017-01-05 MED ORDER — IOPAMIDOL (ISOVUE-300) INJECTION 61%
15.0000 mL | INTRAVENOUS | Status: AC
  Administered 2017-01-05 (×2): 15 mL via ORAL

## 2017-01-05 MED ORDER — ONDANSETRON HCL 4 MG PO TABS: 4.0000 mg | ORAL_TABLET | Freq: Four times a day (QID) | ORAL | Status: DC | PRN

## 2017-01-05 MED ORDER — IOPAMIDOL (ISOVUE-300) INJECTION 61%
100.0000 mL | Freq: Once | INTRAVENOUS | Status: AC | PRN
  Administered 2017-01-05: 18:00:00 100 mL via INTRAVENOUS

## 2017-01-05 MED ORDER — OXYCODONE-ACETAMINOPHEN 5-325 MG PO TABS: 1.0000 | ORAL_TABLET | ORAL | Status: DC | PRN

## 2017-01-05 MED ORDER — SODIUM CHLORIDE 0.9 % IV SOLN
INTRAVENOUS | Status: DC
  Administered 2017-01-05 – 2017-01-06 (×2): via INTRAVENOUS

## 2017-01-05 MED ORDER — ENOXAPARIN SODIUM 40 MG/0.4ML ~~LOC~~ SOLN
40.0000 mg | SUBCUTANEOUS | Status: DC
  Administered 2017-01-05: 40 mg via SUBCUTANEOUS
  Filled 2017-01-05: qty 0.4

## 2017-01-05 MED ORDER — BISACODYL 5 MG PO TBEC
5.0000 mg | DELAYED_RELEASE_TABLET | Freq: Every day | ORAL | Status: DC
  Administered 2017-01-06: 5 mg via ORAL
  Filled 2017-01-05 (×2): qty 1

## 2017-01-05 NOTE — Progress Notes (Signed)
Complains of abdominal pain that is not well controlled with current pain medication. Had fluid removed with paracentesis but pain persists after fluid drained off abdomen. Pt stated would like some answers today or he will go seek second opinion. Pt appears to be jaundice today.

## 2017-01-05 NOTE — Progress Notes (Signed)
01/05/17 1600  Clinical Encounter Type  Visited With Patient;Family;Patient and family together  Visit Type Follow-up;Other (Comment) (HCPOA)  Referral From Nurse  Spiritual Encounters  Spiritual Needs Literature;Other (Comment) (HCPOA)  CH responded to request for Advanced Directive education; CH met with patient and family and reviewed HCPOA; Patient would  requested that CH return tomorrow to finalize when parents present.  I  4:50 PM   

## 2017-01-05 NOTE — H&P (Signed)
[place] [facility] [place] NAME: Andrew Ibarra    MR#:  [id number]  DATE OF BIRTH:  1966-09-12  DATE OF ADMISSION:  01/05/2017  PRIMARY CARE PHYSICIAN: Nathaneil Canary, PA-C   REQUESTING/REFERRING PHYSICIAN: Rob Bunting MD  CHIEF COMPLAINT:  Abdominal Pain  HISTORY OF PRESENT ILLNESS: Andrew Ibarra  is a 51 y.o. male with a known history of  colon cancer with peritoneal metastasis with went to see his oncologist today for follow-up. Patient has been complaining of having abdominal pain that is sharp in nature. He was evaluated and was noted to have worsening bilirubin. He also recently had paracentesis to remove acetic fluid. The patient otherwise denies any fevers chills no chest pains he complains of sharp epigastric pain. No nausea vomiting or diarrhea.         PAST MEDICAL HISTORY:   Past Medical History:  Diagnosis Date  . Cancer [facility])    colon cancer  . Headache    H/O  . Loosening of tooth    top front  . Shortness of breath dyspnea    on exertion    PAST SURGICAL HISTORY: Past Surgical History:  Procedure Laterality Date  . COLON RESECTION Right 07/16/2016   Procedure: COLON RESECTION;  Surgeon: [name], MD;  Location: [facility];  Service: General;  Laterality: Right;  . COLONOSCOPY WITH PROPOFOL N/A 03/06/2016   Procedure: COLONOSCOPY WITH PROPOFOL;  Surgeon: Lucilla Lame, MD;  Location: [place];  Service: Endoscopy;  Laterality: N/A;  . POLYPECTOMY  03/06/2016   Procedure: POLYPECTOMY;  Surgeon: Lucilla Lame, MD;  Location: [place];  Service: Endoscopy;;  . PORTACATH PLACEMENT Right 03/25/2016   Procedure: INSERTION PORT-A-CATH;  Surgeon: [name], MD;  Location: [facility];  Service: General;  Laterality: Right;  . Right foot surgery  2001    SOCIAL HISTORY:  Social History  Substance Use Topics  . Smoking status: Current Every Day Smoker    Packs/day: 0.50    Years: 35.00    Types:  Cigarettes  . Smokeless tobacco: Never Used     Comment: has more than 30 pack years history  . Alcohol use No    FAMILY HISTORY:  Family History  Problem Relation Age of Onset  . Pancreatic cancer Maternal Grandfather   . Lymphoma Maternal Grandmother   . CAD Father   . Heart disease Father     DRUG ALLERGIES: No Known Allergies  REVIEW OF SYSTEMS:   CONSTITUTIONAL: No fever, fatigue or weakness.  EYES: No blurred or double vision.  EARS, NOSE, AND THROAT: No tinnitus or ear pain.  RESPIRATORY: No cough, shortness of breath, wheezing or hemoptysis.  CARDIOVASCULAR: No chest pain, orthopnea, edema.  GASTROINTESTINAL: No nausea, vomiting, diarrhea or sharp abdominal pain.  GENITOURINARY: No dysuria, hematuria.  ENDOCRINE: No polyuria, nocturia,  HEMATOLOGY: No anemia, easy bruising or bleeding SKIN: No rash or lesion. MUSCULOSKELETAL: No joint pain or arthritis.   NEUROLOGIC: No tingling, numbness, weakness.  PSYCHIATRY: No anxiety or depression.   MEDICATIONS AT HOME:  Prior to Admission medications   Medication Sig Start Date End Date Taking? Authorizing Provider  ALPRAZolam Duanne Moron) 0.5 MG tablet Take 1 tablet (0.5 mg total) by mouth 3 (three) times daily as needed for anxiety. 12/18/16   Lloyd Huger, MD  Bisacodyl (DULCOLAX PO) Take 1 tablet by mouth daily.     Historical Provider, MD  ibuprofen (ADVIL,MOTRIN) 200 MG tablet Take 200-800 mg by  mouth every 6 (six) hours as needed.    Historical Provider, MD  lactulose (CHRONULAC) 10 GM/15ML solution Take 30 mLs (20 g total) by mouth daily as needed for mild constipation. 10/28/16   Lloyd Huger, MD  ondansetron (ZOFRAN) 8 MG tablet Take 1 tablet (8 mg total) by mouth 2 (two) times daily as needed for refractory nausea / vomiting. 06/18/16   Lloyd Huger, MD  oxyCODONE-acetaminophen (PERCOCET/ROXICET) 5-325 MG tablet Take 1 tablet by mouth every 4 (four) hours as needed for severe pain. 12/18/16   Lloyd Huger, MD  polyethylene glycol [facility]) packet Take 17 g by mouth daily. 10/30/16   Daymon Larsen, MD  regorafenib (STIVARGA) 40 MG tablet Take 3 tablets (120 mg total) by mouth daily with breakfast. Take for 21 days then 7 days off. Take with low fat meal. Caution: Chemotherapy. 12/01/16   Lloyd Huger, MD      PHYSICAL EXAMINATION:   VITAL SIGNS: There were no vitals taken for this visit.  GENERAL:  51 y.o.-year-old patient lying in the bed with no acute distress.  EYES: Pupils equal, round, reactive to light and accommodation. No scleral icterus. Extraocular muscles intact.  HEENT: Head atraumatic, normocephalic. Oropharynx and nasopharynx clear.  NECK:  Supple, no jugular venous distention. No thyroid enlargement, no tenderness.  LUNGS: Normal breath sounds bilaterally, no wheezing, rales,rhonchi or crepitation. No use of accessory muscles of respiration.  CARDIOVASCULAR: S1, S2 normal. No murmurs, rubs, or gallops.  ABDOMEN: Soft, epigastric tenderness mildly distended. Bowel sounds present. No organomegaly or mass.  EXTREMITIES: No pedal edema, cyanosis, or clubbing.  NEUROLOGIC: Cranial nerves II through XII are intact. Muscle strength 5/5 in all extremities. Sensation intact. Gait not checked.  PSYCHIATRIC: The patient is alert and oriented x 3.  SKIN: No obvious rash, lesion, or ulcer.   LABORATORY PANEL:   CBC  Recent Labs Lab 01/05/17 1114  WBC 9.2  HGB 12.7*  HCT 36.8*  PLT 327  MCV 89.4  MCH 30.9  MCHC 34.6  RDW 19.0*  LYMPHSABS 1.1  MONOABS 1.0  EOSABS 0.1  BASOSABS 0.0   ------------------------------------------------------------------------------------------------------------------  Chemistries   Recent Labs Lab 01/05/17 1114  NA 130*  K 3.7  CL 97*  CO2 24  GLUCOSE 107*  BUN 14  CREATININE 0.40*  CALCIUM 8.2*  AST 328*  ALT 222*  ALKPHOS 2,640*  BILITOT 19.3*    ------------------------------------------------------------------------------------------------------------------ estimated creatinine clearance is 109.7 mL/min (A) (by C-G formula based on SCr of 0.4 mg/dL (L)). ------------------------------------------------------------------------------------------------------------------ No results for input(s): TSH, T4TOTAL, T3FREE, THYROIDAB in the last 72 hours.  Invalid input(s): FREET3   Coagulation profile No results for input(s): INR, PROTIME in the last 168 hours. ------------------------------------------------------------------------------------------------------------------- No results for input(s): DDIMER in the last 72 hours. -------------------------------------------------------------------------------------------------------------------  Cardiac Enzymes No results for input(s): CKMB, TROPONINI, MYOGLOBIN in the last 168 hours.  Invalid input(s): CK ------------------------------------------------------------------------------------------------------------------ Invalid input(s): POCBNP  ---------------------------------------------------------------------------------------------------------------  Urinalysis    Component Value Date/Time   COLORURINE YELLOW (A) 10/24/2016 0712   APPEARANCEUR CLEAR (A) 10/24/2016 0712   LABSPEC >1.046 (H) 10/24/2016 0712   PHURINE 5.0 10/24/2016 0712   GLUCOSEU NEGATIVE 10/24/2016 0712   HGBUR NEGATIVE 10/24/2016 0712   BILIRUBINUR NEGATIVE 10/24/2016 0712   KETONESUR NEGATIVE 10/24/2016 0712   PROTEINUR NEGATIVE 10/24/2016 0712   NITRITE NEGATIVE 10/24/2016 0712   LEUKOCYTESUR NEGATIVE 10/24/2016 0712     RADIOLOGY: No results found.  EKG: Orders placed or performed during the hospital encounter of  06/10/16  . ED EKG within 10 minutes  . ED EKG within 10 minutes    IMPRESSION AND PLAN: Patient is a 51 year old white male with metastatic colon cancer  1. Abdominal pain with  worsening alkaline phosphatase and elevated bilirubin This could be related to hepatic metastatic disease Patient also has been on a chemotherapy drugs for his cancer oncology evaluating whether this could be the cause I will order a CT scan of the abdomen and pelvis once further take results are available further workup and therapy based on that. Hold STIVARGA  2. Genralized anxiety  Continue alprazolam  3. Colon cancer with metastatic disease patient was previously DO NOT RESUSCITATE now he is full code prognosis is very poor I will last palliative care team to see I will ask his oncology to follow   4. misc: lovenox for dvt proph     All the records are reviewed and case discussed with ED provider. Management plans discussed with the patient, family and they are in agreement.  CODE STATUS: Code Status History    Date Active Date Inactive Code Status Order ID Comments User Context   07/15/2016  3:32 PM 07/23/2016  8:32 PM DNR [id number]  Pershing Proud, NP Inpatient   07/13/2016 10:07 AM 07/15/2016  3:32 PM Full Code [id number]  Hillary Bow, MD ED   02/18/2016  5:16 PM 02/20/2016  5:16 PM Full Code [id number]  Gladstone Lighter, MD Inpatient    Questions for Most Recent Historical Code Status (Order [id number])    Question Answer Comment   In the event of cardiac or respiratory ARREST Do not call a "code blue"    In the event of cardiac or respiratory ARREST Do not perform Intubation, CPR, defibrillation or ACLS    In the event of cardiac or respiratory ARREST Use medication by any route, position, wound care, and other measures to relive pain and suffering. May use oxygen, suction and manual treatment of airway obstruction as needed for comfort.        TOTAL TIME TAKING CARE OF THIS PATIENT: 35 minutes.    Dustin Flock M.D on 01/05/2017 at 1:56 PM  Between 7am to 6pm - Pager - [phone]  After 6pm go to [email or web address] - password [email or web address] [place] [facility]   Office  [phone]  CC: Primary care physician; Nathaneil Canary, PA-C

## 2017-01-05 NOTE — Progress Notes (Signed)
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  INTERVAL HISTORY: Patient returns to clinic today for further evaluation and laboratory work. Over the past 4-5 days his pain has significantly become worse. He has a poor appetite and continues to lose weight. He is recently noticed jaundice as well. He is highly anxious and tearful. His performance status is declining. He has no other neurologic complaints. He denies any fevers or illnesses. He does not complain of shortness of breath, cough, or chest pain today. He has no urinary complaints. Patient feels generally terrible, but offers no further specific complaints today.  REVIEW OF SYSTEMS:   Review of Systems  Constitutional: Positive for malaise/fatigue. Negative for fever and weight loss.  HENT: Negative for congestion.   Respiratory: Negative for cough and shortness of breath.   Cardiovascular: Negative.  Negative for chest pain and leg swelling.  Gastrointestinal: Positive for abdominal pain and constipation. Negative for blood in stool, diarrhea, melena, nausea and vomiting.  Genitourinary: Negative.   Musculoskeletal: Positive for back pain.  Skin: Negative.        Jaundice  Neurological: Positive for weakness. Negative for tremors and headaches.  Psychiatric/Behavioral: Positive for depression. The patient is nervous/anxious.     As per HPI. Otherwise, a complete review of systems is negative.  PAST MEDICAL HISTORY: Past Medical History:  Diagnosis Date  . Cancer [facility])    colon cancer  . Headache    H/O  . Loosening of  tooth    top front  . Shortness of breath dyspnea    on exertion    PAST SURGICAL HISTORY: Past Surgical History:  Procedure Laterality Date  . COLON RESECTION Right 07/16/2016   Procedure: COLON RESECTION;  Surgeon: [name], MD;  Location: [facility];  Service: General;  Laterality: Right;  . COLONOSCOPY WITH PROPOFOL N/A 03/06/2016   Procedure: COLONOSCOPY WITH PROPOFOL;  Surgeon: Lucilla Lame, MD;  Location: [place];  Service: Endoscopy;  Laterality: N/A;  . POLYPECTOMY  03/06/2016   Procedure: POLYPECTOMY;  Surgeon: Lucilla Lame, MD;  Location: [place];  Service: Endoscopy;;  . PORTACATH PLACEMENT Right 03/25/2016   Procedure: INSERTION PORT-A-CATH;  Surgeon: [name], MD;  Location: [facility];  Service: General;  Laterality: Right;  . Right foot surgery  2001    FAMILY HISTORY Family History  Problem Relation Age of Onset  . Pancreatic cancer Maternal Grandfather   . Lymphoma Maternal Grandmother   . CAD Father   . Heart disease Father        ADVANCED DIRECTIVES:    HEALTH MAINTENANCE: Social History  Substance Use Topics  . Smoking status: Current Every Day Smoker    Packs/day: 0.50    Years: 35.00    Types: Cigarettes  . Smokeless tobacco: Never Used     Comment: has more than 30 pack years history  . Alcohol use No     Colonoscopy:  PAP:  Bone density:  Lipid panel:  No Known Allergies  No current facility-administered medications for this visit.    No current outpatient prescriptions on file.   Facility-Administered Medications Ordered in Other Visits  Medication Dose  Route Frequency Provider Last Rate Last Dose  . 0.9 %  sodium chloride infusion   Intravenous Once Lloyd Huger, MD      . acetaminophen (TYLENOL) tablet 650 mg  650 mg Oral Q6H PRN Dustin Flock, MD       Or  . acetaminophen (TYLENOL) suppository 650 mg  650 mg Rectal Q6H PRN Dustin Flock, MD      . ALPRAZolam Duanne Moron) tablet 0.5 mg  0.5 mg Oral  TID PRN Dustin Flock, MD   0.5 mg at 01/06/17 0359  . alum & mag hydroxide-simeth (MAALOX/MYLANTA) 200-200-20 MG/5ML suspension 30 mL  30 mL Oral Q6H PRN Harrie Foreman, MD   30 mL at 01/06/17 0500  . feeding supplement (ENSURE ENLIVE) (ENSURE ENLIVE) liquid 237 mL  237 mL Oral TID BM Fritzi Mandes, MD   237 mL at 01/06/17 1515  . fentaNYL (DURAGESIC - dosed mcg/hr) 50 mcg  50 mcg Transdermal Q72H Earlie Counts, NP   50 mcg at 01/06/17 1733  . heparin lock flush 100 unit/mL  500 Units Intracatheter Once PRN Lloyd Huger, MD      . HYDROmorphone (DILAUDID) tablet 4 mg  4 mg Oral Q2H PRN Earlie Counts, NP   4 mg at 01/06/17 1938  . lactulose (CHRONULAC) 10 GM/15ML solution 20 g  20 g Oral Daily PRN Dustin Flock, MD      . ondansetron (ZOFRAN) tablet 4 mg  4 mg Oral Q6H PRN Dustin Flock, MD       Or  . ondansetron (ZOFRAN) injection 4 mg  4 mg Intravenous Q6H PRN Dustin Flock, MD   4 mg at 01/06/17 1438  . ondansetron (ZOFRAN) tablet 8 mg  8 mg Oral BID PRN Dustin Flock, MD      . polyethylene glycol (MIRALAX / GLYCOLAX) packet 17 g  17 g Oral Daily Dustin Flock, MD   17 g at 01/06/17 0927  . senna (SENOKOT) tablet 17.2 mg  2 tablet Oral BID Earlie Counts, NP      . sodium chloride flush (NS) 0.9 % injection 10 mL  10 mL Intravenous PRN Lloyd Huger, MD   10 mL at 04/09/16 1014    OBJECTIVE: Vitals:   01/05/17 1138  BP: 121/89  Pulse: (!) 106  Resp: 18  Temp: (!) 96.9 F (36.1 C)     Body mass index is 22.85 kg/m.    ECOG FS:2 - Symptomatic, <50% confined to bed  General: Ill-appearing, no acute distress. Eyes: Pink conjunctiva, icteric sclera. Lungs: Clear to auscultation bilaterally. Heart: Regular rate and rhythm. No rubs, murmurs, or gallops. Abdomen: Distended, firm, nontender. Well-healing surgical scar. Musculoskeletal: No edema, cyanosis, or clubbing. Neuro: Alert, answering all questions appropriately. Cranial nerves grossly intact. Skin:  Jaundice. Psych: Normal affect.   LAB RESULTS:  Lab Results  Component Value Date   NA 135 01/06/2017   K 3.6 01/06/2017   CL 101 01/06/2017   CO2 26 01/06/2017   GLUCOSE 109 (H) 01/06/2017   BUN 14 01/06/2017   CREATININE 0.44 (L) 01/06/2017   CALCIUM 8.4 (L) 01/06/2017   PROT 6.3 (L) 01/06/2017   ALBUMIN 2.3 (L) 01/06/2017   AST 372 (H) 01/06/2017   ALT 239 (H) 01/06/2017   ALKPHOS 2,455 (H) 01/06/2017   BILITOT 19.8 (HH) 01/06/2017   GFRNONAA >60 01/06/2017   GFRAA >60 01/06/2017    Lab Results  Component Value Date   WBC 9.7 01/06/2017   NEUTROABS 7.1 (  H) 01/05/2017   HGB 12.1 (L) 01/06/2017   HCT 36.1 (L) 01/06/2017   MCV 92.2 01/06/2017   PLT 288 01/06/2017   Lab Results  Component Value Date   CEA 3.1 02/18/2016     STUDIES: Ct Abdomen Pelvis W Contrast  Result Date: 01/05/2017 CLINICAL DATA:  History of colon cancer with peritoneal metastasis, now with sharp abdominal pain and worsening bilirubin. EXAM: CT ABDOMEN AND PELVIS WITH CONTRAST TECHNIQUE: Multidetector CT imaging of the abdomen and pelvis was performed using the standard protocol following bolus administration of intravenous contrast. CONTRAST:  173m ISOVUE-300 IOPAMIDOL (ISOVUE-300) INJECTION 61% COMPARISON:  CT abdomen pelvis - 10/24/2016 FINDINGS: Lower chest: Limited visualization of the lower thorax demonstrates interval increase in size of now moderate size bilateral effusions with associated worsening subsegmental atelectasis. Punctate (approximately 0.7 cm) nodule within the basilar aspect of the right pleura (coronal image 39, series 5) is unchanged. Normal heart size.  No pericardial effusion. Hepatobiliary: Compared to the 10/2016 examination, findings compatible with worsening hepatic metastatic disease with dominant nodule within the dome of the right lobe of the liver now measuring 2.4 cm (image 14, series 2), previously, 1.9 cm, And dominant nodule with the caudal aspect the right lobe of  liver now measuring 1.1 cm (image 26, series 2, previously, 0.8 cm. Interval development of an approximately 1.4 cm lesion within subcapsular aspect of the posterior segment of the right lobe of the liver (image 17, series 2). Interval development of intrahepatic bili duct dilatation, most severely affecting the left lobe of the liver, the etiology of which is not definitely identified on the present examination fill presumably malignant etiology. Normal appearance of the gallbladder given underdistention. The portal vein appears patent. Grossly unchanged moderate volume intra- abdominal ascites. Pancreas: Normal appearance of the pancreas Spleen: Worsening splenic from metastatic disease with with dominant lesion within the spleen now measuring 1.3 cm in diameter (image 26, series 2), previously, 0.8 cm; dominant lesion within the caudal aspect of the spleen now measuring 1.8 cm (image 31), previously, 1.4 cm and development of an approximately 1 cm lesion within the mid body of the spleen (image 25, series 2). Adrenals/Urinary Tract: There is symmetric enhancement and excretion of the bilateral kidneys. No definite renal stones this postcontrast examination. No definite renal stones this postcontrast examination. No urinary obstruction. Normal appearance the bilateral adrenal glands. Normal appearance of the pancreas given degree of distention. Stomach/Bowel: Stable sequela of distal colonic resection. Rather extensive omental caking is unchanged to minimally progressed in the interval with index conglomeration within the right upper abdominal quadrant measuring approximately 8.5 x 5.0 cm (image 38, series 2). Re- demonstrated wall thickening involving the hepatic flexure of the colon presumably secondary to invasion from the adjacent omental metastatic disease. No definite evidence of enteric obstruction. No pneumoperitoneum, pneumatosis or portal venous gas. Vascular/Lymphatic: Normal caliber the abdominal  aorta. The major branch vessels of the abdominal aorta appear patent on this non CTA examination. 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 Confluent retroperitoneal adenopathy has progressed in the interval now measuring at least 3.6 x 1.6 cm (image 40, series 2, previously, 2.6 x 1.3 cm. Worsening mesenteric lymphadenopathy with index mesenteric lymph node measuring 1.1 cm in greatest short axis diameter (image 39, series 2). Omental nodularity about the caudal aspect of the ventral lower abdominal/pelvic mesenteric (sagittal image 66, series 6) and omental caking most conspicuous within the left upper abdominal quadrant (coronal image 49, series 5), is grossly unchanged. Reproductive: Normal appearance  of the prostate gland. Small amount of fluid seen with the pelvic cul-de-sac. Other: Mild diffuse body wall anasarca. Musculoskeletal: No definite acute or aggressive osseous abnormalities. IMPRESSION: 1. Findings compatible with obvious progression of disease, even compared to recent abdominal CT performed 10/24/2016. 2. Worsening hepatic metastatic disease with interval increase in size of existing hepatic lesions and development of new hepatic lesions. Interval development of intrahepatic biliary duct dilatation, left greater than right, the etiology of which is not definitively identified on the present examination though presumably secondary to development/worsening of a central hepatic metastasis. Further evaluation could be performed with abdominal MRI as indicated. 3. Worsening splenic metastatic disease. 4. Worsening mesenteric adenopathy and now confluent retroperitoneal adenopathy. 5. Grossly unchanged wall thickening involving the hepatic flexure of the colon, presumably secondary to invasion from the adjacent omental metastatic disease. No definite evidence of enteric obstruction. 6. Grossly unchanged omental caking compatible with known history of peritoneal metastasis. 7. Interval increase in now moderate  sized bilateral effusions. 8. Grossly unchanged moderate volume intra- abdominal ascites. Electronically Signed   By: Sandi Mariscal M.D.   On: 01/05/2017 20:40   US Paracentesis  Result Date: 12/31/2016 INDICATION: History of colon cancer with peritoneal carcinomatosis and recurrent symptomatic ascites. Please perform ultrasound-guided paracentesis for therapeutic purposes. EXAM: ULTRASOUND-GUIDED PARACENTESIS COMPARISON:  Ultrasound-guided paracentesis - 12/04/2016 (yielding 7.5 L of peritoneal fluid), 10/06/2016 (yielding 3.6 L of peritoneal fluid) and 03/21/2016 (yielding 5 L of fluid). MEDICATIONS: None. COMPLICATIONS: None immediate. TECHNIQUE: Informed written consent was obtained from the patient after a discussion of the risks, benefits and alternatives to treatment. A timeout was performed prior to the initiation of the procedure. Initial ultrasound scanning demonstrates a moderate to large amount of ascites within the right lower abdominal quadrant. The right lower abdomen was prepped and draped in the usual sterile fashion. 1% lidocaine with epinephrine was used for local anesthesia. An ultrasound image was saved for documentation purposed. An 8 Fr Safe-T-Centesis catheter was introduced. The paracentesis was performed. The catheter was removed and a dressing was applied. The patient tolerated the procedure well without immediate post procedural complication. FINDINGS: A total of approximately 6.1 liters of serous fluid was removed. IMPRESSION: Successful ultrasound-guided paracentesis yielding 6.1 liters of peritoneal fluid. Electronically Signed   By: Sandi Mariscal M.D.   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  PLAN:    1. 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 Further testing of patient's pathology revealed intact and MMR proteins indicating low probability of MSI-H. Because of this, immunotherapy is not likely to be helpful. Patient reinitiated Regorafenib approximately 2 weeks ago. Given his worsening liver enzymes and significantly high bilirubin, there is concern for significant progression of disease. Have recommended admission to hospital for pain control at which time CT can be performed.  2. Pain: Admission to hospital for IV pain medication. 3. Abdominal distention/pain/constipation: Patient's most recent paracentesis on December 04, 2016 removed 7.5 L of fluid.  4. Failure to thrive: Discussed hospice and comfort care measures again.  Patient expressed understanding and was in agreement with this plan. He also understands that He can call clinic at any time with any questions, concerns, or complaints.    Lloyd Huger, MD 01/06/17 9:49 PM

## 2017-01-05 NOTE — Progress Notes (Signed)
Called to access R chest implanted port. Patient refused access to implanted port, but allowed PIV start. PIV started per protocol.

## 2017-01-06 DIAGNOSIS — F329 Major depressive disorder, single episode, unspecified: Secondary | ICD-10-CM

## 2017-01-06 DIAGNOSIS — Z515 Encounter for palliative care: Secondary | ICD-10-CM

## 2017-01-06 DIAGNOSIS — Z79899 Other long term (current) drug therapy: Secondary | ICD-10-CM

## 2017-01-06 DIAGNOSIS — R0602 Shortness of breath: Secondary | ICD-10-CM

## 2017-01-06 DIAGNOSIS — F17210 Nicotine dependence, cigarettes, uncomplicated: Secondary | ICD-10-CM

## 2017-01-06 DIAGNOSIS — R109 Unspecified abdominal pain: Secondary | ICD-10-CM

## 2017-01-06 DIAGNOSIS — Z7189 Other specified counseling: Secondary | ICD-10-CM

## 2017-01-06 DIAGNOSIS — C786 Secondary malignant neoplasm of retroperitoneum and peritoneum: Secondary | ICD-10-CM

## 2017-01-06 DIAGNOSIS — R531 Weakness: Secondary | ICD-10-CM

## 2017-01-06 DIAGNOSIS — Z800 Family history of malignant neoplasm of digestive organs: Secondary | ICD-10-CM

## 2017-01-06 DIAGNOSIS — R17 Unspecified jaundice: Secondary | ICD-10-CM

## 2017-01-06 DIAGNOSIS — R634 Abnormal weight loss: Secondary | ICD-10-CM

## 2017-01-06 DIAGNOSIS — G893 Neoplasm related pain (acute) (chronic): Secondary | ICD-10-CM

## 2017-01-06 DIAGNOSIS — R5383 Other fatigue: Secondary | ICD-10-CM

## 2017-01-06 DIAGNOSIS — F419 Anxiety disorder, unspecified: Secondary | ICD-10-CM

## 2017-01-06 DIAGNOSIS — C182 Malignant neoplasm of ascending colon: Secondary | ICD-10-CM

## 2017-01-06 DIAGNOSIS — R630 Anorexia: Secondary | ICD-10-CM

## 2017-01-06 DIAGNOSIS — K5900 Constipation, unspecified: Secondary | ICD-10-CM

## 2017-01-06 DIAGNOSIS — R140 Abdominal distension (gaseous): Secondary | ICD-10-CM

## 2017-01-06 LAB — COMPREHENSIVE METABOLIC PANEL
ALBUMIN: 2.3 g/dL — AB (ref 3.5–5.0)
ALT: 239 U/L — ABNORMAL HIGH (ref 17–63)
ANION GAP: 8 (ref 5–15)
AST: 372 U/L — AB (ref 15–41)
Alkaline Phosphatase: 2455 U/L — ABNORMAL HIGH (ref 38–126)
BILIRUBIN TOTAL: 19.8 mg/dL — AB (ref 0.3–1.2)
BUN: 14 mg/dL (ref 6–20)
CO2: 26 mmol/L (ref 22–32)
Calcium: 8.4 mg/dL — ABNORMAL LOW (ref 8.9–10.3)
Chloride: 101 mmol/L (ref 101–111)
Creatinine, Ser: 0.44 mg/dL — ABNORMAL LOW (ref 0.61–1.24)
Glucose, Bld: 109 mg/dL — ABNORMAL HIGH (ref 65–99)
POTASSIUM: 3.6 mmol/L (ref 3.5–5.1)
Sodium: 135 mmol/L (ref 135–145)
TOTAL PROTEIN: 6.3 g/dL — AB (ref 6.5–8.1)

## 2017-01-06 LAB — CBC
HEMATOCRIT: 36.1 % — AB (ref 40.0–52.0)
Hemoglobin: 12.1 g/dL — ABNORMAL LOW (ref 13.0–18.0)
MCH: 30.9 pg (ref 26.0–34.0)
MCHC: 33.5 g/dL (ref 32.0–36.0)
MCV: 92.2 fL (ref 80.0–100.0)
Platelets: 288 10*3/uL (ref 150–440)
RBC: 3.92 MIL/uL — ABNORMAL LOW (ref 4.40–5.90)
RDW: 19.1 % — AB (ref 11.5–14.5)
WBC: 9.7 10*3/uL (ref 3.8–10.6)

## 2017-01-06 MED ORDER — HYDROMORPHONE HCL 2 MG PO TABS
4.0000 mg | ORAL_TABLET | ORAL | Status: DC | PRN
  Administered 2017-01-06 – 2017-01-07 (×5): 4 mg via ORAL
  Filled 2017-01-06 (×5): qty 2

## 2017-01-06 MED ORDER — ALUM & MAG HYDROXIDE-SIMETH 200-200-20 MG/5ML PO SUSP
30.0000 mL | Freq: Four times a day (QID) | ORAL | Status: DC | PRN
  Administered 2017-01-06: 30 mL via ORAL
  Filled 2017-01-06: qty 30

## 2017-01-06 MED ORDER — ADULT MULTIVITAMIN W/MINERALS CH
1.0000 | ORAL_TABLET | Freq: Every day | ORAL | Status: DC
Start: 2017-01-06 — End: 2017-01-06
  Administered 2017-01-06: 1 via ORAL
  Filled 2017-01-06: qty 1

## 2017-01-06 MED ORDER — FENTANYL 50 MCG/HR TD PT72
50.0000 ug | MEDICATED_PATCH | TRANSDERMAL | Status: DC
  Administered 2017-01-06: 18:00:00 50 ug via TRANSDERMAL
  Filled 2017-01-06: qty 1

## 2017-01-06 MED ORDER — ENSURE ENLIVE PO LIQD
237.0000 mL | Freq: Three times a day (TID) | ORAL | Status: DC
  Administered 2017-01-06: 237 mL via ORAL

## 2017-01-06 MED ORDER — SENNA 8.6 MG PO TABS: 2.0000 | ORAL_TABLET | Freq: Two times a day (BID) | ORAL | Status: DC

## 2017-01-06 NOTE — Care Management (Signed)
Admitted to this facility with the diagnosis of abdominal pain (colon cancer). Lives with wife, Andrew Ibarra [phone]). Last seen [facility] June 5th 2017, Followed by [facility] x 1 month. No skilled facility. No home oxygen. Does have rolling walker in the home. Takes care of all basic activities of daily living himself, drives. Prescriptions are filled at [facility] in [place] about 2 weeks ago. Decreased appetite since June 2017. Lost over a hundred pounds.  Discussed transitioning from [facility] to a Hospice agency. States "I am ready." Would like to transition to [place]. Andrew Shanks, RN representative updated. Discharge tomorrow per Dr. Fritzi Mandes Shelbie Ammons RN MSN CCM Care Management [phone]

## 2017-01-06 NOTE — Progress Notes (Signed)
Initial Nutrition Assessment  DOCUMENTATION CODES:   Severe malnutrition in context of chronic illness  INTERVENTION:  Provide Ensure Enlive po TID, each supplement provides 350 kcal and 20 grams of protein. Encouraged patient to drink 2-3 bottles per day of an oral nutrition supplement with at least 250-350 kcal and 15-20 grams of protein.  Recommend multivitamin with minerals daily.  Encouraged small, frequent meals of calorie- and protein-dense foods that patient enjoys and can tolerate. Patient became very tearful and started discussing his views on his prognosis - not an appropriate time for full nutrition education.  NUTRITION DIAGNOSIS:   Malnutrition (Severe) related to chronic illness (stage IVb colon cancer on chemotherapy) as evidenced by 40 percent weight loss over 10 months, severe depletion of body fat, severe depletion of muscle mass.  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   -Patient has undergone paracentesis multiple times. On 03/21/2016 5 L fluid removed, on 10/06/2016 3.6 L fluid removed, 12/04/2016 7.5 L fluid removed, 12/31/2016 6.1 L fluid removed. -Per CT Abd/Pelvis 4/23 findings compatible with worsening hepatic metastatic disease (nodule in dome of right lobe of liver now 2.4 cm - previously 1.9 cm in 10/2016). -Now pending PMT consult.  Spoke with patient and his younger sister at bedside. Patient also had many supportive  friends visiting, but they stepped out of room. Patient reports his appetite has been poor off and on since he was diagnosed with cancer in 02/2016. He reports he starts with a good appetite and feels hunger, but when he begins eating he experiencing early satiety and feels bloated. Also endorses abdominal pain and constipation. Reports he can only eat a few bites of food at a time at each meal (tries to eat 3/day). He reports his total daily intake probably only adds up to one small meal. He enjoys chocolate Boost (cannot recall if Original, High Protein, Plus, etc), but does not drink it regularly.   Patient reports his UBW was 260-265 lbs prior to his diagnosis. Per chart patient was 260 lbs on 02/18/2016 and has lost 104 lbs (40% body weight) over 10 months. Current body weight likely falsely elevated in setting of distended abdomen (per CT Abd/Pelvis 4/23 unchanged moderate volume intra-abdominal ascites). Unsure of accuracy of patient's current charted height. On exam he appears much taller than 5\' 1"  (earlier in chart he is 5\' 9"  to 5\' 11" ). Will not use an equation that includes height to estimate needs.  Meal Completion: 0-15% today per chart  Medications reviewed and include: Dulcolax, Miralax, NS @ 50 ml/hr, Xanax PRN, Maalox/Mylanta PRN, Dilaudid PRN.  Labs reviewed: Creatinine 0.44, Alkaline Phosphatase 2455, T Bili 19.8.   Nutrition-Focused physical exam completed. Findings are severe fat depletion, severe muscle depletion, and no edema. Abdomen distended. Skin jaundiced.  Discussed with RN. Patient currently unable to eat for MRI this afternoon.  Diet Order:  Diet regular Room service appropriate? Yes; Fluid consistency: Thin  Skin:  Reviewed, no issues  Last BM:  PTA (  01/03/2017 per chart)  Height:   Ht Readings from Last 1 Encounters:  01/05/17 5\' 1"  (1.549 m)    Weight:   Wt Readings from Last 1 Encounters:  01/05/17 156 lb (70.8 kg)    Ideal Body Weight:  50.9  kg  BMI:  Body mass index is 29.48 kg/m.  Estimated Nutritional Needs:   Kcal:  2125-2480 (30-35 kcal/kg)  Protein:  105-130 grams (1.5-1.8 grams/kg)  Fluid:  2.1 L/day  EDUCATION NEEDS:   Education needs no appropriate at this time (Only discussed small, frequent meals and recommendations for oral nutrition supplements.)  Willey Blade, MS, RD, LDN Pager: [phone] After Hours Pager: [phone]

## 2017-01-06 NOTE — Progress Notes (Signed)
Visit made. Patient was admitted yesterday 4/23 from the [facility] as a current [facility] patient for evaluation of abdominal pain. He has a known history of colon cancer .A CT of the abdomen and pelvis was performed and resulted showed worsening metastatic disease involving the liver, spleen and mesenteric adenopathy. Palliative Medicine was consulted. Patient and his family met with both Palliative Medicine and Oncologist Dr. Grayland Ormond and have chosen to focus on his comfort with hospice services at home. Planned discharge tomorrow 4/25. Writer met in the room with Andrew Ibarra and his wife Andrew Ibarra to initiate education regarding hospice serves, philosophy and team approach to care with good understanding voiced. Questions answered.[name] was alert and interactive through out the visit.  Patient information faxed to referral. [facility] team updated to transition to hospice services. No DME needs at this time. Hospice information and contact number left with Cindy. Will continue to follow through final disposition. Thank you. Flo Shanks RN, BSN, [facility] [place], hospital Liaison [phone] c

## 2017-01-06 NOTE — Consult Note (Signed)
Andrew Ibarra  Telephone:([phone]) [phone] [phone]) [phone]  ID: Andrew Ibarra OB: 1966-06-08  MR#: [id number]  QJJ#:[id number]  Patient Care Team: Gunnar Bulla as PCP - General (Physician Assistant) Clent Jacks, RN as Registered Nurse Clayburn Pert, MD as Consulting Physician (General Surgery)  CHIEF COMPLAINT: Progressive stage IV colon cancer, failure to thrive, intractable pain.  INTERVAL HISTORY: Patient is a 51 year old male who is evaluated in clinic yesterday and found to have a significantly elevated bilirubin as well as increasing liver enzymes. He also had intractable pain. He has a poor appetite and his performance status is significantly declined. His pain has significantly improved since admission. He continues to have a poor appetite. CT scan results reviewed independently revealed significant progression of disease. He is highly anxious. He has no neurologic complaints. He denies any fevers. He denies any chest pain, cough, or shortness of breath. He has abdominal pain and distention. He denies any nausea, vomiting, or diarrhea. He continues to have chronic constipation. He has no urinary complaints. Patient generally feels terrible, but offers no further specific complaints.  REVIEW OF SYSTEMS:   Review of Systems  Constitutional: Positive for malaise/fatigue and weight loss. Negative for fever.  Respiratory: Negative.  Negative for cough and shortness of breath.   Cardiovascular: Negative.  Negative for chest pain and leg swelling.  Gastrointestinal: Positive for abdominal pain, constipation and diarrhea. Negative for nausea and vomiting.  Genitourinary: Negative.   Musculoskeletal: Negative.   Skin:       Jaundice  Neurological: Positive for weakness.  Psychiatric/Behavioral: Positive for depression. The patient is nervous/anxious.     As per HPI. Otherwise, a complete review of systems is negative.  PAST MEDICAL HISTORY: Past  Medical History:  Diagnosis Date  . Cancer [facility])    colon cancer  . Headache    H/O  . Loosening of tooth    top front  . Shortness of breath dyspnea    on exertion    PAST SURGICAL HISTORY: Past Surgical History:  Procedure Laterality Date  . COLON RESECTION Right 07/16/2016   Procedure: COLON RESECTION;  Surgeon: [name], MD;  Location: [facility];  Service: General;  Laterality: Right;  . COLONOSCOPY WITH PROPOFOL N/A 03/06/2016   Procedure: COLONOSCOPY WITH PROPOFOL;  Surgeon: Lucilla Lame, MD;  Location: [place];  Service: Endoscopy;  Laterality: N/A;  . POLYPECTOMY  03/06/2016   Procedure: POLYPECTOMY;  Surgeon: Lucilla Lame, MD;  Location: [place];  Service: Endoscopy;;  . PORTACATH PLACEMENT Right 03/25/2016   Procedure: INSERTION PORT-A-CATH;  Surgeon: [name], MD;  Location: [facility];  Service: General;  Laterality: Right;  . Right foot surgery  2001    FAMILY HISTORY: Family History  Problem Relation Age of Onset  . Pancreatic cancer Maternal Grandfather   . Lymphoma Maternal Grandmother   . CAD Father   . Heart disease Father     ADVANCED DIRECTIVES (Y/N):  @ADVDIR @  HEALTH MAINTENANCE: Social History  Substance Use Topics  . Smoking status: Current Every Day Smoker    Packs/day: 0.50    Years: 35.00    Types: Cigarettes  . Smokeless tobacco: Never Used     Comment: has more than 30 pack years history  . Alcohol use No     Colonoscopy:  PAP:  Bone density:  Lipid panel:  No Known Allergies  Current Facility-Administered Medications  Medication Dose Route Frequency Provider Last Rate Last Dose  . acetaminophen (TYLENOL)  tablet 650 mg  650 mg Oral Q6H PRN Dustin Flock, MD       Or  . acetaminophen (TYLENOL) suppository 650 mg  650 mg Rectal Q6H PRN Dustin Flock, MD      . ALPRAZolam Duanne Moron) tablet 0.5 mg  0.5 mg Oral TID PRN Dustin Flock, MD   0.5 mg at 01/06/17 0359  . alum & mag hydroxide-simeth  (MAALOX/MYLANTA) 200-200-20 MG/5ML suspension 30 mL  30 mL Oral Q6H PRN Harrie Foreman, MD   30 mL at 01/06/17 0500  . feeding supplement (ENSURE ENLIVE) (ENSURE ENLIVE) liquid 237 mL  237 mL Oral TID BM Fritzi Mandes, MD   237 mL at 01/06/17 1515  . fentaNYL (DURAGESIC - dosed mcg/hr) 50 mcg  50 mcg Transdermal Q72H Earlie Counts, NP   50 mcg at 01/06/17 1733  . HYDROmorphone (DILAUDID) tablet 4 mg  4 mg Oral Q2H PRN Earlie Counts, NP   4 mg at 01/06/17 1938  . lactulose (CHRONULAC) 10 GM/15ML solution 20 g  20 g Oral Daily PRN Dustin Flock, MD      . ondansetron (ZOFRAN) tablet 4 mg  4 mg Oral Q6H PRN Dustin Flock, MD       Or  . ondansetron (ZOFRAN) injection 4 mg  4 mg Intravenous Q6H PRN Dustin Flock, MD   4 mg at 01/06/17 1438  . ondansetron (ZOFRAN) tablet 8 mg  8 mg Oral BID PRN Dustin Flock, MD      . polyethylene glycol (MIRALAX / GLYCOLAX) packet 17 g  17 g Oral Daily Dustin Flock, MD   17 g at 01/06/17 0927  . senna (SENOKOT) tablet 17.2 mg  2 tablet Oral BID Earlie Counts, NP       Facility-Administered Medications Ordered in Other Encounters  Medication Dose Route Frequency Provider Last Rate Last Dose  . 0.9 %  sodium chloride infusion   Intravenous Once Lloyd Huger, MD      . heparin lock flush 100 unit/mL  500 Units Intracatheter Once PRN Lloyd Huger, MD      . sodium chloride flush (NS) 0.9 % injection 10 mL  10 mL Intravenous PRN Lloyd Huger, MD   10 mL at 04/09/16 1014    OBJECTIVE: Vitals:   01/06/17 0410 01/06/17 1938  BP: 120/87 140/88  Pulse: 90 86  Resp: 18   Temp: 97.6 F (36.4 C) 97.5 F (36.4 C)     Body mass index is 29.48 kg/m.    ECOG FS:2 - Symptomatic, <50% confined to bed  General: Ill-appearing, no acute distress. Eyes: Pink conjunctiva, icteric sclera. Lungs: Clear to auscultation bilaterally. Heart: Regular rate and rhythm. No rubs, murmurs, or gallops. Abdomen: Distended, mildly tender Musculoskeletal: No edema,  cyanosis, or clubbing. Neuro: Alert, answering all questions appropriately. Cranial nerves grossly intact. Skin: Jaundiced. Psych: Anxious.   LAB RESULTS:  Lab Results  Component Value Date   NA 135 01/06/2017   K 3.6 01/06/2017   CL 101 01/06/2017   CO2 26 01/06/2017   GLUCOSE 109 (H) 01/06/2017   BUN 14 01/06/2017   CREATININE 0.44 (L) 01/06/2017   CALCIUM 8.4 (L) 01/06/2017   PROT 6.3 (L) 01/06/2017   ALBUMIN 2.3 (L) 01/06/2017   AST 372 (H) 01/06/2017   ALT 239 (H) 01/06/2017   ALKPHOS 2,455 (H) 01/06/2017   BILITOT 19.8 (HH) 01/06/2017   GFRNONAA >60 01/06/2017   GFRAA >60 01/06/2017    Lab Results  Component Value  Date   WBC 9.7 01/06/2017   NEUTROABS 7.1 (H) 01/05/2017   HGB 12.1 (L) 01/06/2017   HCT 36.1 (L) 01/06/2017   MCV 92.2 01/06/2017   PLT 288 01/06/2017     STUDIES: Ct Abdomen Pelvis W Contrast  Result Date: 01/05/2017 CLINICAL DATA:  History of colon cancer with peritoneal metastasis, now with sharp abdominal pain and worsening bilirubin. EXAM: CT ABDOMEN AND PELVIS WITH CONTRAST TECHNIQUE: Multidetector CT imaging of the abdomen and pelvis was performed using the standard protocol following bolus administration of intravenous contrast. CONTRAST:  16mL ISOVUE-300 IOPAMIDOL (ISOVUE-300) INJECTION 61% COMPARISON:  CT abdomen pelvis - 10/24/2016 FINDINGS: Lower chest: Limited visualization of the lower thorax demonstrates interval increase in size of now moderate size bilateral effusions with associated worsening subsegmental atelectasis. Punctate (approximately 0.7 cm) nodule within the basilar aspect of the right pleura (coronal image 39, series 5) is unchanged. Normal heart size.  No pericardial effusion. Hepatobiliary: Compared to the 10/2016 examination, findings compatible with worsening hepatic metastatic disease with dominant nodule within the dome of the right lobe of the liver now measuring 2.4 cm (image 14, series 2), previously, 1.9 cm, And  dominant nodule with the caudal aspect the right lobe of liver now measuring 1.1 cm (image 26, series 2, previously, 0.8 cm. Interval development of an approximately 1.4 cm lesion within subcapsular aspect of the posterior segment of the right lobe of the liver (image 17, series 2). Interval development of intrahepatic bili duct dilatation, most severely affecting the left lobe of the liver, the etiology of which is not definitely identified on the present examination fill presumably malignant etiology. Normal appearance of the gallbladder given underdistention. The portal vein appears patent. Grossly unchanged moderate volume intra- abdominal ascites. Pancreas: Normal appearance of the pancreas Spleen: Worsening splenic from metastatic disease with with dominant lesion within the spleen now measuring 1.3 cm in diameter (image 26, series 2), previously, 0.8 cm; dominant lesion within the caudal aspect of the spleen now measuring 1.8 cm (image 31), previously, 1.4 cm and development of an approximately 1 cm lesion within the mid body of the spleen (image 25, series 2). Adrenals/Urinary Tract: There is symmetric enhancement and excretion of the bilateral kidneys. No definite renal stones this postcontrast examination. No definite renal stones this postcontrast examination. No urinary obstruction. Normal appearance the bilateral adrenal glands. Normal appearance of the pancreas given degree of distention. Stomach/Bowel: Stable sequela of distal colonic resection. Rather extensive omental caking is unchanged to minimally progressed in the interval with index conglomeration within the right upper abdominal quadrant measuring approximately 8.5 x 5.0 cm (image 38, series 2). Re- demonstrated wall thickening involving the hepatic flexure of the colon presumably secondary to invasion from the adjacent omental metastatic disease. No definite evidence of enteric obstruction. No pneumoperitoneum, pneumatosis or portal venous  gas. Vascular/Lymphatic: Normal caliber the abdominal aorta. The major branch vessels of the abdominal aorta appear patent on this non CTA examination. 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 Confluent retroperitoneal adenopathy has progressed in the interval now measuring at least 3.6 x 1.6 cm (image 40, series 2, previously, 2.6 x 1.3 cm. Worsening mesenteric lymphadenopathy with index mesenteric lymph node measuring 1.1 cm in greatest short axis diameter (image 39, series 2). Omental nodularity about the caudal aspect of the ventral lower abdominal/pelvic mesenteric (sagittal image 66, series 6) and omental caking most conspicuous within the left upper abdominal quadrant (coronal image 49, series 5), is grossly unchanged. Reproductive: Normal appearance of the prostate  gland. Small amount of fluid seen with the pelvic cul-de-sac. Other: Mild diffuse body wall anasarca. Musculoskeletal: No definite acute or aggressive osseous abnormalities. IMPRESSION: 1. Findings compatible with obvious progression of disease, even compared to recent abdominal CT performed 10/24/2016. 2. Worsening hepatic metastatic disease with interval increase in size of existing hepatic lesions and development of new hepatic lesions. Interval development of intrahepatic biliary duct dilatation, left greater than right, the etiology of which is not definitively identified on the present examination though presumably secondary to development/worsening of a central hepatic metastasis. Further evaluation could be performed with abdominal MRI as indicated. 3. Worsening splenic metastatic disease. 4. Worsening mesenteric adenopathy and now confluent retroperitoneal adenopathy. 5. Grossly unchanged wall thickening involving the hepatic flexure of the colon, presumably secondary to invasion from the adjacent omental metastatic disease. No definite evidence of enteric obstruction. 6. Grossly unchanged omental caking compatible with known history of  peritoneal metastasis. 7. Interval increase in now moderate sized bilateral effusions. 8. Grossly unchanged moderate volume intra- abdominal ascites. Electronically Signed   By: Sandi Mariscal M.D.   On: 01/05/2017 20:40   US Paracentesis  Result Date: 12/31/2016 INDICATION: History of colon cancer with peritoneal carcinomatosis and recurrent symptomatic ascites. Please perform ultrasound-guided paracentesis for therapeutic purposes. EXAM: ULTRASOUND-GUIDED PARACENTESIS COMPARISON:  Ultrasound-guided paracentesis - 12/04/2016 (yielding 7.5 L of peritoneal fluid), 10/06/2016 (yielding 3.6 L of peritoneal fluid) and 03/21/2016 (yielding 5 L of fluid). MEDICATIONS: None. COMPLICATIONS: None immediate. TECHNIQUE: Informed written consent was obtained from the patient after a discussion of the risks, benefits and alternatives to treatment. A timeout was performed prior to the initiation of the procedure. Initial ultrasound scanning demonstrates a moderate to large amount of ascites within the right lower abdominal quadrant. The right lower abdomen was prepped and draped in the usual sterile fashion. 1% lidocaine with epinephrine was used for local anesthesia. An ultrasound image was saved for documentation purposed. An 8 Fr Safe-T-Centesis catheter was introduced. The paracentesis was performed. The catheter was removed and a dressing was applied. The patient tolerated the procedure well without immediate post procedural complication. FINDINGS: A total of approximately 6.1 liters of serous fluid was removed. IMPRESSION: Successful ultrasound-guided paracentesis yielding 6.1 liters of peritoneal fluid. Electronically Signed   By: Sandi Mariscal M.D.   On: 12/31/2016 15:45    ASSESSMENT: Progressive stage IV colon cancer, failure to thrive, intractable pain.  PLAN:    1. Progressive stage IV colon cancer: Likely the etiology of the patient's hyperbilirubinemia and elevated liver enzymes. After lengthy discussion with  the patient, he does not wish to pursue any further chemotherapy or treatments. He has agreed to hospice and comfort care. Palliative care has been consulted. Likely discharged home tomorrow. 2. Intractable pain: Improved. Continue current narcotic regimen. Home with hospice as above.  Patient is now DO NOT RESUSCITATE/DO NOT RESUSCITATE.  Appreciate consult, call with questions.  Approximately 60 minutes was spent in discussion of which greater than 50% was consultation.   Lloyd Huger, MD   01/06/2017 9:59 PM

## 2017-01-06 NOTE — Consult Note (Signed)
Consultation Note Date: 01/06/2017   Patient Name: Andrew Ibarra  DOB: 1966-08-10  MRN: [id number]  Age / Sex: 51 y.o., male  PCP: Nathaneil Canary, PA-C Referring Physician: Fritzi Mandes, MD  Reason for Consultation: Establishing goals of care, Pain control and Terminal Care   HPI/Patient Profile: 51 y.o. 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 Workup reveals worsening hepatic metastatic disease. Per discussion with Dr. Grayland Ormond patient has elected to transition to comfort care. Palliative consulted for assistance with GOC and symptom management.  Clinical Assessment and Goals of Care: Met with patient and his parents at bedside.   Patient was emotional stating "this feels like getting diagnosed all over again". He is very clear in his goals of comfort and being at home with his wife, parents and his dog.   We discussed end of life and dying. His biggest fear is being in pain while he dies, and leaving his family members behind. We discussed pain management, and his family gave him spiritual support.   Primary Decision Maker PATIENT    SUMMARY OF RECOMMENDATIONS -Start fentanyl patch 15mg q 72hr (dosing based on 24 hr po morphine opioid equivalents of IV hydromorphone use with 25% cross tolerance reduction) -411mpo hydromorphone q2hr prn breakthrough pain- this will likely be required more frequently until steady state fentanyl is reached -D/C home with hospice    Code Status/Advance Care Planning:  DNR  Additional Recommendations (Limitations, Scope, Preferences):  Full Comfort Care  Prognosis:    < 6 months d/t metastatic cancer with transition to comfort measures only, no further treatment planned  Discharge Planning: Home with Hospice  Primary  Diagnoses: Present on Admission: . Abdominal pain   I have reviewed the medical record, interviewed the patient and family, and examined the patient. The following aspects are pertinent.  Past Medical History:  [facility]osis Date  [facility]  . Headache    H/O  . Loosening of tooth    top front  . Shortness of breath dyspnea    on exertion   Social History   Social History  . Marital status: Married    Spouse name: N/A  . Number of children: N/A  . Years of education: N/A   Social History Main Topics  . Smoking status: Current Every Day Smoker    Packs/day: 0.50    Years: 35.00    Types: Cigarettes  . Smokeless tobacco: Never Used     Comment: has more than 30 pack years history  . Alcohol use No  . Drug use: No  . Sexual activity: Not Asked   Other Topics Concern  . None   Social History Narrative   Independent at baseline. Lives with wife.   Family History  Problem Relation Age of Onset  . Pancreatic cancer Maternal Grandfather   . Lymphoma Maternal Grandmother   . CAD Father   .  Heart disease Father    Scheduled Meds: . feeding supplement (ENSURE ENLIVE)  237 mL Oral TID BM  . fentaNYL  50 mcg Transdermal Q72H  . polyethylene glycol  17 g Oral Daily  . senna  2 tablet Oral BID   Continuous Infusions: PRN Meds:.acetaminophen **OR** acetaminophen, ALPRAZolam, alum & mag hydroxide-simeth, HYDROmorphone, lactulose, ondansetron **OR** ondansetron (ZOFRAN) IV, ondansetron Medications Prior to Admission:  Prior to Admission medications   Medication Sig Start Date End Date Taking? Authorizing Provider  ALPRAZolam Duanne Moron) 0.5 MG tablet Take 1 tablet (0.5 mg total) by mouth 3 (three) times daily as needed for anxiety. 12/18/16   Lloyd Huger, MD  Bisacodyl (DULCOLAX PO) Take 1 tablet by mouth daily.     Historical Provider, MD  ibuprofen (ADVIL,MOTRIN) 200 MG tablet Take 200-800 mg by mouth every 6 (six) hours as needed.    Historical  Provider, MD  lactulose (CHRONULAC) 10 GM/15ML solution Take 30 mLs (20 g total) by mouth daily as needed for mild constipation. 10/28/16   Lloyd Huger, MD  ondansetron (ZOFRAN) 8 MG tablet Take 1 tablet (8 mg total) by mouth 2 (two) times daily as needed for refractory nausea / vomiting. 06/18/16   Lloyd Huger, MD  oxyCODONE-acetaminophen (PERCOCET/ROXICET) 5-325 MG tablet Take 1 tablet by mouth every 4 (four) hours as needed for severe pain. 12/18/16   Lloyd Huger, MD  polyethylene glycol [facility]) packet Take 17 g by mouth daily. 10/30/16   Daymon Larsen, MD  regorafenib (STIVARGA) 40 MG tablet Take 3 tablets (120 mg total) by mouth daily with breakfast. Take for 21 days then 7 days off. Take with low fat meal. Caution: Chemotherapy. 12/01/16   Lloyd Huger, MD   No Known Allergies Review of Systems  Constitutional: Positive for activity change and fatigue.  Cardiovascular: Negative for chest pain.  Gastrointestinal: Positive for abdominal distention, abdominal pain and nausea.  Psychiatric/Behavioral: Negative for sleep disturbance. The patient is nervous/anxious.     Physical Exam  Constitutional: He is oriented to person, place, and time.  cachetic  Eyes: Scleral icterus is present.  Pulmonary/Chest: Effort normal and breath sounds normal.  Neurological: He is alert and oriented to person, place, and time.  Skin:  Jaundice  Psychiatric: He has a normal mood and affect.  Nursing note and vitals reviewed.   Vital Signs: BP 120/87 (BP Location: Left Arm)   Pulse 90   Temp 97.6 F (36.4 C) (Oral)   Resp 18   Ht 5' 1"  (1.549 m)   Wt 70.8 kg (156 lb)   SpO2 97%   BMI 29.48 kg/m  Pain Assessment: 0-10   Pain Score: 10-Worst pain ever   SpO2: SpO2: 97 % O2 Device:SpO2: 97 % O2 Flow Rate: .   IO: Intake/output summary:  Intake/Output Summary (Last 24 hours) at 01/06/17 1640 Last data filed at 01/06/17 0410  Gross per 24 hour  Intake            660.83 ml  Output              300 ml  Net           360.83 ml    LBM: Last BM Date: 01/03/17 Baseline Weight: Weight: 70.8 kg (156 lb) Most recent weight: Weight: 70.8 kg (156 lb)     Palliative Assessment/Data: PPS: 30%     Thank you for this consult. Palliative medicine will continue to follow and assist as needed.  Time In: 1530 Time Out: 1700 Time Total: 80 minutes Greater than 50%  of this time was spent counseling and coordinating care related to the above assessment and plan.  Signed by: Mariana Kaufman, AGNP-C Palliative Medicine    Please contact Palliative Medicine Team phone at [phone] for questions and concerns.  For individual provider: See Shea Evans

## 2017-01-06 NOTE — Progress Notes (Signed)
[facility] [place] NAME: Andrew Ibarra    MR#:  [id number]  DATE OF BIRTH:  1965/10/26  SUBJECTIVE:  Presented with abdominal pain and found to have elevated bilirubin  REVIEW OF SYSTEMS:   Review of Systems  Constitutional: Negative for chills, fever and weight loss.  HENT: Negative for ear discharge, ear pain and nosebleeds.   Eyes: Negative for blurred vision, pain and discharge.  Respiratory: Negative for sputum production, shortness of breath, wheezing and stridor.   Cardiovascular: Negative for chest pain, palpitations, orthopnea and PND.  Gastrointestinal: Positive for abdominal pain. Negative for diarrhea, nausea and vomiting.  Genitourinary: Negative for frequency and urgency.  Musculoskeletal: Negative for back pain and joint pain.  Neurological: Positive for weakness. Negative for sensory change, speech change and focal weakness.  Psychiatric/Behavioral: Negative for depression and hallucinations. The patient is not nervous/anxious.    Tolerating Diet: Tolerating PT:   DRUG ALLERGIES:  No Known Allergies  VITALS:  Blood pressure 140/88, pulse 86, temperature 97.5 F (36.4 C), temperature source Oral, resp. rate 18, height 5\' 1"  (1.549 m), weight 70.8 kg (156 lb), SpO2 97 %.  PHYSICAL EXAMINATION:   Physical Exam  GENERAL:  51 y.o.-year-old patient lying in the bed with no acute distress. Looks ill EYES: Pupils equal, round, reactive to light and accommodation. ++scleral icterus. Extraocular muscles intact.  HEENT: Head atraumatic, normocephalic. Oropharynx and nasopharynx clear. Poor dentition NECK:  Supple, no jugular venous distention. No thyroid enlargement, no tenderness.  LUNGS: Normal breath sounds bilaterally, no wheezing, rales, rhonchi. No use of accessory muscles of respiration.  CARDIOVASCULAR: S1, S2 normal. No murmurs, rubs, or gallops.  ABDOMEN: Soft, nontender, + distended. Bowel sounds present.  No organomegaly or mass.  EXTREMITIES: No cyanosis, clubbing or edema b/l.    NEUROLOGIC: Cranial nerves II through XII are intact. No focal Motor or sensory deficits b/l.   PSYCHIATRIC:  patient is alert and oriented x 3.  SKIN: No obvious rash, lesion, or ulcer.   LABORATORY PANEL:  CBC  Recent Labs Lab 01/06/17 0546  WBC 9.7  HGB 12.1*  HCT 36.1*  PLT 288    Chemistries   Recent Labs Lab 01/06/17 0546  NA 135  K 3.6  CL 101  CO2 26  GLUCOSE 109*  BUN 14  CREATININE 0.44*  CALCIUM 8.4*  AST 372*  ALT 239*  ALKPHOS 2,455*  BILITOT 19.8*   Cardiac Enzymes No results for input(s): TROPONINI in the last 168 hours. RADIOLOGY:  Ct Abdomen Pelvis W Contrast  Result Date: 01/05/2017 CLINICAL DATA:  History of colon cancer with peritoneal metastasis, now with sharp abdominal pain and worsening bilirubin. EXAM: CT ABDOMEN AND PELVIS WITH CONTRAST TECHNIQUE: Multidetector CT imaging of the abdomen and pelvis was performed using the standard protocol following bolus administration of intravenous contrast. CONTRAST:  162mL ISOVUE-300 IOPAMIDOL (ISOVUE-300) INJECTION 61% COMPARISON:  CT abdomen pelvis - 10/24/2016 FINDINGS: Lower chest: Limited visualization of the lower thorax demonstrates interval increase in size of now moderate size bilateral effusions with associated worsening subsegmental atelectasis. Punctate (approximately 0.7 cm) nodule within the basilar aspect of the right pleura (coronal image 39, series 5) is unchanged. Normal heart size.  No pericardial effusion. Hepatobiliary: Compared to the 10/2016 examination, findings compatible with worsening hepatic metastatic disease with dominant nodule within the dome of the right lobe of the liver now measuring 2.4 cm (image 14, series 2), previously, 1.9 cm, And dominant nodule with the caudal  aspect the right lobe of liver now measuring 1.1 cm (image 26, series 2, previously, 0.8 cm. Interval development of an approximately 1.4  cm lesion within subcapsular aspect of the posterior segment of the right lobe of the liver (image 17, series 2). Interval development of intrahepatic bili duct dilatation, most severely affecting the left lobe of the liver, the etiology of which is not definitely identified on the present examination fill presumably malignant etiology. Normal appearance of the gallbladder given underdistention. The portal vein appears patent. Grossly unchanged moderate volume intra- abdominal ascites. Pancreas: Normal appearance of the pancreas Spleen: Worsening splenic from metastatic disease with with dominant lesion within the spleen now measuring 1.3 cm in diameter (image 26, series 2), previously, 0.8 cm; dominant lesion within the caudal aspect of the spleen now measuring 1.8 cm (image 31), previously, 1.4 cm and development of an approximately 1 cm lesion within the mid body of the spleen (image 25, series 2). Adrenals/Urinary Tract: There is symmetric enhancement and excretion of the bilateral kidneys. No definite renal stones this postcontrast examination. No definite renal stones this postcontrast examination. No urinary obstruction. Normal appearance the bilateral adrenal glands. Normal appearance of the pancreas given degree of distention. Stomach/Bowel: Stable sequela of distal colonic resection. Rather extensive omental caking is unchanged to minimally progressed in the interval with index conglomeration within the right upper abdominal quadrant measuring approximately 8.5 x 5.0 cm (image 38, series 2). Re- demonstrated wall thickening involving the hepatic flexure of the colon presumably secondary to invasion from the adjacent omental metastatic disease. No definite evidence of enteric obstruction. No pneumoperitoneum, pneumatosis or portal venous gas. Vascular/Lymphatic: Normal caliber the abdominal aorta. The major branch vessels of the abdominal aorta appear patent on this non CTA examination. 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 Confluent retroperitoneal adenopathy has progressed in the interval now measuring at least 3.6 x 1.6 cm (image 40, series 2, previously, 2.6 x 1.3 cm. Worsening mesenteric lymphadenopathy with index mesenteric lymph node measuring 1.1 cm in greatest short axis diameter (image 39, series 2). Omental nodularity about the caudal aspect of the ventral lower abdominal/pelvic mesenteric (sagittal image 66, series 6) and omental caking most conspicuous within the left upper abdominal quadrant (coronal image 49, series 5), is grossly unchanged. Reproductive: Normal appearance of the prostate gland. Small amount of fluid seen with the pelvic cul-de-sac. Other: Mild diffuse body wall anasarca. Musculoskeletal: No definite acute or aggressive osseous abnormalities. IMPRESSION: 1. Findings compatible with obvious progression of disease, even compared to recent abdominal CT performed 10/24/2016. 2. Worsening hepatic metastatic disease with interval increase in size of existing hepatic lesions and development of new hepatic lesions. Interval development of intrahepatic biliary duct dilatation, left greater than right, the etiology of which is not definitively identified on the present examination though presumably secondary to development/worsening of a central hepatic metastasis. Further evaluation could be performed with abdominal MRI as indicated. 3. Worsening splenic metastatic disease. 4. Worsening mesenteric adenopathy and now confluent retroperitoneal adenopathy. 5. Grossly unchanged wall thickening involving the hepatic flexure of the colon, presumably secondary to invasion from the adjacent omental metastatic disease. No definite evidence of enteric obstruction. 6. Grossly unchanged omental caking compatible with known history of peritoneal metastasis. 7. Interval increase in now moderate sized bilateral effusions. 8. Grossly unchanged moderate volume intra- abdominal ascites. Electronically Signed   By:  Sandi Mariscal M.D.   On: 01/05/2017 20:40   ASSESSMENT AND PLAN:   Clydell Sposito  is a 51 y.o.  male with a known history of  colon cancer with peritoneal metastasis with went to see his oncologist today for follow-up. Patient has been complaining of having abdominal pain that is sharp in nature. He was evaluated and was noted to have worsening bilirubin. He also recently had paracentesis to remove ascitic fluid.  1. Abdominal pain with worsening alkaline phosphatase and elevated bilirubin appears to be related toworsening of hepatic metastatic disease from colon cancer -Patient also has been on a chemotherapy drugs for his cancer  -pt seen by Dr Grayland Ormond who d/w pt and parents about overall worsening cancer. Decision was made to hold of chemo for now and pt goes home with Hospice.  2. Genralized anxiety  Continue alprazolam  3. Colon cancer with metastatic disease -seen by Palliative care and Oncology. Pt is DNR  4. misc: lovenox for dvt proph   Case discussed with Care Management/Social Worker. Management plans discussed with the patient, family and they are in agreement.  CODE STATUS: DNR  DVT Prophylaxis: lovenox  TOTAL TIME TAKING CARE OF THIS PATIENT: *25* minutes.  >50% time spent on counselling and coordination of care with pt, palliative care and oncology  POSSIBLE D/C IN 1* DAYS, DEPENDING ON CLINICAL CONDITION.  Note: This dictation was prepared with Dragon dictation along with smaller phrase technology. Any transcriptional errors that result from this process are unintentional.  Syris Brookens M.D on 01/06/2017 at 7:39 PM  Between 7am to 6pm - Pager - [phone]  After 6pm go to [email or web address] - Proofreader  [facility]  Office  [phone]  CC: Primary care physician; Nathaneil Canary, PA-C

## 2017-01-07 DIAGNOSIS — Z515 Encounter for palliative care: Secondary | ICD-10-CM

## 2017-01-07 DIAGNOSIS — Z7189 Other specified counseling: Secondary | ICD-10-CM

## 2017-01-07 DIAGNOSIS — R17 Unspecified jaundice: Secondary | ICD-10-CM

## 2017-01-07 MED ORDER — HYDROMORPHONE HCL 4 MG PO TABS: 4.0000 mg | ORAL_TABLET | ORAL | 0 refills | Status: DC | PRN

## 2017-01-07 MED ORDER — FENTANYL 50 MCG/HR TD PT72: 50.0000 ug | MEDICATED_PATCH | TRANSDERMAL | 0 refills | Status: AC

## 2017-01-07 MED ORDER — ENSURE ENLIVE PO LIQD: 237.0000 mL | Freq: Three times a day (TID) | ORAL | 12 refills | Status: AC

## 2017-01-07 NOTE — Care Management Note (Signed)
Case Management Note  Patient Details  Name: Andrew Ibarra MRN: [id number] Date of Birth: 1965-12-29  Subjective/Objective:   Discharge                 Action/Plan: Discharge to home today per Dr. Fritzi Mandes. Family will transport. Will be followed by [facility].    Expected Discharge Date:  01/07/17               Expected Discharge Plan:     In-House Referral:     Discharge planning Services   [facility]  Post Acute Care Choice:    Choice offered to:     DME Arranged:    DME Agency:     HH Arranged:    HH Agency:     Status of Service:     If discussed at [employer] [facility] [employer], dates discussed:    Additional Comments:  Shelbie Ammons, RN 01/07/2017, 8:18 AM

## 2017-01-07 NOTE — Discharge Instructions (Signed)
Hospice to follow

## 2017-01-07 NOTE — Progress Notes (Signed)
Patient discharged home prior to writers arrival. Discharge summary faxed to referral. Patient had no DME needs. Signed DNR accompanied patient. Referral made aware of discharge. Thank you. Flo Shanks RN, BSN, [facility] [place], hospital Liaison [phone] c

## 2017-01-07 NOTE — Progress Notes (Signed)
Discharge instructions along with home medications and follow up gone over with patient and wife. Both verbalize that they understood instructions. 3 prescriptions given to patient. IV removed. Pt being discharged home on room air with hospice services, no distress noted. Ammie Dalton, RN

## 2017-01-07 NOTE — Discharge Summary (Signed)
[facility] NAME: Andrew Ibarra    MR#:  [id number]  DATE OF BIRTH:  16-Apr-1966  DATE OF ADMISSION:  01/05/2017 ADMITTING PHYSICIAN: Dustin Flock, MD  DATE OF DISCHARGE: 01/07/2017  PRIMARY CARE PHYSICIAN: Nathaneil Canary, PA-C    ADMISSION DIAGNOSIS:  liver failure progressive colon cancer  DISCHARGE DIAGNOSIS:  Metastatic Colon cancer Hyperbilirubinemia Cancer cachexia and sever protein calorie Malnutrition  SECONDARY DIAGNOSIS:   Past Medical History:  Diagnosis Date  . Cancer [facility])    colon cancer  . Headache    H/O  . Loosening of tooth    top front  . Shortness of breath dyspnea    on exertion    HOSPITAL COURSE:   KennethPerryis a 51 y.o.malewith a known history of colon cancer with peritoneal metastasis with went to see his oncologist today for follow-up. Patient has been complaining of having abdominal pain that is sharp in nature. He was evaluated and was noted to have worsening bilirubin. He also recently had paracentesis to remove ascitic fluid.  1. Abdominal pain with worsening alkaline phosphatase and elevated bilirubin appears to be related toworsening of hepatic metastatic disease from colon cancer -Patient also has been on a chemotherapy drugs for his cancer  -pt seen by Dr Grayland Ormond who d/w pt and parents about overall worsening cancer. Decision was made to hold of chemo for now and pt goes home with Hospice. -fentanyl patch and dilaudid po prn  2. Genralized anxiety Continue alprazolam  3.Colon cancer with metastatic disease -seen by Palliative care and Oncology. Pt is DNR  4. misc: lovenox for dvt proph  Hospice to follow at home CONSULTS OBTAINED:  Treatment Team:  Lloyd Huger, MD  DRUG ALLERGIES:  No Known Allergies  DISCHARGE MEDICATIONS:   Current Discharge Medication List    START taking these medications   Details  feeding supplement,  ENSURE ENLIVE, (ENSURE ENLIVE) LIQD Take 237 mLs by mouth 3 (three) times daily between meals. Qty: 237 mL, Refills: 12    fentaNYL (DURAGESIC - DOSED MCG/HR) 50 MCG/HR Place 1 patch (50 mcg total) onto the skin every 3 (three) days. Qty: 5 patch, Refills: 0    HYDROmorphone (DILAUDID) 4 MG tablet Take 1 tablet (4 mg total) by mouth every 2 (two) hours as needed for severe pain. Qty: 30 tablet, Refills: 0      CONTINUE these medications which have NOT CHANGED   Details  ALPRAZolam (XANAX) 0.5 MG tablet Take 1 tablet (0.5 mg total) by mouth 3 (three) times daily as needed for anxiety. Qty: 45 tablet, Refills: 0   Associated Diagnoses: Peritoneal carcinomatosis ([place]); Cancer of ascending colon metastatic to intra-abdominal lymph node ([place]); Goals of care, counseling/discussion    Bisacodyl (DULCOLAX PO) Take 1 tablet by mouth daily.     lactulose (CHRONULAC) 10 GM/15ML solution Take 30 mLs (20 g total) by mouth daily as needed for mild constipation. Qty: 120 mL, Refills: 0    ondansetron (ZOFRAN) 8 MG tablet Take 1 tablet (8 mg total) by mouth 2 (two) times daily as needed for refractory nausea / vomiting. Qty: 30 tablet, Refills: 3   Associated Diagnoses: Cancer of ascending colon metastatic to intra-abdominal lymph node ([place]); Peritoneal carcinomatosis ([facility])    polyethylene glycol (MIRALAX) packet Take 17 g by mouth daily. Qty: 14 each, Refills: 0      STOP taking these medications     ibuprofen (ADVIL,MOTRIN) 200 MG tablet  oxyCODONE-acetaminophen (PERCOCET/ROXICET) 5-325 MG tablet      regorafenib (STIVARGA) 40 MG tablet         If you experience worsening of your admission symptoms, develop shortness of breath, life threatening emergency, suicidal or homicidal thoughts you must seek medical attention immediately by calling 911 or calling your MD immediately  if symptoms less severe.  You Must read complete instructions/literature along with all the possible adverse  reactions/side effects for all the Medicines you take and that have been prescribed to you. Take any new Medicines after you have completely understood and accept all the possible adverse reactions/side effects.   Please note  You were cared for by a hospitalist during your hospital stay. If you have any questions about your discharge medications or the care you received while you were in the hospital after you are discharged, you can call the unit and asked to speak with the hospitalist on call if the hospitalist that took care of you is not available. Once you are discharged, your primary care physician will handle any further medical issues. Please note that NO REFILLS for any discharge medications will be authorized once you are discharged, as it is imperative that you return to your primary care physician (or establish a relationship with a primary care physician if you do not have one) for your aftercare needs so that they can reassess your need for medications and monitor your lab values. Today   SUBJECTIVE   I want to go home  VITAL SIGNS:  Blood pressure 125/87, pulse 85, temperature 97.7 F (36.5 C), temperature source Oral, resp. rate 18, height 5\' 1"  (1.549 m), weight 70.8 kg (156 lb), SpO2 97 %.  I/O:  No intake or output data in the 24 hours ending 01/07/17 0744  PHYSICAL EXAMINATION:  GENERAL:  51 y.o.-year-old patient lying in the bed with no acute distress. Thin cachectic EYES: Pupils equal, round, reactive to light and accommodation. No scleral icterus. Extraocular muscles intact.  HEENT: Head atraumatic, normocephalic. Oropharynx and nasopharynx clear.  NECK:  Supple, no jugular venous distention. No thyroid enlargement, no tenderness.  LUNGS: Normal breath sounds bilaterally, no wheezing, rales,rhonchi or crepitation. No use of accessory muscles of respiration.  CARDIOVASCULAR: S1, S2 normal. No murmurs, rubs, or gallops.  ABDOMEN: Soft, non-tender, distended. Bowel  sounds present. No organomegaly or mass.  EXTREMITIES: No pedal edema, cyanosis, or clubbing.  NEUROLOGIC: Cranial nerves II through XII are intact. Muscle strength 5/5 in all extremities. Sensation intact. Gait not checked.  PSYCHIATRIC: The patient is alert and oriented x 3.  SKIN: No obvious rash, lesion, or ulcer.   DATA REVIEW:   CBC   Recent Labs Lab 01/06/17 0546  WBC 9.7  HGB 12.1*  HCT 36.1*  PLT 288    Chemistries   Recent Labs Lab 01/06/17 0546  NA 135  K 3.6  CL 101  CO2 26  GLUCOSE 109*  BUN 14  CREATININE 0.44*  CALCIUM 8.4*  AST 372*  ALT 239*  ALKPHOS 2,455*  BILITOT 19.8*    Microbiology Results   No results found for this or any previous visit (from the past 240 hour(s)).  RADIOLOGY:  Ct Abdomen Pelvis W Contrast  Result Date: 01/05/2017 CLINICAL DATA:  History of colon cancer with peritoneal metastasis, now with sharp abdominal pain and worsening bilirubin. EXAM: CT ABDOMEN AND PELVIS WITH CONTRAST TECHNIQUE: Multidetector CT imaging of the abdomen and pelvis was performed using the standard protocol following bolus administration of intravenous contrast.  CONTRAST:  176mL ISOVUE-300 IOPAMIDOL (ISOVUE-300) INJECTION 61% COMPARISON:  CT abdomen pelvis - 10/24/2016 FINDINGS: Lower chest: Limited visualization of the lower thorax demonstrates interval increase in size of now moderate size bilateral effusions with associated worsening subsegmental atelectasis. Punctate (approximately 0.7 cm) nodule within the basilar aspect of the right pleura (coronal image 39, series 5) is unchanged. Normal heart size.  No pericardial effusion. Hepatobiliary: Compared to the 10/2016 examination, findings compatible with worsening hepatic metastatic disease with dominant nodule within the dome of the right lobe of the liver now measuring 2.4 cm (image 14, series 2), previously, 1.9 cm, And dominant nodule with the caudal aspect the right lobe of liver now measuring 1.1 cm  (image 26, series 2, previously, 0.8 cm. Interval development of an approximately 1.4 cm lesion within subcapsular aspect of the posterior segment of the right lobe of the liver (image 17, series 2). Interval development of intrahepatic bili duct dilatation, most severely affecting the left lobe of the liver, the etiology of which is not definitely identified on the present examination fill presumably malignant etiology. Normal appearance of the gallbladder given underdistention. The portal vein appears patent. Grossly unchanged moderate volume intra- abdominal ascites. Pancreas: Normal appearance of the pancreas Spleen: Worsening splenic from metastatic disease with with dominant lesion within the spleen now measuring 1.3 cm in diameter (image 26, series 2), previously, 0.8 cm; dominant lesion within the caudal aspect of the spleen now measuring 1.8 cm (image 31), previously, 1.4 cm and development of an approximately 1 cm lesion within the mid body of the spleen (image 25, series 2). Adrenals/Urinary Tract: There is symmetric enhancement and excretion of the bilateral kidneys. No definite renal stones this postcontrast examination. No definite renal stones this postcontrast examination. No urinary obstruction. Normal appearance the bilateral adrenal glands. Normal appearance of the pancreas given degree of distention. Stomach/Bowel: Stable sequela of distal colonic resection. Rather extensive omental caking is unchanged to minimally progressed in the interval with index conglomeration within the right upper abdominal quadrant measuring approximately 8.5 x 5.0 cm (image 38, series 2). Re- demonstrated wall thickening involving the hepatic flexure of the colon presumably secondary to invasion from the adjacent omental metastatic disease. No definite evidence of enteric obstruction. No pneumoperitoneum, pneumatosis or portal venous gas. Vascular/Lymphatic: Normal caliber the abdominal aorta. The major branch vessels  of the abdominal aorta appear patent on this non CTA examination. 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 Confluent retroperitoneal adenopathy has progressed in the interval now measuring at least 3.6 x 1.6 cm (image 40, series 2, previously, 2.6 x 1.3 cm. Worsening mesenteric lymphadenopathy with index mesenteric lymph node measuring 1.1 cm in greatest short axis diameter (image 39, series 2). Omental nodularity about the caudal aspect of the ventral lower abdominal/pelvic mesenteric (sagittal image 66, series 6) and omental caking most conspicuous within the left upper abdominal quadrant (coronal image 49, series 5), is grossly unchanged. Reproductive: Normal appearance of the prostate gland. Small amount of fluid seen with the pelvic cul-de-sac. Other: Mild diffuse body wall anasarca. Musculoskeletal: No definite acute or aggressive osseous abnormalities. IMPRESSION: 1. Findings compatible with obvious progression of disease, even compared to recent abdominal CT performed 10/24/2016. 2. Worsening hepatic metastatic disease with interval increase in size of existing hepatic lesions and development of new hepatic lesions. Interval development of intrahepatic biliary duct dilatation, left greater than right, the etiology of which is not definitively identified on the present examination though presumably secondary to development/worsening of a central hepatic metastasis.  Further evaluation could be performed with abdominal MRI as indicated. 3. Worsening splenic metastatic disease. 4. Worsening mesenteric adenopathy and now confluent retroperitoneal adenopathy. 5. Grossly unchanged wall thickening involving the hepatic flexure of the colon, presumably secondary to invasion from the adjacent omental metastatic disease. No definite evidence of enteric obstruction. 6. Grossly unchanged omental caking compatible with known history of peritoneal metastasis. 7. Interval increase in now moderate sized bilateral effusions. 8.  Grossly unchanged moderate volume intra- abdominal ascites. Electronically Signed   By: Sandi Mariscal M.D.   On: 01/05/2017 20:40     Management plans discussed with the patient, family and they are in agreement.  CODE STATUS:     Code Status Orders        Start     Ordered   01/06/17 1351  Do not attempt resuscitation (DNR)  Continuous    Question Answer Comment  In the event of cardiac or respiratory ARREST Do not call a "code blue"   In the event of cardiac or respiratory ARREST Do not perform Intubation, CPR, defibrillation or ACLS   In the event of cardiac or respiratory ARREST Use medication by any route, position, wound care, and other measures to relive pain and suffering. May use oxygen, suction and manual treatment of airway obstruction as needed for comfort.      01/06/17 1351    Code Status History    Date Active Date Inactive Code Status Order ID Comments User Context   01/05/2017  2:13 PM 01/06/2017  1:51 PM Full Code [id number]  Dustin Flock, MD Inpatient   07/15/2016  3:32 PM 07/23/2016  8:32 PM DNR [id number]  Pershing Proud, NP Inpatient   07/13/2016 10:07 AM 07/15/2016  3:32 PM Full Code [id number]  Hillary Bow, MD ED   02/18/2016  5:16 PM 02/20/2016  5:16 PM Full Code [id number]  Gladstone Lighter, MD Inpatient      TOTAL TIME TAKING CARE OF THIS PATIENT: *40* minutes.    Jarrah Seher M.D on 01/07/2017 at 7:44 AM  Between 7am to 6pm - Pager - [phone] After 6pm go to [email or web address] - Proofreader  [facility]  Office  [phone]  CC: Primary care physician; Nathaneil Canary, PA-C

## 2017-01-14 ENCOUNTER — Other Ambulatory Visit: Payer: Self-pay | Admitting: *Deleted

## 2017-01-14 DIAGNOSIS — C786 Secondary malignant neoplasm of retroperitoneum and peritoneum: Secondary | ICD-10-CM

## 2017-01-14 DIAGNOSIS — C801 Malignant (primary) neoplasm, unspecified: Secondary | ICD-10-CM

## 2017-01-14 DIAGNOSIS — C182 Malignant neoplasm of ascending colon: Secondary | ICD-10-CM

## 2017-01-14 DIAGNOSIS — C772 Secondary and unspecified malignant neoplasm of intra-abdominal lymph nodes: Secondary | ICD-10-CM

## 2017-01-14 MED ORDER — HYDROMORPHONE HCL 4 MG PO TABS: 4.0000 mg | ORAL_TABLET | ORAL | 0 refills | Status: AC | PRN

## 2017-01-21 ENCOUNTER — Telehealth: Payer: Self-pay | Admitting: *Deleted

## 2017-01-21 NOTE — Telephone Encounter (Signed)
Called to report that patient passed away this morning.

## 2017-02-13 DEATH — deceased

## 2017-03-21 ENCOUNTER — Other Ambulatory Visit: Payer: Self-pay | Admitting: Nurse Practitioner

## 2017-07-14 IMAGING — CT CT ANGIO CHEST
2 of 6 series · 19 of 46 positions shown · IV contrast (APPLIED)
Comparison: None.

CLINICAL DATA: Shortness of breath, heartburn and hiccups

EXAM:
CT ANGIOGRAPHY CHEST WITH CONTRAST
TECHNIQUE: Multidetector CT imaging of the chest was performed using the
standard protocol during bolus administration of intravenous
contrast. Multiplanar CT image reconstructions and MIPs were
obtained to evaluate the vascular anatomy.
CONTRAST:  75 cc of Isovue 370

[Series 5: thins · axial · 0.89mm/px · z∈[-914,-588]mm · 17 of 358 slices shown]
[im 16/358  lung]
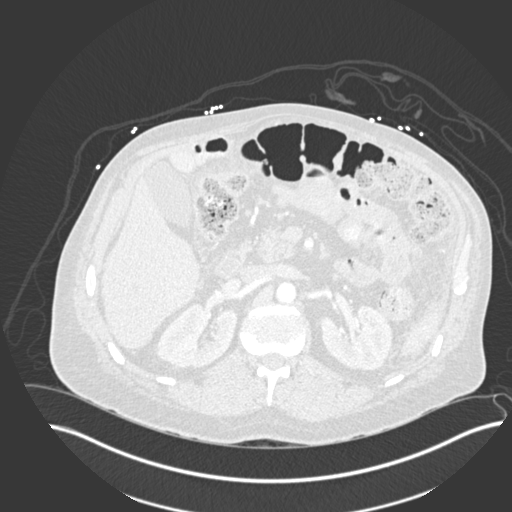
[im 32/358  soft-tissue]
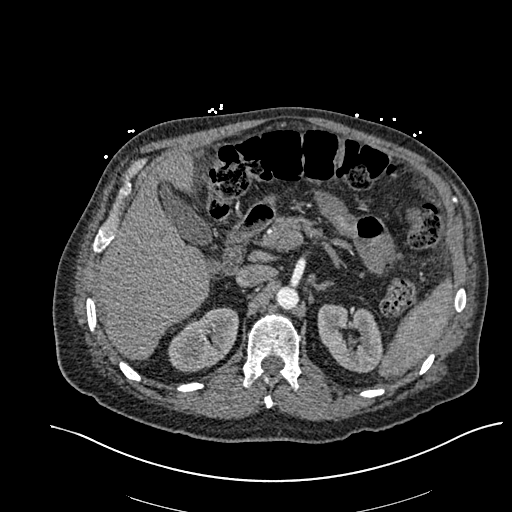
[im 63/358  lung]
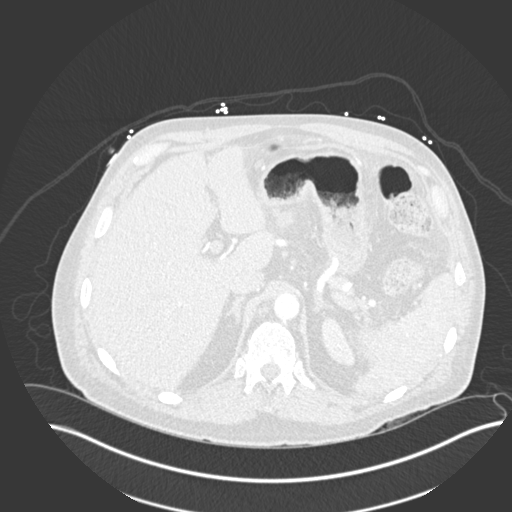
[im 78/358  soft-tissue]
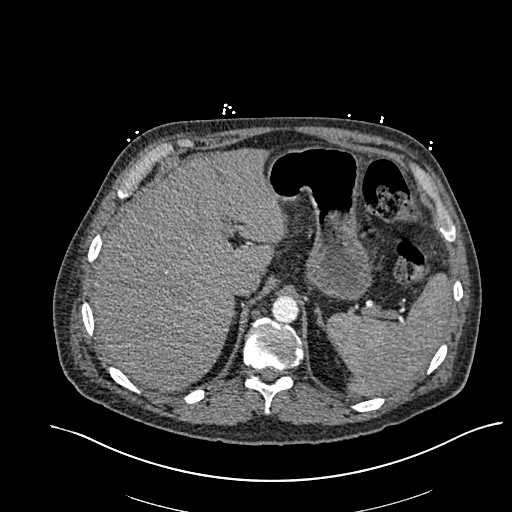
[im 94/358  lung]
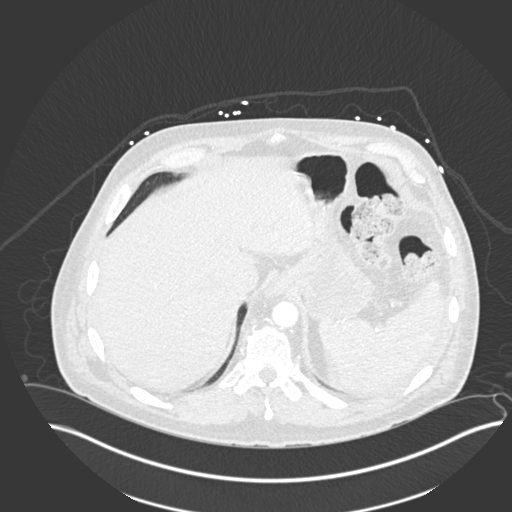
[im 125/358  soft-tissue]
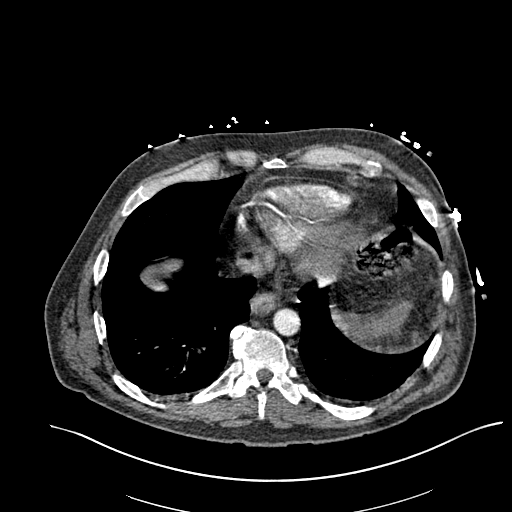
[im 140/358  lung]
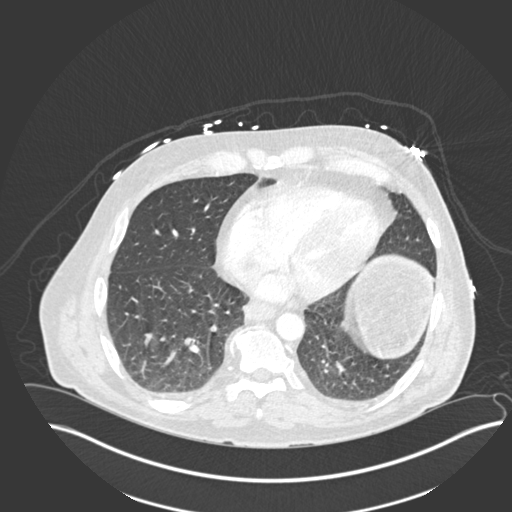
[im 156/358  soft-tissue]
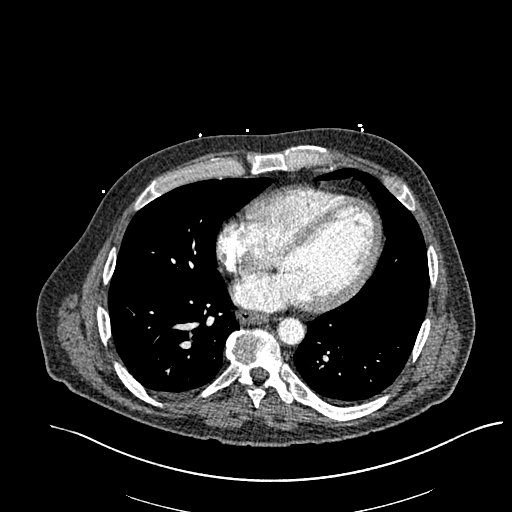
[im 187/358  lung]
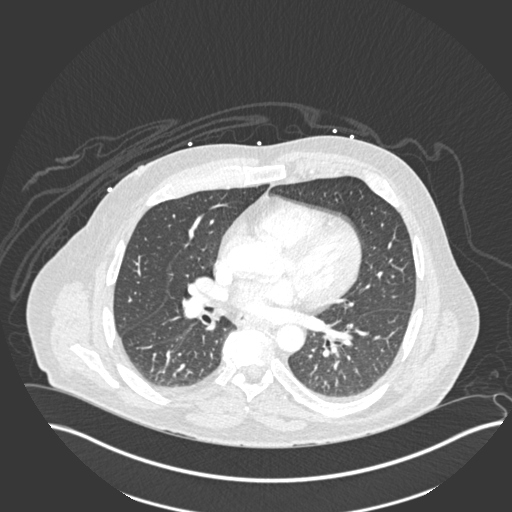
[im 202/358  soft-tissue]
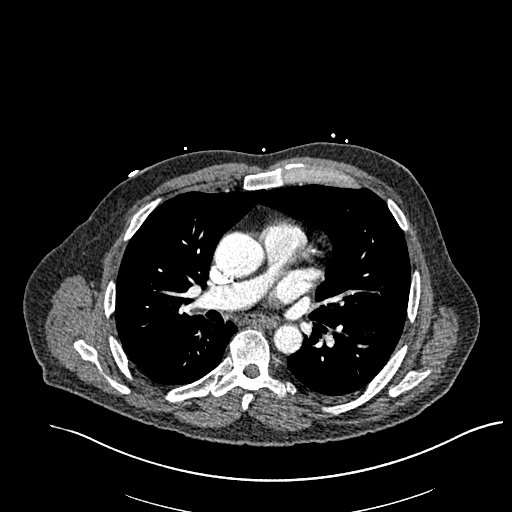
[im 218/358  lung]
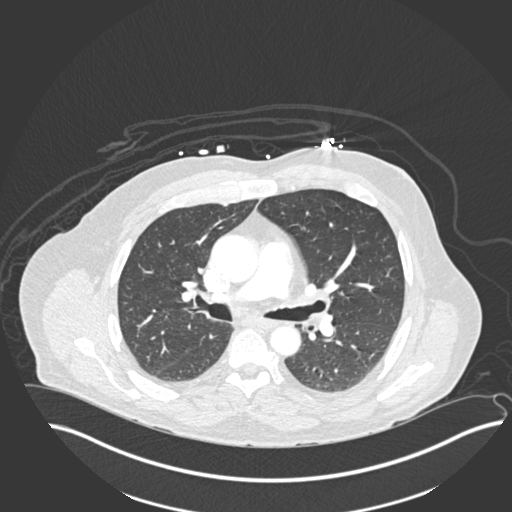
[im 233/358  soft-tissue]
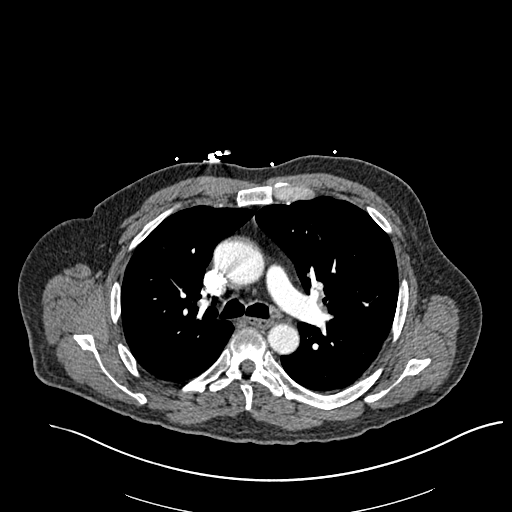
[im 264/358  lung]
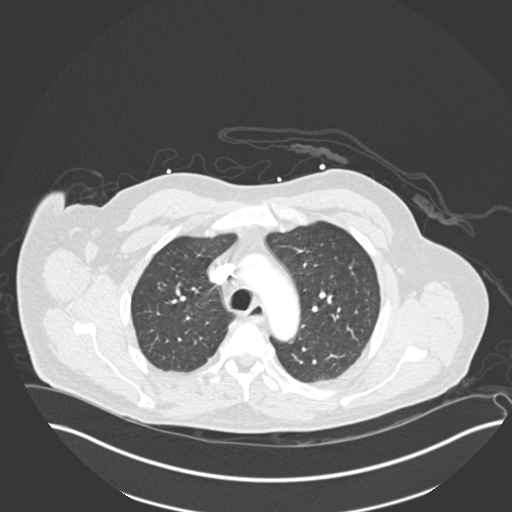
[im 280/358  soft-tissue]
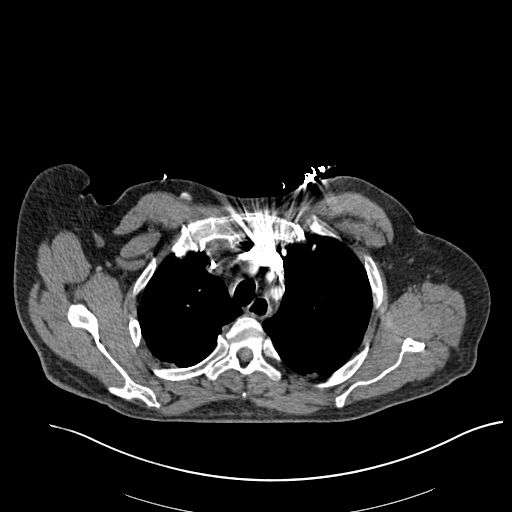
[im 295/358  lung]
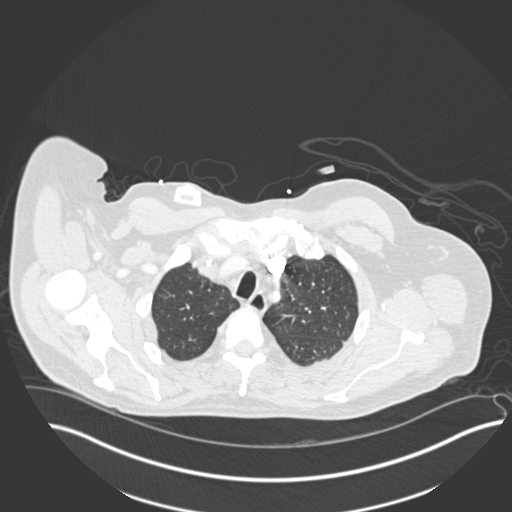
[im 326/358  soft-tissue]
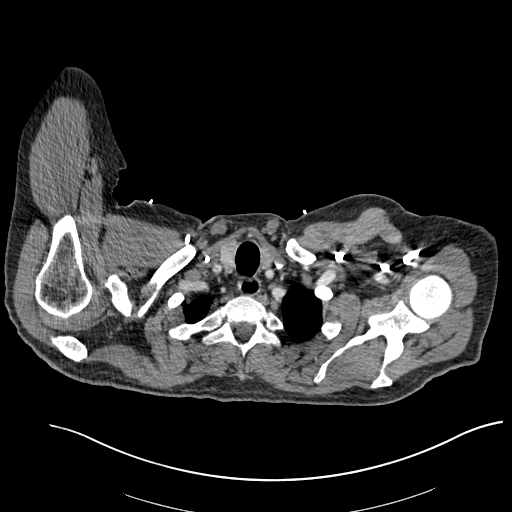
[im 342/358  lung]
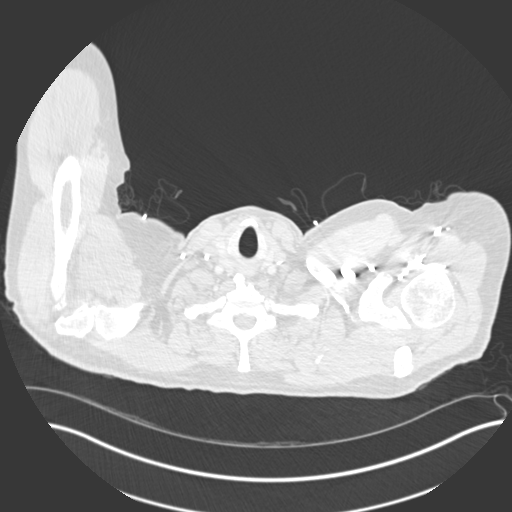

[Series 7: coronal mpr · coronal · 0.70mm/px · 2 of 100 slices shown]
[im 34/100  soft-tissue]
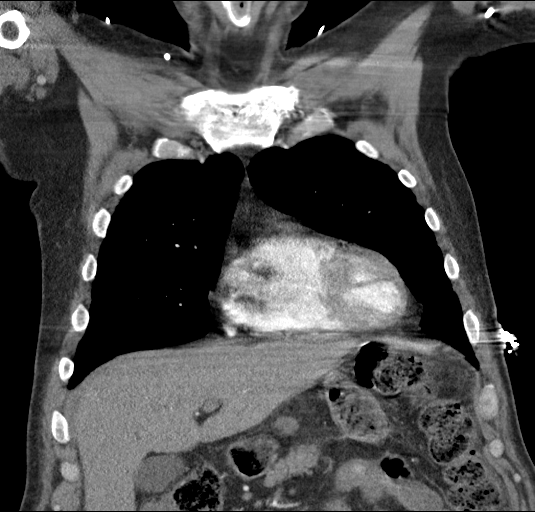
[im 67/100  soft-tissue]
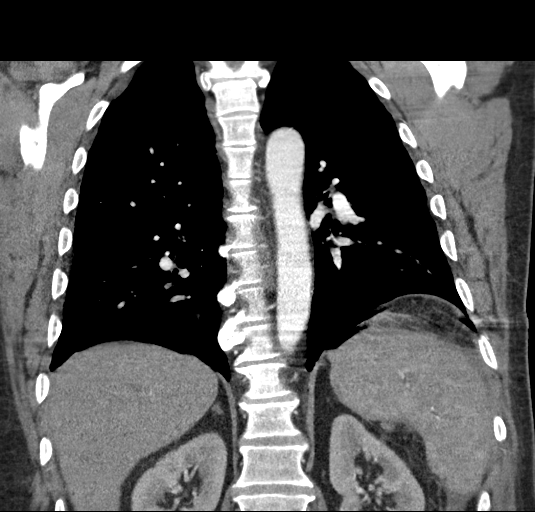

[19 of 46 positions shown; findings below may reference images not displayed]

FINDINGS: Cardiovascular: Satisfactory opacification of the pulmonary arteries
to the segmental level. No evidence of pulmonary embolism. Normal
heart size. No pericardial effusion.

Mediastinum/Nodes: Borderline enlarged left hilar node measures 1
cm, image 49 of series 4. No enlarged mediastinal lymph nodes. No
axillary or supraclavicular adenopathy. Thyroid gland, trachea, and
esophagus demonstrate no significant findings.

Lungs/Pleura: No pleural effusion. No airspace consolidation
identified. No suspicious pulmonary nodule or mass identified.

Upper Abdomen: No acute findings identified within the upper
abdomen. Again noted is evidence of extensive peritoneal
carcinomatosis.

Musculoskeletal: No aggressive lytic or sclerotic bone lesions.

Review of the MIP images confirms the above findings.
IMPRESSION: 1. No acute cardiopulmonary abnormalities and no evidence for acute
pulmonary embolus.
2. Evidence of extensive peritoneal carcinomatosis within the
visualized portions of the upper abdomen.

## 2017-07-14 IMAGING — CR DG CHEST 2V
1 series · 2 of 2 positions shown · non-contrast
Comparison: April 23, 2016

CLINICAL DATA: Shortness of breath

EXAM:
CHEST  2 VIEW

[Series 1: dg chest 2 view · 0.14mm/px · 2 of 2 slices shown]
[im 1/2]
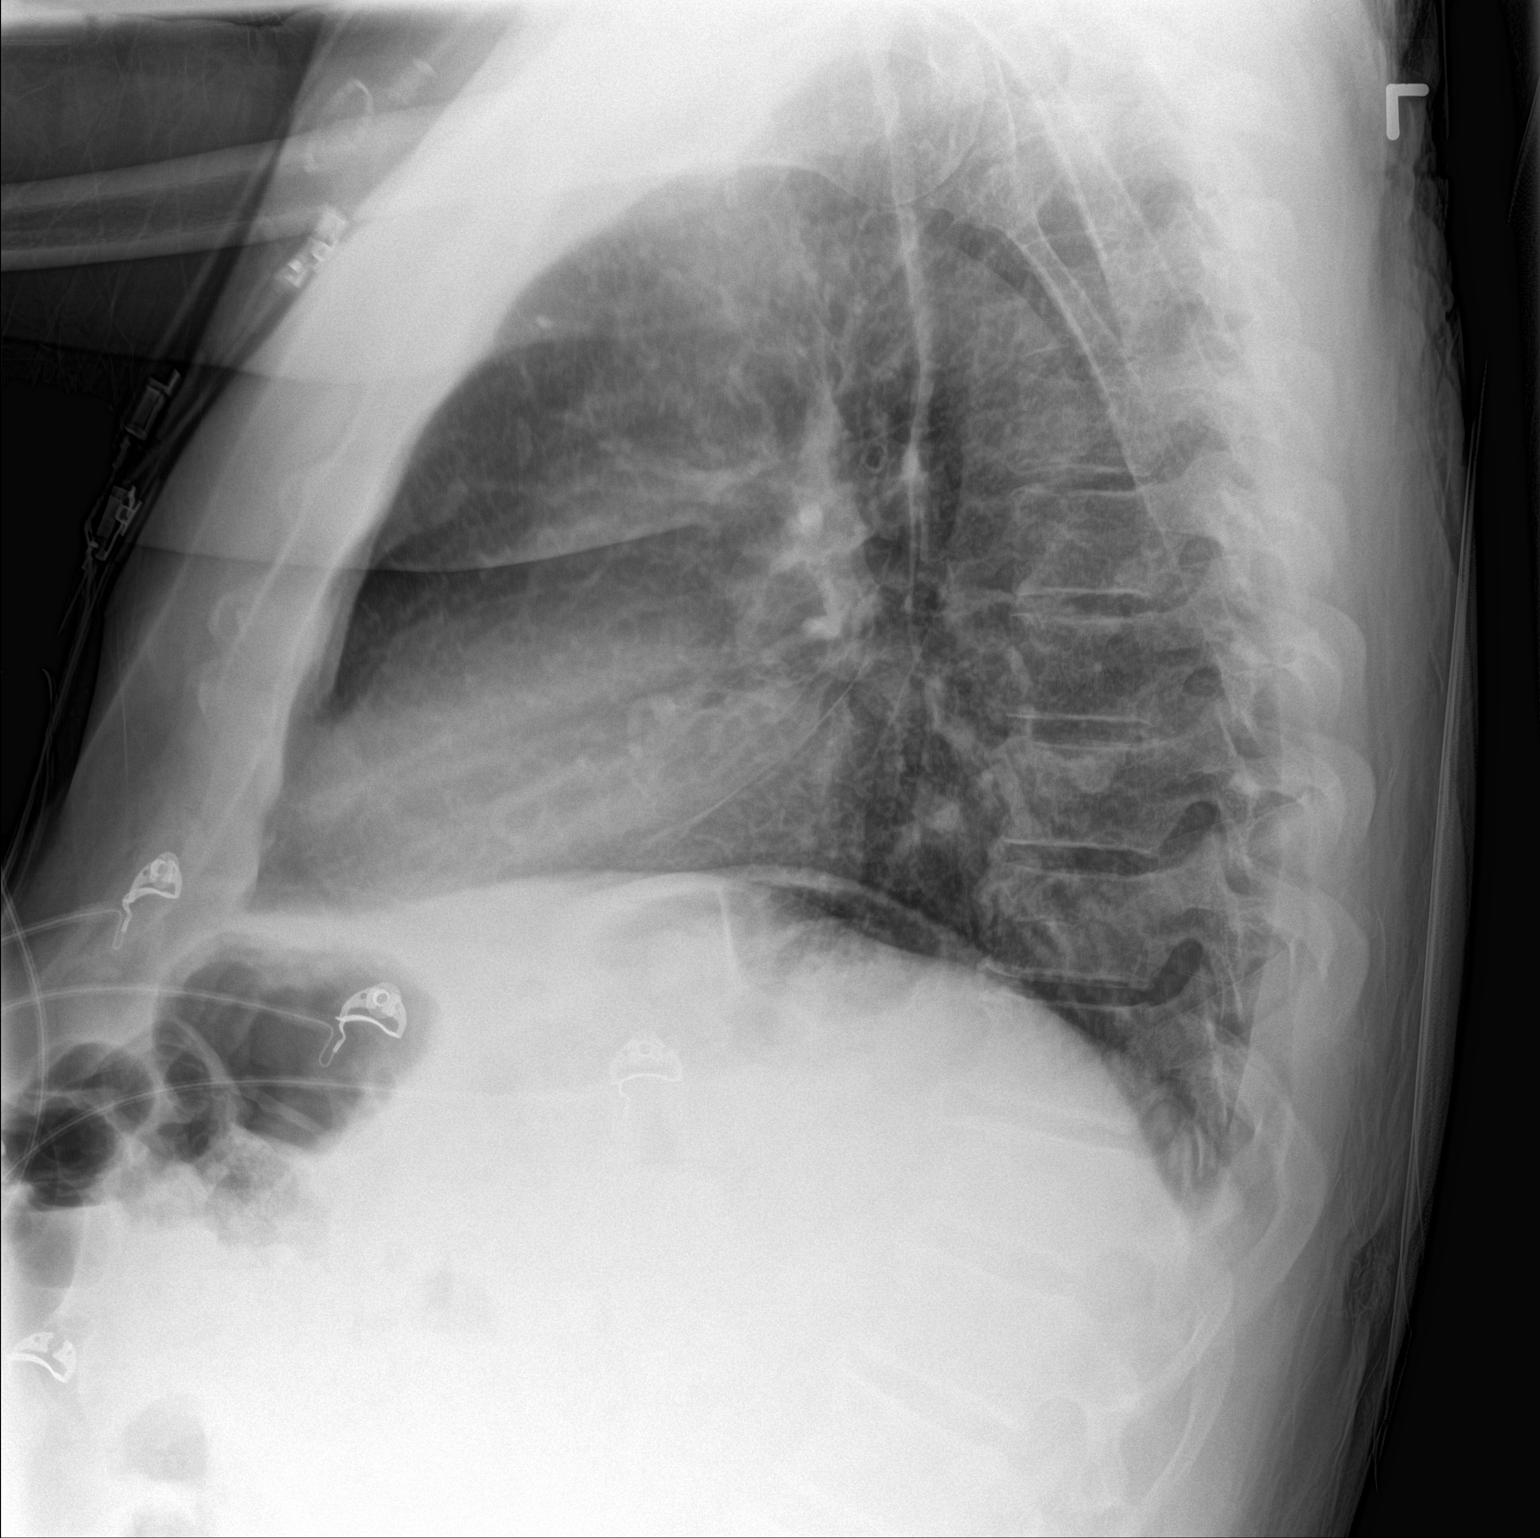
[im 2/2]
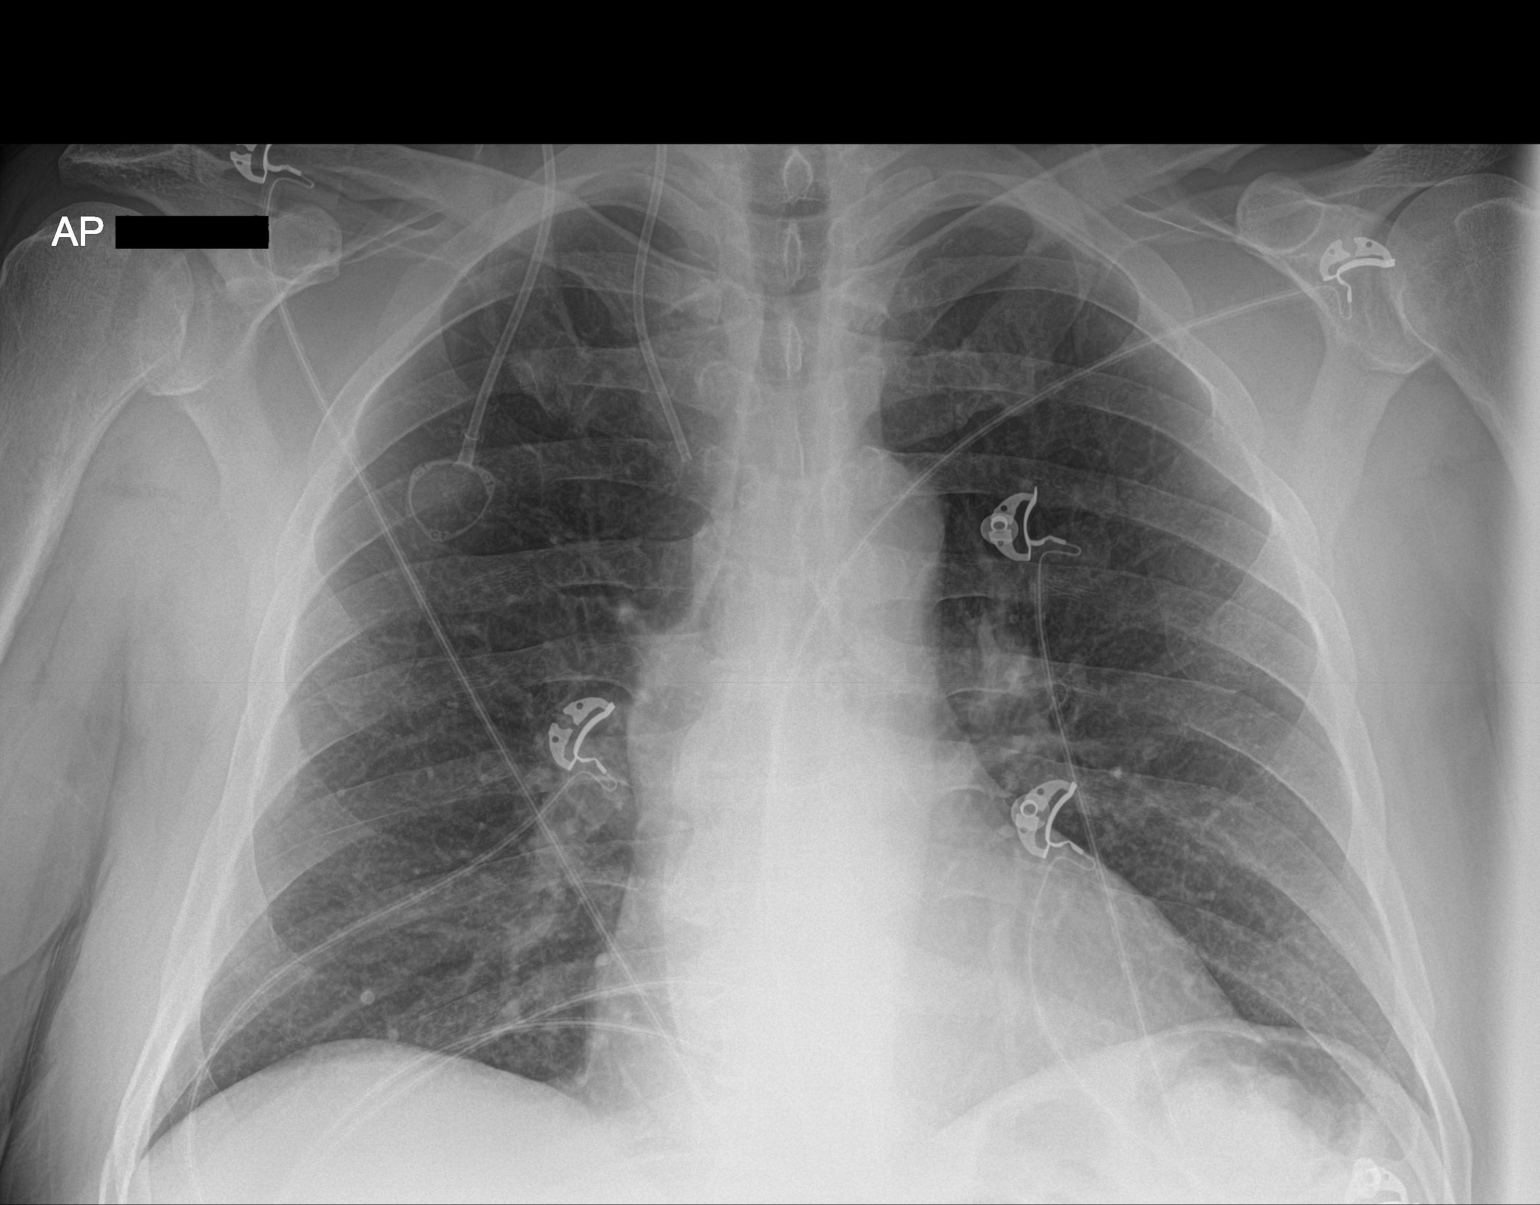

[2 of 2 positions shown; findings below may reference images not displayed]

FINDINGS: Stable right Port-A-Cath. The heart, hila, mediastinum, lungs, and
pleura are otherwise unchanged.
IMPRESSION: No active cardiopulmonary disease.
